# Patient Record
Sex: Male | Born: 1940 | Race: White | Hispanic: No | Marital: Married | Smoking: Former smoker
Health system: Southern US, Community
[De-identification: ages and names within clinical notes are randomized; demographics above are authoritative.]

## PROBLEM LIST (undated history)

## (undated) DIAGNOSIS — I10 Essential (primary) hypertension: Secondary | ICD-10-CM

## (undated) DIAGNOSIS — K635 Polyp of colon: Secondary | ICD-10-CM

## (undated) DIAGNOSIS — N400 Enlarged prostate without lower urinary tract symptoms: Secondary | ICD-10-CM

## (undated) DIAGNOSIS — R7309 Other abnormal glucose: Secondary | ICD-10-CM

## (undated) DIAGNOSIS — M51369 Other intervertebral disc degeneration, lumbar region without mention of lumbar back pain or lower extremity pain: Secondary | ICD-10-CM

## (undated) DIAGNOSIS — E559 Vitamin D deficiency, unspecified: Secondary | ICD-10-CM

## (undated) DIAGNOSIS — H409 Unspecified glaucoma: Secondary | ICD-10-CM

## (undated) HISTORY — DX: Essential (primary) hypertension: I10

## (undated) HISTORY — DX: Benign prostatic hyperplasia without lower urinary tract symptoms: N40.0

## (undated) HISTORY — PX: LAPAROSCOPIC APPENDECTOMY: SUR753

## (undated) HISTORY — DX: Other abnormal glucose: R73.09

## (undated) HISTORY — DX: Vitamin D deficiency, unspecified: E55.9

## (undated) HISTORY — PX: INGUINAL HERNIA REPAIR: SUR1180

## (undated) HISTORY — DX: Unspecified glaucoma: H40.9

## (undated) HISTORY — DX: Other intervertebral disc degeneration, lumbar region without mention of lumbar back pain or lower extremity pain: M51.369

## (undated) HISTORY — DX: Polyp of colon: K63.5

---

## 1997-07-31 HISTORY — PX: CARDIAC CATHETERIZATION: SHX172

## 1997-12-17 ENCOUNTER — Emergency Department (HOSPITAL_COMMUNITY): Admission: EM | Admit: 1997-12-17 | Discharge: 1997-12-17 | Payer: Self-pay | Admitting: Emergency Medicine

## 2000-03-22 ENCOUNTER — Ambulatory Visit (HOSPITAL_COMMUNITY): Admission: RE | Admit: 2000-03-22 | Discharge: 2000-03-22 | Payer: Self-pay | Admitting: *Deleted

## 2002-05-16 ENCOUNTER — Encounter: Payer: Self-pay | Admitting: Internal Medicine

## 2002-05-16 ENCOUNTER — Ambulatory Visit (HOSPITAL_COMMUNITY): Admission: RE | Admit: 2002-05-16 | Discharge: 2002-05-16 | Payer: Self-pay | Admitting: Internal Medicine

## 2005-09-11 ENCOUNTER — Emergency Department (HOSPITAL_COMMUNITY): Admission: EM | Admit: 2005-09-11 | Discharge: 2005-09-11 | Payer: Self-pay | Admitting: Emergency Medicine

## 2009-11-08 ENCOUNTER — Ambulatory Visit (HOSPITAL_COMMUNITY): Admission: RE | Admit: 2009-11-08 | Discharge: 2009-11-08 | Payer: Self-pay | Admitting: Internal Medicine

## 2009-12-06 ENCOUNTER — Ambulatory Visit (HOSPITAL_BASED_OUTPATIENT_CLINIC_OR_DEPARTMENT_OTHER): Admission: RE | Admit: 2009-12-06 | Discharge: 2009-12-06 | Payer: Self-pay | Admitting: Internal Medicine

## 2009-12-06 ENCOUNTER — Ambulatory Visit (HOSPITAL_COMMUNITY): Admission: EM | Admit: 2009-12-06 | Discharge: 2009-12-07 | Payer: Self-pay | Admitting: Emergency Medicine

## 2009-12-06 ENCOUNTER — Encounter (INDEPENDENT_AMBULATORY_CARE_PROVIDER_SITE_OTHER): Payer: Self-pay | Admitting: Surgery

## 2009-12-06 ENCOUNTER — Ambulatory Visit: Payer: Self-pay | Admitting: Diagnostic Radiology

## 2010-10-18 LAB — DIFFERENTIAL
Eosinophils Absolute: 0.1 10*3/uL (ref 0.0–0.7)
Lymphocytes Relative: 19 % (ref 12–46)
Lymphs Abs: 2.2 10*3/uL (ref 0.7–4.0)
Monocytes Relative: 8 % (ref 3–12)
Neutro Abs: 8.6 10*3/uL — ABNORMAL HIGH (ref 1.7–7.7)
Neutrophils Relative %: 72 % (ref 43–77)

## 2010-10-18 LAB — URINALYSIS, ROUTINE W REFLEX MICROSCOPIC
Bilirubin Urine: NEGATIVE
Hgb urine dipstick: NEGATIVE
Ketones, ur: NEGATIVE mg/dL
Nitrite: NEGATIVE
Specific Gravity, Urine: >1.046 — ABNORMAL HIGH (ref 1.005–1.030)
Urobilinogen, UA: 0.2 mg/dL (ref 0.0–1.0)
pH: 5.5 (ref 5.0–8.0)

## 2010-10-18 LAB — COMPREHENSIVE METABOLIC PANEL
CO2: 23 mEq/L (ref 19–32)
Calcium: 8.9 mg/dL (ref 8.4–10.5)
Creatinine, Ser: 1.03 mg/dL (ref 0.4–1.5)
GFR calc non Af Amer: >60 mL/min (ref >60)
Glucose, Bld: 91 mg/dL (ref 70–99)
Total Protein: 7.3 g/dL (ref 6.0–8.3)

## 2010-10-18 LAB — CBC
Hemoglobin: 14.5 g/dL (ref 13.0–17.0)
MCHC: 33.9 g/dL (ref 30.0–36.0)
MCV: 96.9 fL (ref 78.0–100.0)
RBC: 4.41 MIL/uL (ref 4.22–5.81)
RDW: 13.2 % (ref 11.5–15.5)

## 2010-12-20 ENCOUNTER — Encounter: Payer: Self-pay | Admitting: Cardiovascular Disease

## 2010-12-21 ENCOUNTER — Ambulatory Visit: Payer: Self-pay | Admitting: Cardiovascular Disease

## 2011-01-10 ENCOUNTER — Encounter: Payer: Self-pay | Admitting: Cardiovascular Disease

## 2011-01-10 ENCOUNTER — Ambulatory Visit (INDEPENDENT_AMBULATORY_CARE_PROVIDER_SITE_OTHER): Payer: Medicare Other | Admitting: Cardiovascular Disease

## 2011-01-10 VITALS — BP 128/80 | HR 80 | Ht 76.0 in | Wt 250.0 lb

## 2011-01-10 DIAGNOSIS — E782 Mixed hyperlipidemia: Secondary | ICD-10-CM

## 2011-01-10 DIAGNOSIS — I10 Essential (primary) hypertension: Secondary | ICD-10-CM

## 2011-01-10 DIAGNOSIS — R42 Dizziness and giddiness: Secondary | ICD-10-CM

## 2011-01-10 HISTORY — DX: Mixed hyperlipidemia: E78.2

## 2011-01-10 NOTE — Progress Notes (Signed)
70 yo referred by Dr Oneta Rack for dizzyness.  CRF;s HTN and elevated lipids on same Rx for years.  For about 2 months has noted dizzyness especially in a.m.  Not postural.  Has had longstanding "roaring,ringing" in ears.  Has not seen ENT.  Started on Meclizine with no obvious improvement.  No previous history of heart problems.  Home BP readings not low.  No associated SSCP, dyspnea, palpitations, or edema.  Hearing is unchanged.  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  No frank syncope. Compliant with meds.      ROS: Denies fever, malais, weight loss, blurry vision, decreased visual acuity, cough, sputum, SOB, hemoptysis, pleuritic pain, palpitaitons, heartburn, abdominal pain, melena, lower extremity edema, claudication, or rash.  All other systems reviewed and negative   General: Affect appropriate Healthy:  appears stated age HEENT: normal Neck supple with no adenopathy JVP normal no bruits no thyromegaly Lungs clear with no wheezing and good diaphragmatic motion Heart:  S1/S2 no murmur,rub, gallop or click PMI normal Abdomen: benighn, BS positve, no tenderness, no AAA no bruit.  No HSM or HJR Distal pulses intact with no bruits No edema Neuro non-focal Skin warm and dry No muscular weakness  Medications Current Outpatient Prescriptions  Medication Sig Dispense Refill  . aspirin 81 MG EC tablet Take 81 mg by mouth daily.        Marland Kitchen atorvastatin (LIPITOR) 20 MG tablet Take 20 mg by mouth daily.        . Cholecalciferol (VITAMIN D3) 10000 UNITS capsule Take 10,000 Units by mouth daily.        . enalapril (VASOTEC) 10 MG tablet Take 10 mg by mouth daily.        . magnesium oxide (MAG-OX) 400 MG tablet Take 400 mg by mouth daily.        . Multiple Vitamin (MULTIVITAMIN) tablet Take 1 tablet by mouth daily.        . Omega-3 Fatty Acids (FISH OIL) 1200 MG CAPS Take 1 capsule by mouth daily.        . vitamin C (ASCORBIC ACID) 500 MG tablet Take 500 mg by  mouth daily.        . vitamin E 400 UNIT capsule Take 400 Units by mouth daily.          Allergies Review of patient's allergies indicates not on file.  Family History: No family history on file.  Social History: History   Social History  . Marital Status: Married    Spouse Name: N/A    Number of Children: N/A  . Years of Education: N/A   Occupational History  . Not on file.   Social History Main Topics  . Smoking status: Never Smoker   . Smokeless tobacco: Not on file  . Alcohol Use: No  . Drug Use: No  . Sexually Active:    Other Topics Concern  . Not on file   Social History Narrative   NO FAMILY HISTORY IN [place]    Electrocardiogram:  08/09/10 NSR normal ECG rate 51  Assessment and Plan

## 2011-01-10 NOTE — Assessment & Plan Note (Signed)
Not postural.  No associated symptoms with normal ECG and exam.  Stress echo to make sure exercise hemodynamics are normal with no vasodepresser or chronotropic incompetence response.

## 2011-01-10 NOTE — Assessment & Plan Note (Signed)
Well Rx with two drug Rx.  See BP response to exercise

## 2011-01-10 NOTE — Assessment & Plan Note (Signed)
Cholesterol is at goal.  Continue current dose of statin and diet Rx.  No myalgias or side effects.  F/U  LFT's in 6 months. No results found for this basename: LDLCALC  Reviewed labs from primary.  LFTs normal and LDL 97 pm [id number]

## 2011-01-10 NOTE — Patient Instructions (Signed)
Your physician recommends that you schedule a follow-up appointment as needed with Dr. Eden Emms  Your physician has requested that you have a stress echocardiogram. For further information please visit [email or web address] Please follow instruction sheet as given.

## 2011-01-25 ENCOUNTER — Encounter: Payer: Self-pay | Admitting: Cardiovascular Disease

## 2011-01-27 ENCOUNTER — Ambulatory Visit (HOSPITAL_COMMUNITY): Payer: Medicare Other | Admitting: Radiology

## 2011-01-27 ENCOUNTER — Ambulatory Visit (HOSPITAL_COMMUNITY): Payer: Medicare Other | Attending: Cardiovascular Disease | Admitting: Radiology

## 2011-01-27 ENCOUNTER — Ambulatory Visit (HOSPITAL_BASED_OUTPATIENT_CLINIC_OR_DEPARTMENT_OTHER): Payer: Medicare Other | Admitting: Radiology

## 2011-01-27 DIAGNOSIS — R42 Dizziness and giddiness: Secondary | ICD-10-CM | POA: Insufficient documentation

## 2011-01-27 DIAGNOSIS — R0989 Other specified symptoms and signs involving the circulatory and respiratory systems: Secondary | ICD-10-CM

## 2011-01-27 DIAGNOSIS — R55 Syncope and collapse: Secondary | ICD-10-CM

## 2011-11-14 IMAGING — CR DG CERVICAL SPINE COMPLETE 4+V
6 series · 6 of 6 positions shown · non-contrast
Comparison: None.

CLINICAL DATA: Neck pain.  The patient reported to the technologist
that a benign right-sided neck mass was seen on MRI September 2008. No
prior MRIs of the neck are identified in our system for review.

CERVICAL SPINE - COMPLETE 4+ VIEW

[view not recorded (1 of 6)]
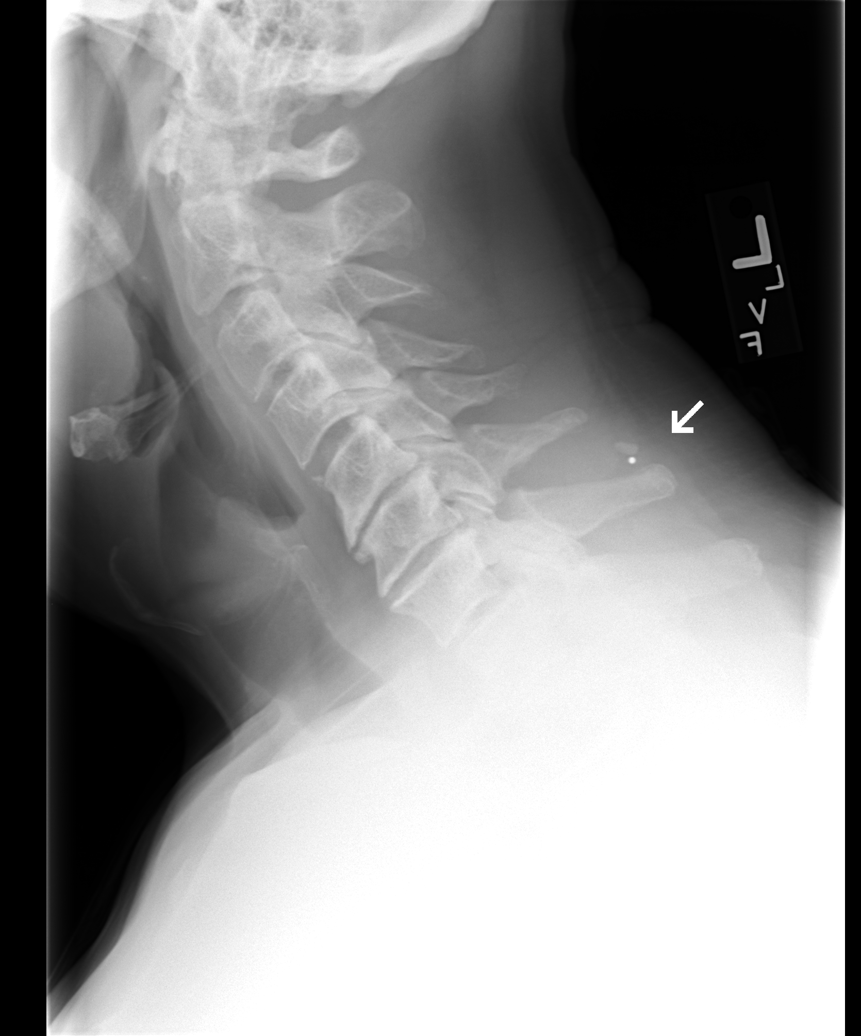

[view not recorded (2 of 6)]
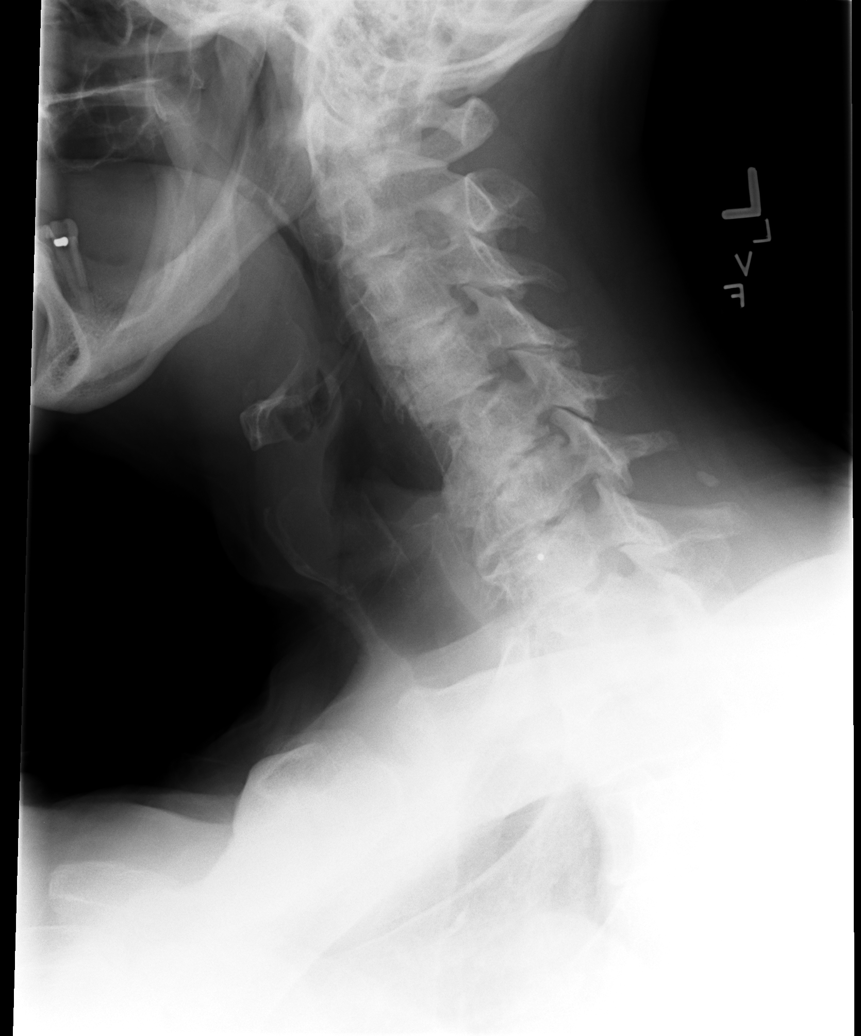

[view not recorded (3 of 6)]
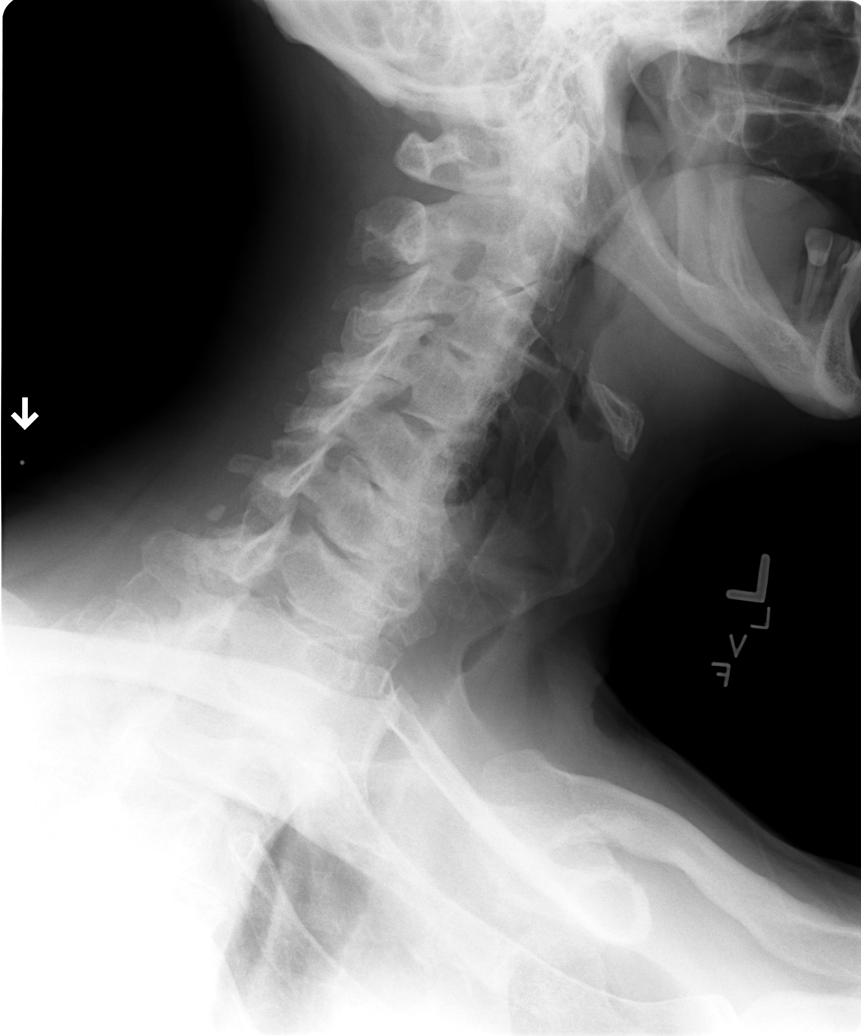

[view not recorded (4 of 6)]
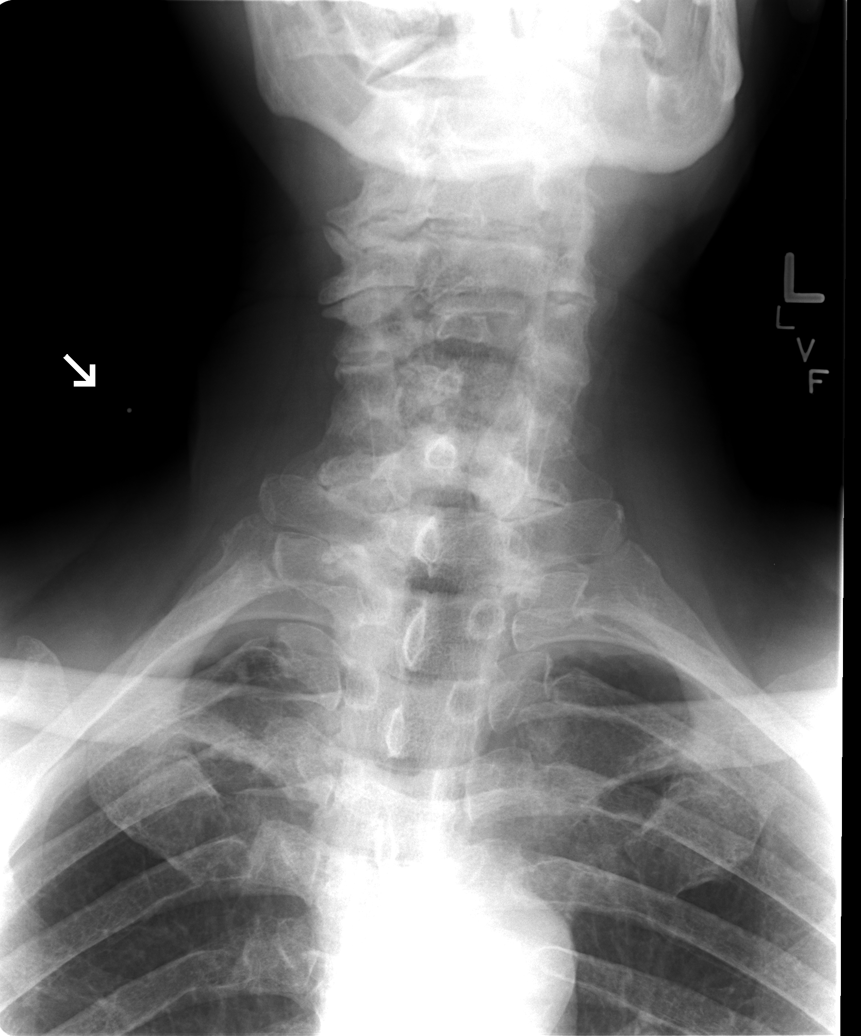

[view not recorded (5 of 6)]
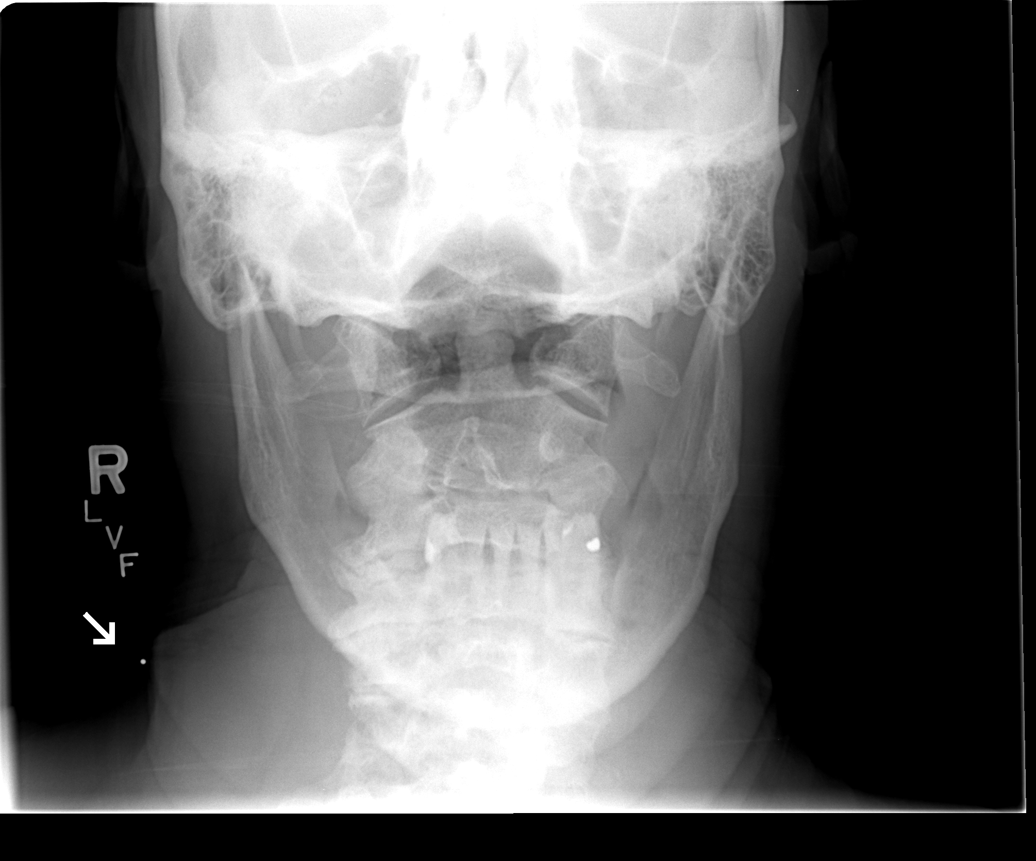

[view not recorded (6 of 6)]
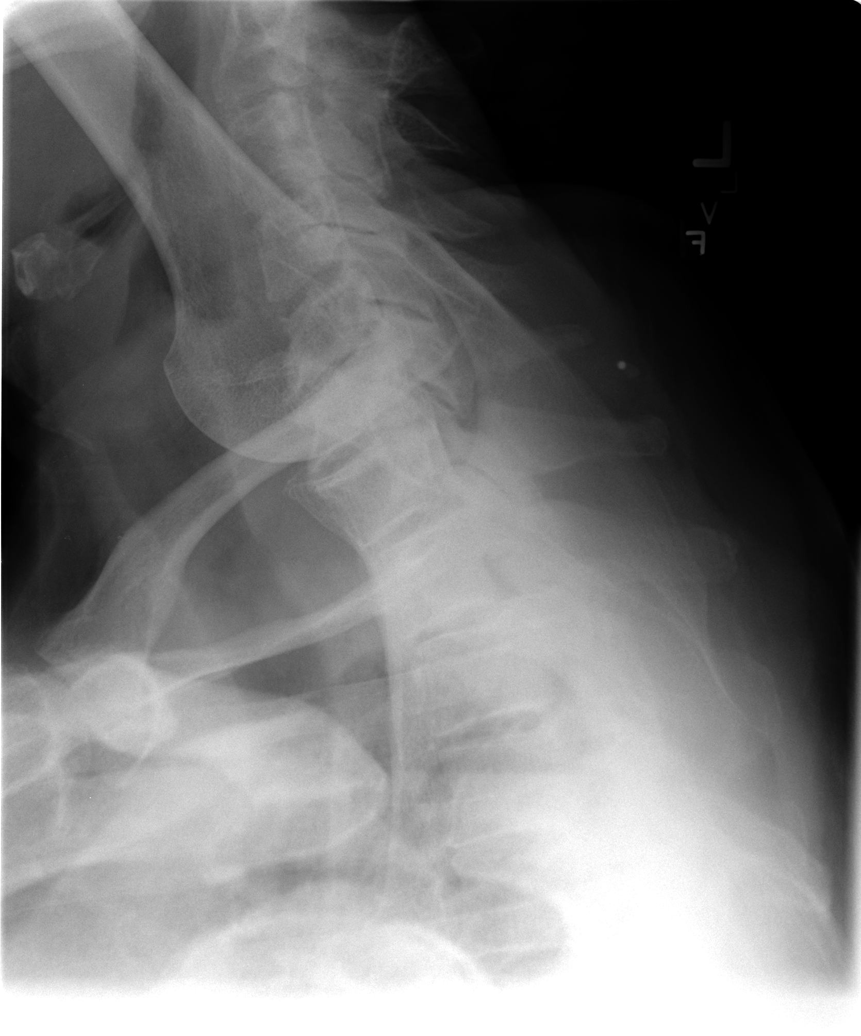

[6 of 6 positions shown; findings below may reference images not displayed]

FINDINGS: The technologist marked the site of patient's pain with a
metallic skin marker, on the right side of the neck.

The cervical spine vertebral bodies are normally aligned from the
skull base to the cervicothoracic junction.  Vertebral body heights
are preserved.

There is moderate disc space narrowing, with associated osteophyte
formation and endplate sclerosis at C5-6.  There is mild disc space
narrowing at C6-7.

There is moderate bony neural foraminal narrowing on the left at C5-
6. There may be neural foraminal narrowing on the right at the C3-
4, versus artifactual narrowing due to patient rotation.

A small oval smoothly corticated calcification is most noted within
the posterior soft tissues of the neck at the level of C6-7.

No acute bony abnormality is identified.  The prevertebral soft
tissue contour is normal.
IMPRESSION: 1.  Moderate degenerative disc disease at C5-6.  The right neural
foramen appears narrowed at this level.
2.  Mild disc space narrowing at C6-7.
3.  Possible but not definite neural foraminal narrowing on the
right at C3-4.

## 2012-05-16 ENCOUNTER — Other Ambulatory Visit (HOSPITAL_COMMUNITY): Payer: Self-pay | Admitting: Internal Medicine

## 2012-05-16 ENCOUNTER — Ambulatory Visit (HOSPITAL_COMMUNITY)
Admission: RE | Admit: 2012-05-16 | Discharge: 2012-05-16 | Disposition: A | Discharge status: Home / Self Care | Payer: Medicare Other | Source: Ambulatory Visit | Attending: Internal Medicine | Admitting: Internal Medicine

## 2012-05-16 DIAGNOSIS — M545 Low back pain, unspecified: Secondary | ICD-10-CM | POA: Insufficient documentation

## 2012-05-16 DIAGNOSIS — M51379 Other intervertebral disc degeneration, lumbosacral region without mention of lumbar back pain or lower extremity pain: Secondary | ICD-10-CM | POA: Insufficient documentation

## 2012-05-16 DIAGNOSIS — M5137 Other intervertebral disc degeneration, lumbosacral region: Secondary | ICD-10-CM | POA: Insufficient documentation

## 2013-06-19 ENCOUNTER — Encounter: Payer: Self-pay | Admitting: Internal Medicine

## 2013-06-19 DIAGNOSIS — E559 Vitamin D deficiency, unspecified: Secondary | ICD-10-CM | POA: Insufficient documentation

## 2013-06-19 DIAGNOSIS — M5136 Other intervertebral disc degeneration, lumbar region: Secondary | ICD-10-CM | POA: Insufficient documentation

## 2013-06-19 DIAGNOSIS — R7309 Other abnormal glucose: Secondary | ICD-10-CM | POA: Insufficient documentation

## 2013-06-19 DIAGNOSIS — I10 Essential (primary) hypertension: Secondary | ICD-10-CM | POA: Insufficient documentation

## 2013-06-19 DIAGNOSIS — N400 Enlarged prostate without lower urinary tract symptoms: Secondary | ICD-10-CM | POA: Insufficient documentation

## 2013-06-19 DIAGNOSIS — H409 Unspecified glaucoma: Secondary | ICD-10-CM | POA: Insufficient documentation

## 2013-06-20 ENCOUNTER — Ambulatory Visit: Payer: Medicare Other | Admitting: Emergency Medicine

## 2013-06-20 ENCOUNTER — Encounter: Payer: Self-pay | Admitting: Emergency Medicine

## 2013-06-20 VITALS — BP 110/70 | HR 60 | Temp 98.2°F | Resp 18 | Ht 75.75 in | Wt 245.0 lb

## 2013-06-20 DIAGNOSIS — R7309 Other abnormal glucose: Secondary | ICD-10-CM

## 2013-06-20 DIAGNOSIS — D649 Anemia, unspecified: Secondary | ICD-10-CM

## 2013-06-20 DIAGNOSIS — R5381 Other malaise: Secondary | ICD-10-CM

## 2013-06-20 DIAGNOSIS — E782 Mixed hyperlipidemia: Secondary | ICD-10-CM

## 2013-06-20 DIAGNOSIS — E559 Vitamin D deficiency, unspecified: Secondary | ICD-10-CM

## 2013-06-20 DIAGNOSIS — E538 Deficiency of other specified B group vitamins: Secondary | ICD-10-CM

## 2013-06-20 DIAGNOSIS — I10 Essential (primary) hypertension: Secondary | ICD-10-CM

## 2013-06-20 LAB — CBC WITH DIFFERENTIAL/PLATELET
Basophils Absolute: 0 10*3/uL (ref 0.0–0.1)
Basophils Relative: 1 % (ref 0–1)
Eosinophils Relative: 2 % (ref 0–5)
HCT: 44.5 % (ref 39.0–52.0)
MCHC: 36 g/dL (ref 30.0–36.0)
MCV: 93.1 fL (ref 78.0–100.0)
Monocytes Absolute: 0.6 10*3/uL (ref 0.1–1.0)
Neutro Abs: 3.4 10*3/uL (ref 1.7–7.7)
RDW: 13.6 % (ref 11.5–15.5)

## 2013-06-20 LAB — BASIC METABOLIC PANEL WITH GFR
CO2: 29 mEq/L (ref 19–32)
Chloride: 101 mEq/L (ref 96–112)
GFR, Est African American: 81 mL/min
Glucose, Bld: 100 mg/dL — ABNORMAL HIGH (ref 70–99)
Potassium: 4.8 mEq/L (ref 3.5–5.3)
Sodium: 139 mEq/L (ref 135–145)

## 2013-06-20 LAB — IRON AND TIBC: %SAT: 30 % (ref 20–55)

## 2013-06-20 LAB — HEPATIC FUNCTION PANEL
ALT: 26 U/L (ref 0–53)
Alkaline Phosphatase: 53 U/L (ref 39–117)
Bilirubin, Direct: 0.2 mg/dL (ref 0.0–0.3)
Indirect Bilirubin: 0.9 mg/dL (ref 0.0–0.9)

## 2013-06-20 LAB — LIPID PANEL
LDL Cholesterol: 95 mg/dL (ref 0–99)
Triglycerides: 76 mg/dL (ref <150)
VLDL: 15 mg/dL (ref 0–40)

## 2013-06-20 LAB — HEMOGLOBIN A1C: Hgb A1c MFr Bld: 5.6 % (ref <5.7)

## 2013-06-20 NOTE — Progress Notes (Signed)
Subjective:    Patient ID: Cody Hamilton, male    DOB: Oct 13, 1940, 72 y.o.   MRN: [id number]  HPI Comments: 72 yo male presents for 3 month F/U for HTN, Cholesterol, Pre-Dm, D. Deficient. He is doing well overall. He has notices his BP up a little 130s-140s over last week or so with out any change in diet or stress level. He is exercising regularly. He is eating healthy. He has also noticed his right LE has been cramping more. He notices the cramps in the morning and at qhs. 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 All labs were WNL at last OV except insulin elevated.   Hypertension  Hyperlipidemia Associated symptoms include myalgias.  Benign Prostatic Hypertrophy    Current Outpatient Prescriptions on File Prior to Visit  Medication Sig Dispense Refill  . aspirin 81 MG EC tablet Take 81 mg by mouth daily.        . Cholecalciferol (VITAMIN D3) 10000 UNITS capsule Take 10,000 Units by mouth daily.        . enalapril (VASOTEC) 20 MG tablet Take 20 mg by mouth daily.        . hydrochlorothiazide (HYDRODIURIL) 12.5 MG tablet Take 12.5 mg by mouth daily.        . magnesium oxide (MAG-OX) 400 MG tablet Take 400 mg by mouth 2 (two) times daily.       . Multiple Vitamin (MULTIVITAMIN) tablet Take 1 tablet by mouth daily.        . Omega-3 Fatty Acids (FISH OIL) 1200 MG CAPS Take 1 capsule by mouth 2 (two) times daily.       . pravastatin (PRAVACHOL) 20 MG tablet Take 20 mg by mouth daily.        . vitamin C (ASCORBIC ACID) 500 MG tablet Take 500 mg by mouth daily.        . vitamin E 400 UNIT capsule Take 400 Units by mouth daily.         No current facility-administered medications on file prior to visit.   ALLERGIES Viagra  Past Medical History  Diagnosis Date  . Colon polyps   . Inguinal hernia     RIGHT  . Hypertension   . Hyperlipidemia   . Elevated hemoglobin A1c   . Vitamin D deficiency   . BPH (benign  prostatic hypertrophy)   . Glaucoma   . DDD (degenerative disc disease)      Review of Systems  Constitutional: Positive for fatigue.       Occasional  Musculoskeletal: Positive for myalgias.  All other systems reviewed and are negative.    BP 110/70  Pulse 60  Temp(Src) 98.2 F (36.8 C) (Temporal)  Resp 18  Ht 6' 3.75" (1.924 m)  Wt 245 lb (111.131 kg)  BMI 30.02 kg/m2     Objective:   Physical Exam  Nursing note and vitals reviewed. Constitutional: He is oriented to person, place, and time. He appears well-developed.  HENT:  Head: Normocephalic and atraumatic.  Right Ear: External ear normal.  Left Ear: External ear normal.  Eyes: Conjunctivae are normal. Pupils are equal, round, and reactive to light.  Neck: Normal range of motion. Neck supple. No JVD present. No thyromegaly present.  Cardiovascular: Normal rate, regular rhythm, normal heart sounds and intact distal pulses.   vericose veins soft NT ND both LE  Pulmonary/Chest: Effort normal and breath sounds normal.  Abdominal: Soft. Bowel sounds are normal. He exhibits no distension. There is no tenderness.  Musculoskeletal: Normal range of motion.  Lymphadenopathy:    He has no cervical adenopathy.  Neurological: He is alert and oriented to person, place, and time.  Skin: Skin is warm and dry.  Psychiatric: He has a normal mood and affect. Judgment normal.          Assessment & Plan:  1.  3 month F/U for HTN, Cholesterol, Pre-Dm, D. Deficient recheck labs, continue healthy diet and exercise. If BP continues >130/80 call for Rx adjustment. Increase H2O 2. Leg cramps check labs

## 2013-06-20 NOTE — Patient Instructions (Signed)
Leg Cramps  Leg cramps that occur during exercise can be caused by poor circulation or dehydration. However, muscle cramps that occur at rest or during the night are usually not due to any serious medical problem. Heat cramps may cause muscle spasms during hot weather.   CAUSES  There is no clear cause for muscle cramps. However, dehydration may be a factor for those who do not drink enough fluids and those who exercise in the heat. Imbalances in the level of sodium, potassium, calcium or magnesium in the muscle tissue may also be a factor. Some medications, such as water pills (diuretics), may cause loss of chemicals that the body needs (like sodium and potassium) and cause muscle cramps.  TREATMENT   · Make sure your diet has enough fluids and essential minerals for the muscle to work normally.  · Avoid strenuous exercise for several days if you have been having frequent leg cramps.  · Stretch and massage the cramped muscle for several minutes.  · Some medicines may be helpful in some patients with night cramps. Only take over-the-counter or prescription medicines as directed by your caregiver.  SEEK IMMEDIATE MEDICAL CARE IF:   · Your leg cramps become worse.  · Your foot becomes cold, numb, or blue.  Document Released: 08/24/2004 Document Revised: 10/09/2011 Document Reviewed: 08/11/2008  [employer]® Patient Information ©2014 [employer].

## 2013-06-21 LAB — INSULIN, FASTING: Insulin fasting, serum: 31 u[IU]/mL — ABNORMAL HIGH (ref 3–28)

## 2013-07-08 ENCOUNTER — Encounter: Payer: Self-pay | Admitting: Cardiovascular Disease

## 2013-08-25 ENCOUNTER — Encounter: Payer: Self-pay | Admitting: Internal Medicine

## 2013-08-25 ENCOUNTER — Ambulatory Visit (INDEPENDENT_AMBULATORY_CARE_PROVIDER_SITE_OTHER): Payer: Medicare Other | Admitting: Internal Medicine

## 2013-08-25 VITALS — BP 126/86 | HR 64 | Temp 97.9°F | Resp 18 | Ht 75.5 in | Wt 248.8 lb

## 2013-08-25 DIAGNOSIS — Z79899 Other long term (current) drug therapy: Secondary | ICD-10-CM

## 2013-08-25 DIAGNOSIS — E782 Mixed hyperlipidemia: Secondary | ICD-10-CM

## 2013-08-25 DIAGNOSIS — R7309 Other abnormal glucose: Secondary | ICD-10-CM

## 2013-08-25 DIAGNOSIS — Z1212 Encounter for screening for malignant neoplasm of rectum: Secondary | ICD-10-CM

## 2013-08-25 DIAGNOSIS — E559 Vitamin D deficiency, unspecified: Secondary | ICD-10-CM

## 2013-08-25 DIAGNOSIS — I10 Essential (primary) hypertension: Secondary | ICD-10-CM

## 2013-08-25 DIAGNOSIS — Z0000 Encounter for general adult medical examination without abnormal findings: Secondary | ICD-10-CM

## 2013-08-25 DIAGNOSIS — Z125 Encounter for screening for malignant neoplasm of prostate: Secondary | ICD-10-CM

## 2013-08-25 DIAGNOSIS — F411 Generalized anxiety disorder: Secondary | ICD-10-CM

## 2013-08-25 LAB — HEPATIC FUNCTION PANEL
ALK PHOS: 53 U/L (ref 39–117)
ALT: 22 U/L (ref 0–53)
AST: 19 U/L (ref 0–37)
Albumin: 4.6 g/dL (ref 3.5–5.2)
BILIRUBIN DIRECT: 0.2 mg/dL (ref 0.0–0.3)
BILIRUBIN INDIRECT: 0.7 mg/dL (ref 0.0–0.9)
Total Bilirubin: 0.9 mg/dL (ref 0.3–1.2)
Total Protein: 6.8 g/dL (ref 6.0–8.3)

## 2013-08-25 LAB — HEMOGLOBIN A1C
Hgb A1c MFr Bld: 5.7 % — ABNORMAL HIGH (ref <5.7)
MEAN PLASMA GLUCOSE: 117 mg/dL — AB (ref <117)

## 2013-08-25 LAB — BASIC METABOLIC PANEL WITH GFR
BUN: 18 mg/dL (ref 6–23)
CHLORIDE: 102 meq/L (ref 96–112)
CO2: 29 meq/L (ref 19–32)
CREATININE: 1.02 mg/dL (ref 0.50–1.35)
Calcium: 9.3 mg/dL (ref 8.4–10.5)
GFR, Est African American: 85 mL/min
GFR, Est Non African American: 74 mL/min
GLUCOSE: 93 mg/dL (ref 70–99)
POTASSIUM: 4.3 meq/L (ref 3.5–5.3)
Sodium: 139 mEq/L (ref 135–145)

## 2013-08-25 LAB — LIPID PANEL
CHOLESTEROL: 164 mg/dL (ref 0–200)
HDL: 46 mg/dL (ref >39)
LDL CALC: 95 mg/dL (ref 0–99)
TRIGLYCERIDES: 114 mg/dL (ref <150)
Total CHOL/HDL Ratio: 3.6 Ratio
VLDL: 23 mg/dL (ref 0–40)

## 2013-08-25 LAB — CBC WITH DIFFERENTIAL/PLATELET
BASOS ABS: 0 10*3/uL (ref 0.0–0.1)
BASOS PCT: 0 % (ref 0–1)
EOS PCT: 2 % (ref 0–5)
Eosinophils Absolute: 0.2 10*3/uL (ref 0.0–0.7)
HCT: 44.2 % (ref 39.0–52.0)
HEMOGLOBIN: 15.7 g/dL (ref 13.0–17.0)
Lymphocytes Relative: 40 % (ref 12–46)
Lymphs Abs: 2.5 10*3/uL (ref 0.7–4.0)
MCH: 32.7 pg (ref 26.0–34.0)
MCHC: 35.5 g/dL (ref 30.0–36.0)
MCV: 92.1 fL (ref 78.0–100.0)
Monocytes Absolute: 0.5 10*3/uL (ref 0.1–1.0)
Monocytes Relative: 8 % (ref 3–12)
Neutro Abs: 3.1 10*3/uL (ref 1.7–7.7)
Neutrophils Relative %: 50 % (ref 43–77)
Platelets: 160 10*3/uL (ref 150–400)
RBC: 4.8 MIL/uL (ref 4.22–5.81)
RDW: 13.6 % (ref 11.5–15.5)
WBC: 6.3 10*3/uL (ref 4.0–10.5)

## 2013-08-25 LAB — TSH: TSH: 2.684 u[IU]/mL (ref 0.350–4.500)

## 2013-08-25 LAB — MAGNESIUM: Magnesium: 1.9 mg/dL (ref 1.5–2.5)

## 2013-08-25 LAB — PSA: PSA: 0.52 ng/mL (ref <=4.00)

## 2013-08-25 MED ORDER — ALPRAZOLAM 1 MG PO TABS: ORAL_TABLET | ORAL | Status: DC

## 2013-08-25 NOTE — Patient Instructions (Signed)

## 2013-08-25 NOTE — Progress Notes (Signed)
Patient ID: Cody Hamilton, male   DOB: 11/13/1940, 73 y.o.   MRN: [id number]  Annual Screening Comprehensive Examination  This very nice 73 y.o.  MWM presents for complete physical.  Patient has been followed for HTN, Diabetes  Prediabetes, Hyperlipidemia, and Vitamin D Deficiency.   HTN predates since 29. Patient's BP has been controlled at home.Today's BP: 126/86 mmHg. Patient denies any cardiac symptoms as chest pain, palpitations, shortness of breath, dizziness or ankle swelling.   Patient's hyperlipidemia is controlled with diet and medications. Patient denies myalgias or other medication SE's. Last cholesterol last visit was 162, triglycerides 72, HDL 54 and LDL 94 in July all at goal..     Patient has prediabetes with A1c of 5.7% in 2011, 5.8% in 2012 and 5.6 with elevated Insulin in Oct 2013.and  with last A1c 5.4%  / insulin 33 in July 2014. Patient denies reactive hypoglycemic symptoms, visual blurring, diabetic polys, or paresthesias.    Finally, patient has history of Vitamin D Deficiency of 34 in 2008 with last vitamin D of 86 in July 2014       Medication List       This list is accurate as of: 08/25/13  2:13 PM.  Always use your most recent med list.               ALPRAZolam 1 MG tablet  Commonly known as:  XANAX  1/2 to 1 tablet 3 x daily as needed for anxiety or sleep     aspirin 81 MG EC tablet  Take 81 mg by mouth daily.     enalapril 20 MG tablet  Commonly known as:  VASOTEC  Take 20 mg by mouth daily.     Fish Oil 1200 MG Caps  Take 1 capsule by mouth 2 (two) times daily.     hydrochlorothiazide 12.5 MG tablet  Commonly known as:  HYDRODIURIL  Take 12.5 mg by mouth daily.     magnesium oxide 400 MG tablet  Commonly known as:  MAG-OX  Take 400 mg by mouth 2 (two) times daily.     multivitamin tablet  Take 1 tablet by mouth daily.     pravastatin 20 MG tablet  Commonly known as:  PRAVACHOL  Take 20 mg by mouth daily.     vitamin C 500 MG  tablet  Commonly known as:  ASCORBIC ACID  Take 500 mg by mouth daily.     Vitamin D3 10000 UNITS capsule  Take 10,000 Units by mouth daily.     vitamin E 400 UNIT capsule  Take 400 Units by mouth daily.        Allergies  Allergen Reactions  . Viagra [Sildenafil Citrate]     Headache    Past Medical History  Diagnosis Date  . Colon polyps   . Inguinal hernia     RIGHT  . Hypertension   . Hyperlipidemia   . Elevated hemoglobin A1c   . Vitamin D deficiency   . BPH (benign prostatic hypertrophy)   . Glaucoma   . DDD (degenerative disc disease)     Past Surgical History  Procedure Laterality Date  . Inguinal hernia repair    . Laparoscopic appendectomy    . Cardiac catheterization  1999    Family History  Problem Relation Age of Onset  . Hypertension Mother   . Alzheimer's disease Mother   . Hyperlipidemia Father   . Hypertension Father   . Cancer Father     esophagus  .  Lymphoma Father   . Hypertension Sister   . Hyperlipidemia Sister     History   Social History  . Marital Status: Married    Spouse Name: N/A    Number of Children: N/A  . Years of Education: N/A   Occupational History  . Not on file.   Social History Main Topics  . Smoking status: Former Smoker    Quit date: 07/31/2001  . Smokeless tobacco: Not on file  . Alcohol Use: No  . Drug Use: No  . Sexual Activity: Not on file   Other Topics Concern  . Not on file   Social History Narrative   NO FAMILY HISTORY IN [place]    ROS Constitutional: Denies fever, chills, weight loss/gain, headaches, insomnia, fatigue, night sweats, and change in appetite. Eyes: Denies redness, blurred vision, diplopia, discharge, itchy, watery eyes.  ENT: Denies discharge, congestion, post nasal drip, epistaxis, sore throat, earache, hearing loss, dental pain, Tinnitus, Vertigo, Sinus pain, snoring.  Cardio: Denies chest pain, palpitations, irregular heartbeat, syncope, dyspnea, diaphoresis, orthopnea,  PND, claudication, edema Respiratory: denies cough, dyspnea, DOE, pleurisy, hoarseness, laryngitis, wheezing.  Gastrointestinal: Denies dysphagia, heartburn, reflux, water brash, pain, cramps, nausea, vomiting, bloating, diarrhea, constipation, hematemesis, melena, hematochezia, jaundice, hemorrhoids Genitourinary: Denies dysuria, frequency, urgency, nocturia, hesitancy, discharge, hematuria, flank pain Musculoskeletal: Denies arthralgia, myalgia, stiffness, Jt. Swelling, pain, limp, and strain/sprain. Skin: Denies puritis, rash, hives, warts, acne, eczema, changing in skin lesion Neuro: No weakness, tremor, incoordination, spasms, paresthesia, pain Psychiatric: Denies confusion, memory loss, sensory loss Endocrine: Denies change in weight, skin, hair change, nocturia, and paresthesia, diabetic polys, visual blurring, hyper / hypo glycemic episodes.  Heme/Lymph: No excessive bleeding, bruising, or elarged lymph nodes.  Filed Vitals:   08/25/13 0940  BP: 126/86  Pulse: 64  Temp: 97.9 F (36.6 C)  Resp: 18    Estimated body mass index is 30.68 kg/(m^2) as calculated from the following:   Height as of this encounter: 6' 3.5" (1.918 m).   Weight as of this encounter: 248 lb 12.8 oz (112.855 kg).  Physical Exam General Appearance: Well nourished, in no apparent distress. Eyes: PERRLA, EOMs, conjunctiva no swelling or erythema, normal fundi and vessels. Sinuses: No frontal/maxillary tenderness ENT/Mouth: EACs patent / TMs  nl. Nares clear without erythema, swelling, mucoid exudates. Oral hygiene is good. No erythema, swelling, or exudate. Tongue normal, non-obstructing. Tonsils not swollen or erythematous. Hearing normal.  Neck: Supple, thyroid normal. No bruits, nodes or JVD. Respiratory: Respiratory effort normal.  BS equal and clear bilateral without rales, rhonci, wheezing or stridor. Cardio: Heart sounds are normal with regular rate and rhythm and no murmurs, rubs or gallops. Peripheral  pulses are normal and equal bilaterally without edema. No aortic or femoral bruits. Chest: symmetric with normal excursions and percussion.  Abdomen: Flat, soft, with bowl sounds. Nontender, no guarding, rebound, hernias, masses, or organomegaly.  Lymphatics: Non tender without lymphadenopathy.  Genitourinary: No hernias.Testes nl. DRE - prostate nl for age - smooth & firm w/o nodules. Musculoskeletal: Full ROM all peripheral extremities, joint stability, 5/5 strength, and normal gait. Skin: Warm and dry without rashes, lesions, cyanosis, clubbing or  ecchymosis.  Neuro: Cranial nerves intact, reflexes equal bilaterally. Normal muscle tone, no cerebellar symptoms. Sensation intact.  Pysch: Awake and oriented X 3, normal affect, insight and judgment appropriate.   Assessment and Plan  1. Annual Screening Examination 2. Hypertension  3. Hyperlipidemia 4. Pre Diabetes 5. Vitamin D Deficiency 6. Obesity   Continue prudent diet as discussed,  weight control, BP monitoring, regular exercise, and medications as discussed.  Discussed med effects and SE's. Routine screening labs and tests as requested with regular follow-up as recommended.

## 2013-08-26 LAB — URINALYSIS, MICROSCOPIC ONLY
BACTERIA UA: NONE SEEN
CRYSTALS: NONE SEEN
Casts: NONE SEEN
Squamous Epithelial / LPF: NONE SEEN

## 2013-08-26 LAB — VITAMIN D 25 HYDROXY (VIT D DEFICIENCY, FRACTURES): Vit D, 25-Hydroxy: 96 ng/mL — ABNORMAL HIGH (ref 30–89)

## 2013-08-26 LAB — MICROALBUMIN / CREATININE URINE RATIO
CREATININE, URINE: 128.7 mg/dL
MICROALB UR: 0.5 mg/dL (ref 0.00–1.89)
MICROALB/CREAT RATIO: 3.9 mg/g (ref 0.0–30.0)

## 2013-08-26 LAB — INSULIN, FASTING: Insulin fasting, serum: 32 u[IU]/mL — ABNORMAL HIGH (ref 3–28)

## 2013-09-11 ENCOUNTER — Other Ambulatory Visit (INDEPENDENT_AMBULATORY_CARE_PROVIDER_SITE_OTHER): Payer: Medicare Other | Admitting: *Deleted

## 2013-09-11 DIAGNOSIS — Z1212 Encounter for screening for malignant neoplasm of rectum: Secondary | ICD-10-CM

## 2013-09-11 LAB — POC HEMOCCULT BLD/STL (HOME/3-CARD/SCREEN)
Card #2 Fecal Occult Blod, POC: NEGATIVE
FECAL OCCULT BLD: NEGATIVE
FECAL OCCULT BLD: NEGATIVE

## 2013-11-19 ENCOUNTER — Encounter (HOSPITAL_COMMUNITY): Payer: Self-pay | Admitting: Emergency Medicine

## 2013-11-19 ENCOUNTER — Emergency Department (HOSPITAL_COMMUNITY)
Admission: EM | Admit: 2013-11-19 | Discharge: 2013-11-20 | Disposition: A | Discharge status: Home / Self Care | Payer: Medicare Other | Attending: Emergency Medicine | Admitting: Emergency Medicine

## 2013-11-19 DIAGNOSIS — S0003XA Contusion of scalp, initial encounter: Secondary | ICD-10-CM | POA: Insufficient documentation

## 2013-11-19 DIAGNOSIS — S01309A Unspecified open wound of unspecified ear, initial encounter: Secondary | ICD-10-CM | POA: Insufficient documentation

## 2013-11-19 DIAGNOSIS — I10 Essential (primary) hypertension: Secondary | ICD-10-CM | POA: Insufficient documentation

## 2013-11-19 DIAGNOSIS — Z860100 Personal history of colon polyps, unspecified: Secondary | ICD-10-CM | POA: Insufficient documentation

## 2013-11-19 DIAGNOSIS — Z79899 Other long term (current) drug therapy: Secondary | ICD-10-CM | POA: Insufficient documentation

## 2013-11-19 DIAGNOSIS — S46909A Unspecified injury of unspecified muscle, fascia and tendon at shoulder and upper arm level, unspecified arm, initial encounter: Secondary | ICD-10-CM | POA: Insufficient documentation

## 2013-11-19 DIAGNOSIS — Z87448 Personal history of other diseases of urinary system: Secondary | ICD-10-CM | POA: Insufficient documentation

## 2013-11-19 DIAGNOSIS — S1190XA Unspecified open wound of unspecified part of neck, initial encounter: Secondary | ICD-10-CM | POA: Insufficient documentation

## 2013-11-19 DIAGNOSIS — S4980XA Other specified injuries of shoulder and upper arm, unspecified arm, initial encounter: Secondary | ICD-10-CM | POA: Insufficient documentation

## 2013-11-19 DIAGNOSIS — S1093XA Contusion of unspecified part of neck, initial encounter: Secondary | ICD-10-CM

## 2013-11-19 DIAGNOSIS — W19XXXA Unspecified fall, initial encounter: Secondary | ICD-10-CM

## 2013-11-19 DIAGNOSIS — E785 Hyperlipidemia, unspecified: Secondary | ICD-10-CM | POA: Insufficient documentation

## 2013-11-19 DIAGNOSIS — Z8739 Personal history of other diseases of the musculoskeletal system and connective tissue: Secondary | ICD-10-CM | POA: Insufficient documentation

## 2013-11-19 DIAGNOSIS — Z87891 Personal history of nicotine dependence: Secondary | ICD-10-CM | POA: Insufficient documentation

## 2013-11-19 DIAGNOSIS — E559 Vitamin D deficiency, unspecified: Secondary | ICD-10-CM | POA: Insufficient documentation

## 2013-11-19 DIAGNOSIS — W06XXXA Fall from bed, initial encounter: Secondary | ICD-10-CM | POA: Insufficient documentation

## 2013-11-19 DIAGNOSIS — W1809XA Striking against other object with subsequent fall, initial encounter: Secondary | ICD-10-CM | POA: Insufficient documentation

## 2013-11-19 DIAGNOSIS — Z7982 Long term (current) use of aspirin: Secondary | ICD-10-CM | POA: Insufficient documentation

## 2013-11-19 DIAGNOSIS — Z8719 Personal history of other diseases of the digestive system: Secondary | ICD-10-CM | POA: Insufficient documentation

## 2013-11-19 DIAGNOSIS — S01312A Laceration without foreign body of left ear, initial encounter: Secondary | ICD-10-CM

## 2013-11-19 DIAGNOSIS — Z9889 Other specified postprocedural states: Secondary | ICD-10-CM | POA: Insufficient documentation

## 2013-11-19 DIAGNOSIS — Y929 Unspecified place or not applicable: Secondary | ICD-10-CM | POA: Insufficient documentation

## 2013-11-19 DIAGNOSIS — Y9389 Activity, other specified: Secondary | ICD-10-CM | POA: Insufficient documentation

## 2013-11-19 DIAGNOSIS — Z8669 Personal history of other diseases of the nervous system and sense organs: Secondary | ICD-10-CM | POA: Insufficient documentation

## 2013-11-19 DIAGNOSIS — S0083XA Contusion of other part of head, initial encounter: Secondary | ICD-10-CM | POA: Insufficient documentation

## 2013-11-19 NOTE — ED Provider Notes (Signed)
CSN: [id number]     Arrival date & time 11/19/13  2306 History  This chart was scribed for non-physician practitioner Delos Haring, PA-C working with Varney Biles, MD by Zettie Pho, ED Scribe. This patient was seen in room TR07C/TR07C and the patient's care was started at 12:20 AM.    Chief Complaint  Patient presents with  . Fall   The history is provided by the patient and the spouse. No language interpreter was used.   HPI Comments: Cody Hamilton is a 73 y.o. male with a history of degenerative disc disease and vitamin D deficiency who presents to the Emergency Department complaining of a fall from bed that occurred a few hours ago. His wife reports that the patient rolled over, causing him to fall and hit his head and neck on the nightstand. He states that he has not been sleeping well over the past 2 weeks, so he took a Xanax to help him sleep. Patient presents to the ED with a small laceration to the posterior neck with an area of ecchymosis to the left side of the face. Patient takes 81 mg aspirin daily, but denies other anticoagulant medications. Patient is also complaining of a constant, gradual onset pain to the neck and bilateral shoulders secondary to the incident. His wife reports that the patient has been acting normally for his baseline. Patient has a history of HTN, hyperlipidemia.   Past Medical History  Diagnosis Date  . Colon polyps   . Inguinal hernia     RIGHT  . Hypertension   . Hyperlipidemia   . Elevated hemoglobin A1c   . Vitamin D deficiency   . BPH (benign prostatic hypertrophy)   . Glaucoma   . DDD (degenerative disc disease)    Past Surgical History  Procedure Laterality Date  . Inguinal hernia repair    . Laparoscopic appendectomy    . Cardiac catheterization  1999   Family History  Problem Relation Age of Onset  . Hypertension Mother   . Alzheimer's disease Mother   . Hyperlipidemia Father   . Hypertension Father   . Cancer Father      esophagus  . Lymphoma Father   . Hypertension Sister   . Hyperlipidemia Sister    History  Substance Use Topics  . Smoking status: Former Smoker    Quit date: 07/31/2001  . Smokeless tobacco: Not on file  . Alcohol Use: No    Review of Systems  Musculoskeletal: Positive for arthralgias and neck pain.  Skin: Positive for wound (laceration). Color change: ecchymosis.  All other systems reviewed and are negative.     Allergies  Viagra  Home Medications   Prior to Admission medications   Medication Sig Start Date End Date Taking? Authorizing Provider  ALPRAZolam Duanne Moron) 1 MG tablet 1/2 to 1 tablet 3 x daily as needed for anxiety or sleep 08/25/13   Unk Pinto, MD  aspirin 81 MG EC tablet Take 81 mg by mouth daily.      Historical Provider, MD  Cholecalciferol (VITAMIN D3) 10000 UNITS capsule Take 10,000 Units by mouth daily.      Historical Provider, MD  enalapril (VASOTEC) 20 MG tablet Take 20 mg by mouth daily.      Historical Provider, MD  hydrochlorothiazide (HYDRODIURIL) 12.5 MG tablet Take 12.5 mg by mouth daily.      Historical Provider, MD  magnesium oxide (MAG-OX) 400 MG tablet Take 400 mg by mouth 2 (two) times daily.  Historical Provider, MD  Multiple Vitamin (MULTIVITAMIN) tablet Take 1 tablet by mouth daily.      Historical Provider, MD  Omega-3 Fatty Acids (FISH OIL) 1200 MG CAPS Take 1 capsule by mouth 2 (two) times daily.     Historical Provider, MD  pravastatin (PRAVACHOL) 20 MG tablet Take 20 mg by mouth daily.      Historical Provider, MD  vitamin C (ASCORBIC ACID) 500 MG tablet Take 500 mg by mouth daily.      Historical Provider, MD  vitamin E 400 UNIT capsule Take 400 Units by mouth daily.      Historical Provider, MD   Triage Vitals: BP 134/81  Pulse 62  Temp(Src) 98.1 F (36.7 C) (Oral)  Resp 16  Ht 6\' 4"  (1.93 m)  Wt 245 lb (111.131 kg)  BMI 29.83 kg/m2  SpO2 100%  Physical Exam  Nursing note and vitals reviewed. Constitutional: He is  oriented to person, place, and time. He appears well-developed and well-nourished. No distress.  HENT:  Head: Normocephalic and atraumatic.  Left Ear: Left ear exhibits lacerations.  Ears:  Eyes: Conjunctivae and EOM are normal. Pupils are equal, round, and reactive to light.  Neck: Normal range of motion. Neck supple.  Full range of motion without pain. No midline or paraspinal tenderness.   Pulmonary/Chest: Effort normal. No respiratory distress.  Abdominal: He exhibits no distension.  Musculoskeletal: Normal range of motion.  Neurological: He is alert and oriented to person, place, and time. He has normal strength. No cranial nerve deficit or sensory deficit. He displays a negative Romberg sign. GCS eye subscore is 4. GCS verbal subscore is 5. GCS motor subscore is 6.  Skin: Skin is warm and dry.  Psychiatric: He has a normal mood and affect. His behavior is normal.    ED Course  Procedures (including critical care time)  DIAGNOSTIC STUDIES: Oxygen Saturation is 100% on room air, normal by my interpretation.    COORDINATION OF CARE: 12:24 AM- Will order a head CT. Will repair the laceration with sutures. Discussed treatment plan with patient at bedside and patient verbalized agreement.   LACERATION REPAIR PROCEDURE NOTE The patient's identification was confirmed and consent was obtained. This procedure was performed by Delos Haring, PA-C at 1:44 AM. Site: left posterior ear Sterile procedures observed yes Anesthetic used (type and amt): local lidocaine without epi Suture type/size: prolene Length:4 cm # of Sutures: 3 Technique:simple interrupted Complexitycomplex Antibx ointment applied: bacitracin Tetanus UTD or ordered: no, UTD Site anesthetized, irrigated with NS, explored without evidence of foreign body, wound well approximated, site covered with dry, sterile dressing.  Patient tolerated procedure well without complications. Instructions for care discussed verbally  and patient provided with additional written instructions for homecare and f/u.   Labs Review Labs Reviewed - No data to display  Imaging Review Ct Head Wo Contrast  11/20/2013   CLINICAL DATA:  Fall  EXAM: CT HEAD WITHOUT CONTRAST  CT MAXILLOFACIAL WITHOUT CONTRAST  CT CERVICAL SPINE WITHOUT CONTRAST  TECHNIQUE: Multidetector CT imaging of the head, cervical spine, and maxillofacial structures were performed using the standard protocol without intravenous contrast. Multiplanar CT image reconstructions of the cervical spine and maxillofacial structures were also generated.  COMPARISON:  None.  FINDINGS: CT HEAD FINDINGS  No mass effect, midline shift, or acute intracranial hemorrhage. Chronic ischemic changes. Mild global atrophy. Minimal encephalomalacia in the lateral right frontal white matter. Cranium is intact  CT MAXILLOFACIAL FINDINGS  No evidence of acute facial bone fracture  there is mucosal thickening and inflammatory change in the right ethmoid air cells. Mild mucosal thickening in the right maxillary sinus and right sphenoid sinus. No evidence of orbital hemorrhage.  CT CERVICAL SPINE FINDINGS  No acute fracture. No dislocation. Severe narrowing at C5-6 and C6-7. There is some posterior osteophytic ridging  IMPRESSION: No acute intracranial pathology.  No evidence of acute facial bone injury.  No acute cervical spine injury.   Electronically Signed   By: Maryclare Bean M.D.   On: 11/20/2013 01:38   Ct Cervical Spine Wo Contrast  11/20/2013   CLINICAL DATA:  Fall  EXAM: CT HEAD WITHOUT CONTRAST  CT MAXILLOFACIAL WITHOUT CONTRAST  CT CERVICAL SPINE WITHOUT CONTRAST  TECHNIQUE: Multidetector CT imaging of the head, cervical spine, and maxillofacial structures were performed using the standard protocol without intravenous contrast. Multiplanar CT image reconstructions of the cervical spine and maxillofacial structures were also generated.  COMPARISON:  None.  FINDINGS: CT HEAD FINDINGS  No mass  effect, midline shift, or acute intracranial hemorrhage. Chronic ischemic changes. Mild global atrophy. Minimal encephalomalacia in the lateral right frontal white matter. Cranium is intact  CT MAXILLOFACIAL FINDINGS  No evidence of acute facial bone fracture there is mucosal thickening and inflammatory change in the right ethmoid air cells. Mild mucosal thickening in the right maxillary sinus and right sphenoid sinus. No evidence of orbital hemorrhage.  CT CERVICAL SPINE FINDINGS  No acute fracture. No dislocation. Severe narrowing at C5-6 and C6-7. There is some posterior osteophytic ridging  IMPRESSION: No acute intracranial pathology.  No evidence of acute facial bone injury.  No acute cervical spine injury.   Electronically Signed   By: Maryclare Bean M.D.   On: 11/20/2013 01:38   Ct Maxillofacial Wo Cm  11/20/2013   CLINICAL DATA:  Fall  EXAM: CT HEAD WITHOUT CONTRAST  CT MAXILLOFACIAL WITHOUT CONTRAST  CT CERVICAL SPINE WITHOUT CONTRAST  TECHNIQUE: Multidetector CT imaging of the head, cervical spine, and maxillofacial structures were performed using the standard protocol without intravenous contrast. Multiplanar CT image reconstructions of the cervical spine and maxillofacial structures were also generated.  COMPARISON:  None.  FINDINGS: CT HEAD FINDINGS  No mass effect, midline shift, or acute intracranial hemorrhage. Chronic ischemic changes. Mild global atrophy. Minimal encephalomalacia in the lateral right frontal white matter. Cranium is intact  CT MAXILLOFACIAL FINDINGS  No evidence of acute facial bone fracture there is mucosal thickening and inflammatory change in the right ethmoid air cells. Mild mucosal thickening in the right maxillary sinus and right sphenoid sinus. No evidence of orbital hemorrhage.  CT CERVICAL SPINE FINDINGS  No acute fracture. No dislocation. Severe narrowing at C5-6 and C6-7. There is some posterior osteophytic ridging  IMPRESSION: No acute intracranial pathology.  No evidence  of acute facial bone injury.  No acute cervical spine injury.   Electronically Signed   By: Maryclare Bean M.D.   On: 11/20/2013 01:38     EKG Interpretation None      MDM   Final diagnoses:  Fall  Laceration of left ear  Facial contusion    Complex ear laceration, Dr. Kathrynn Humble saw patient as well and recommends I approximate the edges with approx 3 sutures. Will give referral to plastics for follow-up if the patient prefers or if complications arise. Otherwise, the patient can have sutures removed by PCP in 7 days.  Her head, neck and maxfac CT are both unremarkable.  73 y.o.Reginold Agent Bazinet's evaluation in the Emergency Department is complete.  It has been determined that no acute conditions requiring further emergency intervention are present at this time. The patient/guardian have been advised of the diagnosis and plan. We have discussed signs and symptoms that warrant return to the ED, such as changes or worsening in symptoms.  Vital signs are stable at discharge. Filed Vitals:   11/19/13 2313  BP: 134/81  Pulse: 62  Temp: 98.1 F (36.7 C)  Resp: 16    Patient/guardian has voiced understanding and agreed to follow-up with the PCP or specialist.  I personally performed the services described in this documentation, which was scribed in my presence. The recorded information has been reviewed and is accurate.    Linus Mako, PA-C 11/20/13 0147

## 2013-11-19 NOTE — ED Notes (Signed)
Pt. fell from bed this evening , no LOC / ambulatory , reports pain at left upper back with small laceration at left earlobe with bruise at left side of face . Alert and oriented / respirations unlabored.

## 2013-11-19 NOTE — ED Notes (Signed)
Pt states that the only blood thinner he takes is an 81mg  ASA every morning.

## 2013-11-20 ENCOUNTER — Emergency Department (HOSPITAL_COMMUNITY): Payer: Medicare Other

## 2013-11-20 MED ORDER — HYDROCODONE-ACETAMINOPHEN 5-325 MG PO TABS: 1.0000 | ORAL_TABLET | ORAL | Status: DC | PRN

## 2013-11-20 NOTE — ED Notes (Signed)
Pt is in CT

## 2013-11-20 NOTE — ED Provider Notes (Signed)
Medical screening examination/treatment/procedure(s) were performed by non-physician practitioner and as supervising physician I was immediately available for consultation/collaboration.   EKG Interpretation None       Varney Biles, MD 11/20/13 [phone]

## 2013-11-20 NOTE — Discharge Instructions (Signed)
Facial or Scalp Contusion A facial or scalp contusion is a deep bruise on the face or head. Injuries to the face and head generally cause a lot of swelling, especially around the eyes. Contusions are the result of an injury that caused bleeding under the skin. The contusion may turn blue, purple, or yellow. Minor injuries will give you a painless contusion, but more severe contusions may stay painful and swollen for a few weeks.  CAUSES  A facial or scalp contusion is caused by a blunt injury or trauma to the face or head area.  SIGNS AND SYMPTOMS   Swelling of the injured area.   Discoloration of the injured area.   Tenderness, soreness, or pain in the injured area.  DIAGNOSIS  The diagnosis can be made by taking a medical history and doing a physical exam. An X-ray exam, CT scan, or MRI may be needed to determine if there are any associated injuries, such as broken bones (fractures). TREATMENT  Often, the best treatment for a facial or scalp contusion is applying cold compresses to the injured area. Over-the-counter medicines may also be recommended for pain control.  HOME CARE INSTRUCTIONS   Only take over-the-counter or prescription medicines as directed by your health care provider.   Apply ice to the injured area.   Put ice in a plastic bag.   Place a towel between your skin and the bag.   Leave the ice on for 20 minutes, 2 3 times a day.  SEEK MEDICAL CARE IF:  You have bite problems.   You have pain with chewing.   You are concerned about facial defects. SEEK IMMEDIATE MEDICAL CARE IF:  You have severe pain or a headache that is not relieved by medicine.   You have unusual sleepiness, confusion, or personality changes.   You throw up (vomit).   You have a persistent nosebleed.   You have double vision or blurred vision.   You have fluid drainage from your nose or ear.   You have difficulty walking or using your arms or legs.  MAKE SURE YOU:    Understand these instructions.  Will watch your condition.  Will get help right away if you are not doing well or get worse. Document Released: 08/24/2004 Document Revised: 05/07/2013 Document Reviewed: 02/27/2013 [facility] Patient Information 2014 [place], [place].   Facial Laceration  A facial laceration is a cut on the face. These injuries can be painful and cause bleeding. Lacerations usually heal quickly, but they need special care to reduce scarring. DIAGNOSIS  Your health care provider will take a medical history, ask for details about how the injury occurred, and examine the wound to determine how deep the cut is. TREATMENT  Some facial lacerations may not require closure. Others may not be able to be closed because of an increased risk of infection. The risk of infection and the chance for successful closure will depend on various factors, including the amount of time since the injury occurred. The wound may be cleaned to help prevent infection. If closure is appropriate, pain medicines may be given if needed. Your health care provider will use stitches (sutures), wound glue (adhesive), or skin adhesive strips to repair the laceration. These tools bring the skin edges together to allow for faster healing and a better cosmetic outcome. If needed, you may also be given a tetanus shot. HOME CARE INSTRUCTIONS  Only take over-the-counter or prescription medicines as directed by your health care provider.  Follow your health care  provider's instructions for wound care. These instructions will vary depending on the technique used for closing the wound. For Sutures:  Keep the wound clean and dry.   If you were given a bandage (dressing), you should change it at least once a day. Also change the dressing if it becomes wet or dirty, or as directed by your health care provider.   Wash the wound with soap and water 2 times a day. Rinse the wound off with water to remove all soap. Pat the  wound dry with a clean towel.   After cleaning, apply a thin layer of the antibiotic ointment recommended by your health care provider. This will help prevent infection and keep the dressing from sticking.   You may shower as usual after the first 24 hours. Do not soak the wound in water until the sutures are removed.   Get your sutures removed as directed by your health care provider. With facial lacerations, sutures should usually be taken out after 4 5 days to avoid stitch marks.   Wait a few days after your sutures are removed before applying any makeup. For Skin Adhesive Strips:  Keep the wound clean and dry.   Do not get the skin adhesive strips wet. You may bathe carefully, using caution to keep the wound dry.   If the wound gets wet, pat it dry with a clean towel.   Skin adhesive strips will fall off on their own. You may trim the strips as the wound heals. Do not remove skin adhesive strips that are still stuck to the wound. They will fall off in time.  For Wound Adhesive:  You may briefly wet your wound in the shower or bath. Do not soak or scrub the wound. Do not swim. Avoid periods of heavy sweating until the skin adhesive has fallen off on its own. After showering or bathing, gently pat the wound dry with a clean towel.   Do not apply liquid medicine, cream medicine, ointment medicine, or makeup to your wound while the skin adhesive is in place. This may loosen the film before your wound is healed.   If a dressing is placed over the wound, be careful not to apply tape directly over the skin adhesive. This may cause the adhesive to be pulled off before the wound is healed.   Avoid prolonged exposure to sunlight or tanning lamps while the skin adhesive is in place.  The skin adhesive will usually remain in place for 5 10 days, then naturally fall off the skin. Do not pick at the adhesive film.  After Healing: Once the wound has healed, cover the wound with  sunscreen during the day for 1 full year. This can help minimize scarring. Exposure to ultraviolet light in the first year will darken the scar. It can take 1 2 years for the scar to lose its redness and to heal completely.  SEEK IMMEDIATE MEDICAL CARE IF:  You have redness, pain, or swelling around the wound.   You see ayellowish-white fluid (pus) coming from the wound.   You have chills or a fever.  MAKE SURE YOU:  Understand these instructions.  Will watch your condition.  Will get help right away if you are not doing well or get worse. Document Released: 08/24/2004 Document Revised: 05/07/2013 Document Reviewed: 02/27/2013 [facility] Patient Information 2014 [place], [place].

## 2013-11-21 ENCOUNTER — Ambulatory Visit: Payer: Self-pay | Admitting: Physician Assistant

## 2013-11-28 ENCOUNTER — Ambulatory Visit (INDEPENDENT_AMBULATORY_CARE_PROVIDER_SITE_OTHER): Payer: Medicare Other | Admitting: Internal Medicine

## 2013-11-28 ENCOUNTER — Encounter: Payer: Self-pay | Admitting: Internal Medicine

## 2013-11-28 ENCOUNTER — Ambulatory Visit: Payer: Self-pay | Admitting: Physician Assistant

## 2013-11-28 VITALS — BP 118/80 | HR 76 | Temp 98.4°F | Resp 16 | Ht 75.5 in | Wt 247.4 lb

## 2013-11-28 DIAGNOSIS — I10 Essential (primary) hypertension: Secondary | ICD-10-CM

## 2013-11-28 DIAGNOSIS — R7309 Other abnormal glucose: Secondary | ICD-10-CM

## 2013-11-28 DIAGNOSIS — E559 Vitamin D deficiency, unspecified: Secondary | ICD-10-CM

## 2013-11-28 DIAGNOSIS — Z79899 Other long term (current) drug therapy: Secondary | ICD-10-CM | POA: Insufficient documentation

## 2013-11-28 DIAGNOSIS — E782 Mixed hyperlipidemia: Secondary | ICD-10-CM

## 2013-11-28 LAB — CBC WITH DIFFERENTIAL/PLATELET
BASOS ABS: 0 10*3/uL (ref 0.0–0.1)
Basophils Relative: 0 % (ref 0–1)
Eosinophils Absolute: 0.1 10*3/uL (ref 0.0–0.7)
Eosinophils Relative: 1 % (ref 0–5)
HCT: 42.5 % (ref 39.0–52.0)
Hemoglobin: 15.3 g/dL (ref 13.0–17.0)
LYMPHS PCT: 20 % (ref 12–46)
Lymphs Abs: 1.4 10*3/uL (ref 0.7–4.0)
MCH: 33.1 pg (ref 26.0–34.0)
MCHC: 36 g/dL (ref 30.0–36.0)
MCV: 92 fL (ref 78.0–100.0)
Monocytes Absolute: 0.8 10*3/uL (ref 0.1–1.0)
Monocytes Relative: 12 % (ref 3–12)
Neutro Abs: 4.6 10*3/uL (ref 1.7–7.7)
Neutrophils Relative %: 67 % (ref 43–77)
PLATELETS: 163 10*3/uL (ref 150–400)
RBC: 4.62 MIL/uL (ref 4.22–5.81)
RDW: 13.5 % (ref 11.5–15.5)
WBC: 6.8 10*3/uL (ref 4.0–10.5)

## 2013-11-28 LAB — HEMOGLOBIN A1C
Hgb A1c MFr Bld: 5.7 % — ABNORMAL HIGH (ref <5.7)
Mean Plasma Glucose: 117 mg/dL — ABNORMAL HIGH (ref <117)

## 2013-11-28 MED ORDER — PREDNISONE 20 MG PO TABS: 20.0000 mg | ORAL_TABLET | ORAL | Status: DC

## 2013-11-28 NOTE — Patient Instructions (Addendum)
Try Nasocort  (OTC) for allergies  And  Loritidine (Claritin) or Fexofenadine (Allegra)  Or Certirizine (Zyrtec)     Hypertension As your heart beats, it forces blood through your arteries. This force is your blood pressure. If the pressure is too high, it is called hypertension (HTN) or high blood pressure. HTN is dangerous because you may have it and not know it. High blood pressure may mean that your heart has to work harder to pump blood. Your arteries may be narrow or stiff. The extra work puts you at risk for heart disease, stroke, and other problems.  Blood pressure consists of two numbers, a higher number over a lower, 110/72, for example. It is stated as "110 over 72." The ideal is below 120 for the top number (systolic) and under 80 for the bottom (diastolic). Write down your blood pressure today. You should pay close attention to your blood pressure if you have certain conditions such as:  Heart failure.  Prior heart attack.  Diabetes  Chronic kidney disease.  Prior stroke.  Multiple risk factors for heart disease. To see if you have HTN, your blood pressure should be measured while you are seated with your arm held at the level of the heart. It should be measured at least twice. A one-time elevated blood pressure reading (especially in the Emergency Department) does not mean that you need treatment. There may be conditions in which the blood pressure is different between your right and left arms. It is important to see your caregiver soon for a recheck. Most people have essential hypertension which means that there is not a specific cause. This type of high blood pressure may be lowered by changing lifestyle factors such as:  Stress.  Smoking.  Lack of exercise.  Excessive weight.  Drug/tobacco/alcohol use.  Eating less salt. Most people do not have symptoms from high blood pressure until it has caused damage to the body. Effective treatment can often prevent,  delay or reduce that damage. TREATMENT  When a cause has been identified, treatment for high blood pressure is directed at the cause. There are a large number of medications to treat HTN. These fall into several categories, and your caregiver will help you select the medicines that are best for you. Medications may have side effects. You should review side effects with your caregiver. If your blood pressure stays high after you have made lifestyle changes or started on medicines,   Your medication(s) may need to be changed.  Other problems may need to be addressed.  Be certain you understand your prescriptions, and know how and when to take your medicine.  Be sure to follow up with your caregiver within the time frame advised (usually within two weeks) to have your blood pressure rechecked and to review your medications.  If you are taking more than one medicine to lower your blood pressure, make sure you know how and at what times they should be taken. Taking two medicines at the same time can result in blood pressure that is too low. SEEK IMMEDIATE MEDICAL CARE IF:  You develop a severe headache, blurred or changing vision, or confusion.  You have unusual weakness or numbness, or a faint feeling.  You have severe chest or abdominal pain, vomiting, or breathing problems. MAKE SURE YOU:   Understand these instructions.  Will watch your condition.  Will get help right away if you are not doing well or get worse.   Diabetes and Exercise Exercising regularly is important.  It is not just about losing weight. It has many health benefits, such as:  Improving your overall fitness, flexibility, and endurance.  Increasing your bone density.  Helping with weight control.  Decreasing your body fat.  Increasing your muscle strength.  Reducing stress and tension.  Improving your overall health. People with diabetes who exercise gain additional benefits because exercise:  Reduces  appetite.  Improves the body's use of blood sugar (glucose).  Helps lower or control blood glucose.  Decreases blood pressure.  Helps control blood lipids (such as cholesterol and triglycerides).  Improves the body's use of the hormone insulin by:  Increasing the body's insulin sensitivity.  Reducing the body's insulin needs.  Decreases the risk for heart disease because exercising:  Lowers cholesterol and triglycerides levels.  Increases the levels of good cholesterol (such as high-density lipoproteins [HDL]) in the body.  Lowers blood glucose levels. YOUR ACTIVITY PLAN  Choose an activity that you enjoy and set realistic goals. Your health care provider or diabetes educator can help you make an activity plan that works for you. You can break activities into 2 or 3 sessions throughout the day. Doing so is as good as one long session. Exercise ideas include:  Taking the dog for a walk.  Taking the stairs instead of the elevator.  Dancing to your favorite song.  Doing your favorite exercise with a friend. RECOMMENDATIONS FOR EXERCISING WITH TYPE 1 OR TYPE 2 DIABETES   Check your blood glucose before exercising. If blood glucose levels are greater than 240 mg/dL, check for urine ketones. Do not exercise if ketones are present.  Avoid injecting insulin into areas of the body that are going to be exercised. For example, avoid injecting insulin into:  The arms when playing tennis.  The legs when jogging.  Keep a record of:  Food intake before and after you exercise.  Expected peak times of insulin action.  Blood glucose levels before and after you exercise.  The type and amount of exercise you have done.  Review your records with your health care provider. Your health care provider will help you to develop guidelines for adjusting food intake and insulin amounts before and after exercising.  If you take insulin or oral hypoglycemic agents, watch for signs and  symptoms of hypoglycemia. They include:  Dizziness.  Shaking.  Sweating.  Chills.  Confusion.  Drink plenty of water while you exercise to prevent dehydration or heat stroke. Body water is lost during exercise and must be replaced.  Talk to your health care provider before starting an exercise program to make sure it is safe for you. Remember, almost any type of activity is better than none.    Cholesterol Cholesterol is a white, waxy, fat-like protein needed by your body in small amounts. The liver makes all the cholesterol you need. It is carried from the liver by the blood through the blood vessels. Deposits (plaque) may build up on blood vessel walls. This makes the arteries narrower and stiffer. Plaque increases the risk for heart attack and stroke. You cannot feel your cholesterol level even if it is very high. The only way to know is by a blood test to check your lipid (fats) levels. Once you know your cholesterol levels, you should keep a record of the test results. Work with your caregiver to to keep your levels in the desired range. WHAT THE RESULTS MEAN:  Total cholesterol is a rough measure of all the cholesterol in your blood.  LDL  is the so-called bad cholesterol. This is the type that deposits cholesterol in the walls of the arteries. You want this level to be low.  HDL is the good cholesterol because it cleans the arteries and carries the LDL away. You want this level to be high.  Triglycerides are fat that the body can either burn for energy or store. High levels are closely linked to heart disease. DESIRED LEVELS:  Total cholesterol below 200.  LDL below 100 for people at risk, below 70 for very high risk.  HDL above 50 is good, above 60 is best.  Triglycerides below 150. HOW TO LOWER YOUR CHOLESTEROL:  Diet.  Choose fish or white meat chicken and [place], roasted or baked. Limit fatty cuts of red meat, fried foods, and processed meats, such as sausage and  lunch meat.  Eat lots of fresh fruits and vegetables. Choose whole grains, beans, pasta, potatoes and cereals.  Use only small amounts of olive, corn or canola oils. Avoid butter, mayonnaise, shortening or palm kernel oils. Avoid foods with trans-fats.  Use skim/nonfat milk and low-fat/nonfat yogurt and cheeses. Avoid whole milk, cream, ice cream, egg yolks and cheeses. Healthy desserts include angel food cake, ginger snaps, animal crackers, hard candy, popsicles, and low-fat/nonfat frozen yogurt. Avoid pastries, cakes, pies and cookies.  Exercise.  A regular program helps decrease LDL and raises HDL.  Helps with weight control.  Do things that increase your activity level like gardening, walking, or taking the stairs.  Medication.  May be prescribed by your caregiver to help lowering cholesterol and the risk for heart disease.  You may need medicine even if your levels are normal if you have several risk factors. HOME CARE INSTRUCTIONS   Follow your diet and exercise programs as suggested by your caregiver.  Take medications as directed.  Have blood work done when your caregiver feels it is necessary. MAKE SURE YOU:   Understand these instructions.  Will watch your condition.  Will get help right away if you are not doing well or get worse.      Vitamin D Deficiency Vitamin D is an important vitamin that your body needs. Having too little of it in your body is called a deficiency. A very bad deficiency can make your bones soft and can cause a condition called rickets.  Vitamin D is important to your body for different reasons, such as:   It helps your body absorb 2 minerals called calcium and phosphorus.  It helps make your bones healthy.  It may prevent some diseases, such as diabetes and multiple sclerosis.  It helps your muscles and heart. You can get vitamin D in several ways. It is a natural part of some foods. The vitamin is also added to some dairy products  and cereals. Some people take vitamin D supplements. Also, your body makes vitamin D when you are in the sun. It changes the sun's rays into a form of the vitamin that your body can use. CAUSES   Not eating enough foods that contain vitamin D.  Not getting enough sunlight.  Having certain digestive system diseases that make it hard to absorb vitamin D. These diseases include Crohn's disease, chronic pancreatitis, and cystic fibrosis.  Having a surgery in which part of the stomach or small intestine is removed.  Being obese. Fat cells pull vitamin D out of your blood. That means that obese people may not have enough vitamin D left in their blood and in other body tissues.  Having chronic kidney or liver disease. RISK FACTORS Risk factors are things that make you more likely to develop a vitamin D deficiency. They include:  Being older.  Not being able to get outside very much.  Living in a nursing home.  Having had broken bones.  Having weak or thin bones (osteoporosis).  Having a disease or condition that changes how your body absorbs vitamin D.  Having dark skin.  Some medicines such as seizure medicines or steroids.  Being overweight or obese. SYMPTOMS Mild cases of vitamin D deficiency may not have any symptoms. If you have a very bad case, symptoms may include:  Bone pain.  Muscle pain.  Falling often.  Broken bones caused by a minor injury, due to osteoporosis. DIAGNOSIS A blood test is the best way to tell if you have a vitamin D deficiency. TREATMENT Vitamin D deficiency can be treated in different ways. Treatment for vitamin D deficiency depends on what is causing it. Options include:  Taking vitamin D supplements.  Taking a calcium supplement. Your caregiver will suggest what dose is best for you. HOME CARE INSTRUCTIONS  Take any supplements that your caregiver prescribes. Follow the directions carefully. Take only the suggested amount.  Have your  blood tested 2 months after you start taking supplements.  Eat foods that contain vitamin D. Healthy choices include:  Fortified dairy products, cereals, or juices. Fortified means vitamin D has been added to the food. Check the label on the package to be sure.  Fatty fish like salmon or trout.  Eggs.  Oysters.  Do not use a tanning bed.  Keep your weight at a healthy level. Lose weight if you need to.  Keep all follow-up appointments. Your caregiver will need to perform blood tests to make sure your vitamin D deficiency is going away. SEEK MEDICAL CARE IF:  You have any questions about your treatment.  You continue to have symptoms of vitamin D deficiency.  You have nausea or vomiting.  You are constipated.  You feel confused.  You have severe abdominal or back pain. MAKE SURE YOU:  Understand these instructions.  Will watch your condition.  Will get help right away if you are not doing well or get worse.

## 2013-11-28 NOTE — Progress Notes (Signed)
Patient ID: Cody Hamilton, male   DOB: 06-Jan-1941, 73 y.o.   MRN: [id number]    This very nice 73 y.o. male presents for 3 month follow up with Hypertension, Hyperlipidemia, Pre-Diabetes and Vitamin D Deficiency.    HTN predates since 75. BP has been controlled at home. Today's BP: 118/80 mmHg . Patient denies any cardiac type chest pain, palpitations, dyspnea/orthopnea/PND, dizziness, claudication, or dependent edema.   Hyperlipidemia is controlled with diet & meds. Last Lipid Profile shows cholesterol at goal as below. Lab Results  Component Value Date   CHOL 164 08/25/2013   HDL 46 08/25/2013   LDLCALC 95 08/25/2013   TRIG 114 08/25/2013   CHOLHDL 3.6 08/25/2013    Also, the patient has history of PreDiabetes/insulin resistance with A1c 5.4% and elevated fasting insulin of 33 in [id number]  and last A1c was  5.7% in Jan 2015. Patient denies any symptoms of reactive hypoglycemia, diabetic polys, paresthesias or visual blurring.   Further, Patient has history of Vitamin D Deficiency of 78 in 2008 with last vitamin D was 96 in Jan 2015. Patient supplements vitamin D without any suspected side-effects.  Medication Sig  . ALPRAZolam (XANAX) 1 MG  1/2 to 1 tablet 3 x daily as needed for anxiety or sleep  . aspirin 81 MG EC Take 81 mg by mouth daily.    . Cholecalciferol (VITAMIN D3) 1,0000 UNITS  Take 10,000 Units by mouth daily.    . enalapril (VASOTEC) 20 MG tablet Take 20 mg by mouth daily.    . hydrochlorothiazide  12.5 MG  Take 12.5 mg by mouth daily.    . magnesium oxide (MAG-OX) 400 MG tablet Take 400 mg by mouth 2 (two) times daily.   . Multiple Vitamin (MULTIVITAMIN) tablet Take 1 tablet by mouth daily.    . Omega-3 Fatty Acids (FISH OIL) 1200 MG  Take 1 capsule by mouth 2 (two) times daily.   . pravastatin (PRAVACHOL) 20 MG tablet Take 20 mg by mouth daily.    . vitamin C (ASCORBIC ACID) 500 MG tablet Take 500 mg by mouth daily.    . vitamin E 400 UNIT capsule Take 400 Units by  mouth daily.      Allergies  Allergen Reactions  . Viagra [Sildenafil Citrate]     Headache   PMHx:   Past Medical History  Diagnosis Date  . Colon polyps   . Inguinal hernia     RIGHT  . Hypertension   . Hyperlipidemia   . Elevated hemoglobin A1c   . Vitamin D deficiency   . BPH (benign prostatic hypertrophy)   . Glaucoma   . DDD (degenerative disc disease)    FHx:    Reviewed / unchanged  SHx:    Reviewed / unchanged   Systems Review: Constitutional: Denies fever, chills, wt changes, headaches, insomnia, fatigue, night sweats, change in appetite. Eyes: Denies redness, blurred vision, diplopia, discharge, itchy, watery eyes.  ENT: Denies discharge, congestion, post nasal drip, epistaxis, sore throat, earache, hearing loss, dental pain, tinnitus, vertigo, sinus pain, snoring.  CV: Denies chest pain, palpitations, irregular heartbeat, syncope, dyspnea, diaphoresis, orthopnea, PND, claudication, edema. Respiratory: denies cough, dyspnea, DOE, pleurisy, hoarseness, laryngitis, wheezing.  Gastrointestinal: Denies dysphagia, odynophagia, heartburn, reflux, water brash, abdominal pain or cramps, nausea, vomiting, bloating, diarrhea, constipation, hematemesis, melena, hematochezia,  or hemorrhoids. Genitourinary: Denies dysuria, frequency, urgency, nocturia, hesitancy, discharge, hematuria, flank pain. Musculoskeletal: Denies arthralgias, myalgias, stiffness, jt. swelling, pain, limp, strain/sprain.  Skin: Denies pruritus, rash, hives,  warts, acne, eczema, change in skin lesion(s). Neuro: No weakness, tremor, incoordination, spasms, paresthesia, or pain. Psychiatric: Denies confusion, memory loss, or sensory loss. Endo: Denies change in weight, skin, hair change.  Heme/Lymph: No excessive bleeding, bruising, orenlarged lymph nodes.   Exam:  BP 118/80  Pulse 76  Temp 98.4 F  Resp 16  Ht 6' 3.5"   Wt 247 lb 6.4 oz   BMI 30.51 kg/m2  Appears well nourished - in no  distress. Eyes: PERRLA, EOMs, conjunctiva no swelling or erythema. Sinuses: No frontal/maxillary tenderness ENT/Mouth: EAC's clear, TM's nl w/o erythema, bulging. Nares clear w/o erythema, swelling, exudates. Oropharynx clear without erythema or exudates. Oral hygiene is good. Tongue normal, non obstructing. Hearing intact.  Neck: Supple. Thyroid nl. Car 2+/2+ without bruits, nodes or JVD. Chest: Respirations nl with BS clear & equal w/o rales, rhonchi, wheezing or stridor.  Cor: Heart sounds normal w/ regular rate and rhythm without sig. murmurs, gallops, clicks, or rubs. Peripheral pulses normal and equal  without edema.  Abdomen: Soft & bowel sounds normal. Non-tender w/o guarding, rebound, hernias, masses, or organomegaly.  Lymphatics: Unremarkable.  Musculoskeletal: Full ROM all peripheral extremities, joint stability, 5/5 strength, and normal gait.  Skin: Warm, dry without exposed rashes, lesions, ecchymosis apparent.  Neuro: Cranial nerves intact, reflexes equal bilaterally. Sensory-motor testing grossly intact. Tendon reflexes grossly intact.  Pysch: Alert & oriented x 3. Insight and judgement nl & appropriate. No ideations.  Assessment and Plan:  1. Hypertension - Continue monitor blood pressure at home. Continue diet/meds same.  2. Hyperlipidemia - Continue diet/meds, exercise,& lifestyle modifications. Continue monitor periodic cholesterol/liver & renal functions   3. Pre-diabetes/Insulin Resistance - Continue diet, exercise, lifestyle modifications. Monitor appropriate labs.  4. Vitamin D Deficiency - Continue supplementation.  Recommended regular exercise, BP monitoring, weight control, and discussed med and SE's. Recommended labs to assess and monitor clinical status. Further disposition pending results of labs.

## 2013-11-29 LAB — BASIC METABOLIC PANEL WITH GFR
BUN: 15 mg/dL (ref 6–23)
CHLORIDE: 102 meq/L (ref 96–112)
CO2: 29 mEq/L (ref 19–32)
CREATININE: 1.01 mg/dL (ref 0.50–1.35)
Calcium: 9.4 mg/dL (ref 8.4–10.5)
GFR, EST NON AFRICAN AMERICAN: 74 mL/min
GFR, Est African American: 86 mL/min
Glucose, Bld: 100 mg/dL — ABNORMAL HIGH (ref 70–99)
POTASSIUM: 4.2 meq/L (ref 3.5–5.3)
Sodium: 138 mEq/L (ref 135–145)

## 2013-11-29 LAB — LIPID PANEL
Cholesterol: 166 mg/dL (ref 0–200)
HDL: 52 mg/dL (ref >39)
LDL Cholesterol: 86 mg/dL (ref 0–99)
TRIGLYCERIDES: 141 mg/dL (ref <150)
Total CHOL/HDL Ratio: 3.2 Ratio
VLDL: 28 mg/dL (ref 0–40)

## 2013-11-29 LAB — HEPATIC FUNCTION PANEL
ALBUMIN: 4.3 g/dL (ref 3.5–5.2)
ALT: 19 U/L (ref 0–53)
AST: 20 U/L (ref 0–37)
Alkaline Phosphatase: 59 U/L (ref 39–117)
BILIRUBIN INDIRECT: 0.9 mg/dL (ref 0.2–1.2)
BILIRUBIN TOTAL: 1.1 mg/dL (ref 0.2–1.2)
Bilirubin, Direct: 0.2 mg/dL (ref 0.0–0.3)
Total Protein: 6.7 g/dL (ref 6.0–8.3)

## 2013-11-29 LAB — INSULIN, FASTING: INSULIN FASTING, SERUM: 21 u[IU]/mL (ref 3–28)

## 2013-11-29 LAB — TSH: TSH: 1.998 u[IU]/mL (ref 0.350–4.500)

## 2013-11-29 LAB — MAGNESIUM: Magnesium: 2 mg/dL (ref 1.5–2.5)

## 2013-11-29 LAB — VITAMIN D 25 HYDROXY (VIT D DEFICIENCY, FRACTURES): Vit D, 25-Hydroxy: 104 ng/mL — ABNORMAL HIGH (ref 30–89)

## 2013-12-04 ENCOUNTER — Ambulatory Visit: Payer: Self-pay | Admitting: Physician Assistant

## 2014-01-12 ENCOUNTER — Encounter: Payer: Self-pay | Admitting: Emergency Medicine

## 2014-01-12 ENCOUNTER — Ambulatory Visit (INDEPENDENT_AMBULATORY_CARE_PROVIDER_SITE_OTHER): Payer: Medicare Other | Admitting: Emergency Medicine

## 2014-01-12 VITALS — BP 128/80 | HR 76 | Temp 98.0°F | Resp 18 | Ht 75.75 in | Wt 250.0 lb

## 2014-01-12 DIAGNOSIS — J020 Streptococcal pharyngitis: Secondary | ICD-10-CM

## 2014-01-12 MED ORDER — AZITHROMYCIN 250 MG PO TABS: ORAL_TABLET | ORAL | Status: AC

## 2014-01-12 MED ORDER — BENZONATATE 100 MG PO CAPS: 100.0000 mg | ORAL_CAPSULE | Freq: Three times a day (TID) | ORAL | Status: DC | PRN

## 2014-01-12 NOTE — Patient Instructions (Signed)
Sore Throat A sore throat is a painful, burning, sore, or scratchy feeling of the throat. There may be pain or tenderness when swallowing or talking. You may have other symptoms with a sore throat. These include coughing, sneezing, fever, or a swollen neck. A sore throat is often the first sign of another sickness. These sicknesses may include a cold, flu, strep throat, or an infection called mono. Most sore throats go away without medical treatment.  HOME CARE   Only take medicine as told by your doctor.  Drink enough fluids to keep your pee (urine) clear or pale yellow.  Rest as needed.  Try using throat sprays, lozenges, or suck on hard candy (if older than 4 years or as told).  Sip warm liquids, such as broth, herbal tea, or warm water with honey. Try sucking on frozen ice pops or drinking cold liquids.  Rinse the mouth (gargle) with salt water. Mix 1 teaspoon salt with 8 ounces of water.  Do not smoke. Avoid being around others when they are smoking.  Put a humidifier in your bedroom at night to moisten the air. You can also turn on a hot shower and sit in the bathroom for 5 10 minutes. Be sure the bathroom door is closed. GET HELP RIGHT AWAY IF:   You have trouble breathing.  You cannot swallow fluids, soft foods, or your spit (saliva).  You have more puffiness (swelling) in the throat.  Your sore throat does not get better in 7 days.  You feel sick to your stomach (nauseous) and throw up (vomit).  You have a fever or lasting symptoms for more than 2 3 days.  You have a fever and your symptoms suddenly get worse. MAKE SURE YOU:   Understand these instructions.  Will watch your condition.  Will get help right away if you are not doing well or get worse. Document Released: 04/25/2008 Document Revised: 04/10/2012 Document Reviewed: 03/24/2012 [facility] Patient Information 2014 [place], [place].

## 2014-01-12 NOTE — Progress Notes (Signed)
Subjective:    Patient ID: Cody Hamilton, male    DOB: 1941-03-27, 73 y.o.   MRN: [id number]  HPI Comments: 74 yo WM has recently been on prednisone until 2 weeks ago.  He has increased sore throat since last week. He has tried multiple OTC without relief. He is now hoarse.  He has been more tired over last week or so.   Sore Throat       Medication List       This list is accurate as of: 01/12/14 11:59 PM.  Always use your most recent med list.               ALPRAZolam 1 MG tablet  Commonly known as:  XANAX  1/2 to 1 tablet 3 x daily as needed for anxiety or sleep     aspirin 81 MG EC tablet  Take 81 mg by mouth daily.     azithromycin 250 MG tablet  Commonly known as:  ZITHROMAX  Take 2 tablets (500 mg) on  Day 1,  followed by 1 tablet (250 mg) once daily on Days 2 through 5.     benzonatate 100 MG capsule  Commonly known as:  TESSALON PERLES  Take 1 capsule (100 mg total) by mouth 3 (three) times daily as needed.     enalapril 20 MG tablet  Commonly known as:  VASOTEC  Take 20 mg by mouth daily.     Fish Oil 1200 MG Caps  Take 1 capsule by mouth 2 (two) times daily.     hydrochlorothiazide 12.5 MG tablet  Commonly known as:  HYDRODIURIL  Take 12.5 mg by mouth daily.     magnesium oxide 400 MG tablet  Commonly known as:  MAG-OX  Take 400 mg by mouth 2 (two) times daily.     multivitamin tablet  Take 1 tablet by mouth daily.     pravastatin 20 MG tablet  Commonly known as:  PRAVACHOL  Take 20 mg by mouth daily.     vitamin C 500 MG tablet  Commonly known as:  ASCORBIC ACID  Take 500 mg by mouth daily.     Vitamin D3 10000 UNITS capsule  Take 10,000 Units by mouth every other day.     vitamin E 400 UNIT capsule  Take 400 Units by mouth daily.       Allergies  Allergen Reactions  . Viagra [Sildenafil Citrate]     Headache     Review of Systems BP 128/80  Pulse 76  Temp(Src) 98 F (36.7 C) (Temporal)  Resp 18  Ht 6' 3.75" (1.924 m)   Wt 250 lb (113.399 kg)  BMI 30.63 kg/m2     Objective:   Physical Exam  Nursing note and vitals reviewed. Constitutional: He is oriented to person, place, and time. He appears well-developed and well-nourished.  HENT:  Head: Normocephalic and atraumatic.  Right Ear: External ear normal.  Left Ear: External ear normal.  Nose: Nose normal.  Mouth/Throat: No oropharyngeal exudate.  Erythematous post pharynx  Eyes: Conjunctivae are normal.  Neck: Normal range of motion.  Cardiovascular: Normal rate, regular rhythm, normal heart sounds and intact distal pulses.   Pulmonary/Chest: Effort normal and breath sounds normal.  Abdominal: Soft.  Musculoskeletal: Normal range of motion.  Lymphadenopathy:    He has cervical adenopathy.  Neurological: He is alert and oriented to person, place, and time.  Skin: Skin is warm and dry.  Psychiatric: He has a normal mood and  affect. Judgment normal.          Assessment & Plan:  Sore throat- ? SZpak AD, Warm salt water gargles daily. 1 tsp liquid benadryl + 1 tsp liquid Maalox, MIX/ GARGLE/ SPIT as needed for pain, increase H2o, hygiene explained

## 2014-03-02 ENCOUNTER — Encounter: Payer: Self-pay | Admitting: Internal Medicine

## 2014-03-02 ENCOUNTER — Ambulatory Visit (INDEPENDENT_AMBULATORY_CARE_PROVIDER_SITE_OTHER): Payer: Medicare Other | Admitting: Internal Medicine

## 2014-03-02 VITALS — BP 122/76 | HR 76 | Temp 97.5°F | Resp 16 | Ht 75.75 in | Wt 251.8 lb

## 2014-03-02 DIAGNOSIS — Z79899 Other long term (current) drug therapy: Secondary | ICD-10-CM

## 2014-03-02 DIAGNOSIS — E559 Vitamin D deficiency, unspecified: Secondary | ICD-10-CM

## 2014-03-02 DIAGNOSIS — E782 Mixed hyperlipidemia: Secondary | ICD-10-CM

## 2014-03-02 DIAGNOSIS — R7309 Other abnormal glucose: Secondary | ICD-10-CM

## 2014-03-02 DIAGNOSIS — I10 Essential (primary) hypertension: Secondary | ICD-10-CM

## 2014-03-02 LAB — CBC WITH DIFFERENTIAL/PLATELET
Basophils Absolute: 0 10*3/uL (ref 0.0–0.1)
Basophils Relative: 0 % (ref 0–1)
Eosinophils Absolute: 0.1 10*3/uL (ref 0.0–0.7)
Eosinophils Relative: 2 % (ref 0–5)
HCT: 44.7 % (ref 39.0–52.0)
Hemoglobin: 15.9 g/dL (ref 13.0–17.0)
LYMPHS ABS: 1.8 10*3/uL (ref 0.7–4.0)
LYMPHS PCT: 32 % (ref 12–46)
MCH: 33.3 pg (ref 26.0–34.0)
MCHC: 35.6 g/dL (ref 30.0–36.0)
MCV: 93.7 fL (ref 78.0–100.0)
MONOS PCT: 9 % (ref 3–12)
Monocytes Absolute: 0.5 10*3/uL (ref 0.1–1.0)
NEUTROS PCT: 57 % (ref 43–77)
Neutro Abs: 3.2 10*3/uL (ref 1.7–7.7)
Platelets: 155 10*3/uL (ref 150–400)
RBC: 4.77 MIL/uL (ref 4.22–5.81)
RDW: 13.8 % (ref 11.5–15.5)
WBC: 5.7 10*3/uL (ref 4.0–10.5)

## 2014-03-02 NOTE — Progress Notes (Signed)
Patient ID: Cody Hamilton, male   DOB: [birth year], 73 y.o.   MRN: [id number]   This very nice 73 y.o.MWM presents for 3 month follow up with Hypertension, Hyperlipidemia, Pre-Diabetes and Vitamin D Deficiency.    Patient is treated for HTN (1990) & BP has been controlled at home. Today's BP: 122/76 mmHg. Patient denies any cardiac type chest pain, palpitations, dyspnea/orthopnea/PND, dizziness, claudication, or dependent edema.   Hyperlipidemia is controlled with diet & meds. Patient denies myalgias or other med SE's. Last Lipids were 11/28/2013: Cholesterol, Total 166; HDL Cholesterol by NMR 52; LDL (calc) 86; Triglycerides 141   Also, the patient has history of PreDiabetes and patient denies any symptoms of reactive hypoglycemia, diabetic polys, paresthesias or visual blurring.  Last A1c was 11/28/2013: Hemoglobin-A1c 5.7*     Further, Patient has history of Vitamin D Deficiency of 36 in 2008 and patient supplements vitamin D without any suspected side-effects. Last vitamin D was 11/28/2013: Vit D, 25-Hydroxy 104*  Medication List   aspirin 81 MG EC tablet  Take 81 mg by mouth daily.     enalapril 20 MG tablet  Commonly known as:  VASOTEC  Take 20 mg by mouth daily.     Fish Oil 1200 MG Caps  Take 1 capsule by mouth 2 (two) times daily.     hydrochlorothiazide 12.5 MG tablet  Commonly known as:  HYDRODIURIL  Take 12.5 mg by mouth daily.     magnesium oxide 400 MG tablet  Commonly known as:  MAG-OX  Take 400 mg by mouth 2 (two) times daily.     multivitamin tablet  Take 1 tablet by mouth daily.     pravastatin 20 MG tablet  Commonly known as:  PRAVACHOL  Take 20 mg by mouth daily.     vitamin C 500 MG tablet  Commonly known as:  ASCORBIC ACID  Take 500 mg by mouth daily.     Vitamin D3 10000 UNITS capsule  Take 10,000 Units by mouth every other day.     vitamin E 400 UNIT capsule  Take 400 Units by mouth daily.       Allergies  Allergen Reactions  . Viagra [Sildenafil  Citrate]     Headache   PMHx:   Past Medical History  Diagnosis Date  . Colon polyps   . Inguinal hernia     RIGHT  . Hypertension   . Hyperlipidemia   . Elevated hemoglobin A1c   . Vitamin D deficiency   . BPH (benign prostatic hypertrophy)   . Glaucoma   . DDD (degenerative disc disease)    FHx:    Reviewed / unchanged SHx:    Reviewed / unchanged  Systems Review:  Constitutional: Denies fever, chills, wt changes, headaches, insomnia, fatigue, night sweats, change in appetite. Eyes: Denies redness, blurred vision, diplopia, discharge, itchy, watery eyes.  ENT: Denies discharge, congestion, post nasal drip, epistaxis, sore throat, earache, hearing loss, dental pain, tinnitus, vertigo, sinus pain, snoring.  CV: Denies chest pain, palpitations, irregular heartbeat, syncope, dyspnea, diaphoresis, orthopnea, PND, claudication or edema. Respiratory: denies cough, dyspnea, DOE, pleurisy, hoarseness, laryngitis, wheezing.  Gastrointestinal: Denies dysphagia, odynophagia, heartburn, reflux, water brash, abdominal pain or cramps, nausea, vomiting, bloating, diarrhea, constipation, hematemesis, melena, hematochezia  or hemorrhoids. Genitourinary: Denies dysuria, frequency, urgency, nocturia, hesitancy, discharge, hematuria or flank pain. Musculoskeletal: Denies arthralgias, myalgias, stiffness, jt. swelling, pain, limping or strain/sprain.  Skin: Denies pruritus, rash, hives, warts, acne, eczema or change in skin lesion(s). Neuro: No weakness,  tremor, incoordination, spasms, paresthesia or pain. Psychiatric: Denies confusion, memory loss or sensory loss. Endo: Denies change in weight, skin or hair change.  Heme/Lymph: No excessive bleeding, bruising or enlarged lymph nodes.  Exam:  BP 122/76  Pulse 76  Temp(Src) 97.5 F (36.4 C) (Temporal)  Resp 16  Ht 6' 3.75" (1.924 m)  Wt 251 lb 12.8 oz (114.216 kg)  BMI 30.85 kg/m2  Appears well nourished and in no distress. Eyes: PERRLA,  EOMs, conjunctiva no swelling or erythema. Sinuses: No frontal/maxillary tenderness ENT/Mouth: EAC's clear, TM's nl w/o erythema, bulging. Nares clear w/o erythema, swelling, exudates. Oropharynx clear without erythema or exudates. Oral hygiene is good. Tongue normal, non obstructing. Hearing intact.  Neck: Supple. Thyroid nl. Car 2+/2+ without bruits, nodes or JVD. Chest: Respirations nl with BS clear & equal w/o rales, rhonchi, wheezing or stridor.  Cor: Heart sounds normal w/ regular rate and rhythm without sig. murmurs, gallops, clicks, or rubs. Peripheral pulses normal and equal  without edema.  Abdomen: Soft & bowel sounds normal. Non-tender w/o guarding, rebound, hernias, masses, or organomegaly.  Lymphatics: Unremarkable.  Musculoskeletal: Full ROM all peripheral extremities, joint stability, 5/5 strength, and normal gait.  Skin: Warm, dry without exposed rashes, lesions or ecchymosis apparent.  Neuro: Cranial nerves intact, reflexes equal bilaterally. Sensory-motor testing grossly intact. Tendon reflexes grossly intact.  Pysch: Alert & oriented x 3. Insight and judgement nl & appropriate. No ideations.  Assessment and Plan:  1. Hypertension - Continue monitor blood pressure at home. Continue diet/meds same.  2. Hyperlipidemia - Continue diet/meds, exercise,& lifestyle modifications. Continue monitor periodic cholesterol/liver & renal functions   3. Pre-Diabetes - Continue diet, exercise, lifestyle modifications. Monitor appropriate labs.   4. Vitamin D Deficiency - Continue supplementation.  Recommended regular exercise, BP monitoring, weight control, and discussed med and SE's. Recommended labs to assess and monitor clinical status. Further disposition pending results of labs.

## 2014-03-02 NOTE — Patient Instructions (Signed)

## 2014-03-03 LAB — LIPID PANEL
CHOL/HDL RATIO: 3.2 ratio
Cholesterol: 166 mg/dL (ref 0–200)
HDL: 52 mg/dL (ref >39)
LDL Cholesterol: 91 mg/dL (ref 0–99)
Triglycerides: 115 mg/dL (ref <150)
VLDL: 23 mg/dL (ref 0–40)

## 2014-03-03 LAB — HEPATIC FUNCTION PANEL
ALT: 20 U/L (ref 0–53)
AST: 21 U/L (ref 0–37)
Albumin: 4.5 g/dL (ref 3.5–5.2)
Alkaline Phosphatase: 45 U/L (ref 39–117)
BILIRUBIN DIRECT: 0.2 mg/dL (ref 0.0–0.3)
BILIRUBIN TOTAL: 1.4 mg/dL — AB (ref 0.2–1.2)
Indirect Bilirubin: 1.2 mg/dL (ref 0.2–1.2)
Total Protein: 6.5 g/dL (ref 6.0–8.3)

## 2014-03-03 LAB — BASIC METABOLIC PANEL WITH GFR
BUN: 16 mg/dL (ref 6–23)
CHLORIDE: 101 meq/L (ref 96–112)
CO2: 28 mEq/L (ref 19–32)
Calcium: 9.1 mg/dL (ref 8.4–10.5)
Creat: 0.98 mg/dL (ref 0.50–1.35)
GFR, Est African American: 89 mL/min
GFR, Est Non African American: 77 mL/min
GLUCOSE: 92 mg/dL (ref 70–99)
POTASSIUM: 4.4 meq/L (ref 3.5–5.3)
SODIUM: 140 meq/L (ref 135–145)

## 2014-03-03 LAB — TSH: TSH: 2.295 u[IU]/mL (ref 0.350–4.500)

## 2014-03-03 LAB — INSULIN, FASTING: Insulin fasting, serum: 25 u[IU]/mL (ref 3–28)

## 2014-03-03 LAB — VITAMIN D 25 HYDROXY (VIT D DEFICIENCY, FRACTURES): VIT D 25 HYDROXY: 75 ng/mL (ref 30–89)

## 2014-03-03 LAB — HEMOGLOBIN A1C
Hgb A1c MFr Bld: 5.6 % (ref <5.7)
Mean Plasma Glucose: 114 mg/dL (ref <117)

## 2014-03-03 LAB — MAGNESIUM: Magnesium: 1.9 mg/dL (ref 1.5–2.5)

## 2014-03-18 ENCOUNTER — Other Ambulatory Visit: Payer: Self-pay | Admitting: Internal Medicine

## 2014-05-15 ENCOUNTER — Other Ambulatory Visit: Payer: Self-pay

## 2014-05-15 ENCOUNTER — Other Ambulatory Visit: Payer: Self-pay | Admitting: Internal Medicine

## 2014-06-08 ENCOUNTER — Encounter: Payer: Self-pay | Admitting: Physician Assistant

## 2014-06-08 ENCOUNTER — Ambulatory Visit (INDEPENDENT_AMBULATORY_CARE_PROVIDER_SITE_OTHER): Payer: Medicare Other | Admitting: Physician Assistant

## 2014-06-08 VITALS — BP 120/78 | HR 84 | Temp 97.7°F | Resp 16 | Ht 75.75 in | Wt 251.0 lb

## 2014-06-08 DIAGNOSIS — Z9181 History of falling: Secondary | ICD-10-CM

## 2014-06-08 DIAGNOSIS — R296 Repeated falls: Secondary | ICD-10-CM

## 2014-06-08 DIAGNOSIS — I10 Essential (primary) hypertension: Secondary | ICD-10-CM

## 2014-06-08 DIAGNOSIS — E663 Overweight: Secondary | ICD-10-CM

## 2014-06-08 DIAGNOSIS — H409 Unspecified glaucoma: Secondary | ICD-10-CM

## 2014-06-08 DIAGNOSIS — R7309 Other abnormal glucose: Secondary | ICD-10-CM

## 2014-06-08 DIAGNOSIS — Z1331 Encounter for screening for depression: Secondary | ICD-10-CM

## 2014-06-08 DIAGNOSIS — Z0000 Encounter for general adult medical examination without abnormal findings: Secondary | ICD-10-CM

## 2014-06-08 DIAGNOSIS — E559 Vitamin D deficiency, unspecified: Secondary | ICD-10-CM

## 2014-06-08 DIAGNOSIS — Z23 Encounter for immunization: Secondary | ICD-10-CM

## 2014-06-08 DIAGNOSIS — E782 Mixed hyperlipidemia: Secondary | ICD-10-CM

## 2014-06-08 DIAGNOSIS — M5136 Other intervertebral disc degeneration, lumbar region: Secondary | ICD-10-CM

## 2014-06-08 DIAGNOSIS — Z79899 Other long term (current) drug therapy: Secondary | ICD-10-CM

## 2014-06-08 DIAGNOSIS — N400 Enlarged prostate without lower urinary tract symptoms: Secondary | ICD-10-CM

## 2014-06-08 DIAGNOSIS — E669 Obesity, unspecified: Secondary | ICD-10-CM

## 2014-06-08 HISTORY — DX: Overweight: E66.3

## 2014-06-08 LAB — CBC WITH DIFFERENTIAL/PLATELET
BASOS ABS: 0 10*3/uL (ref 0.0–0.1)
Basophils Relative: 0 % (ref 0–1)
EOS PCT: 2 % (ref 0–5)
Eosinophils Absolute: 0.1 10*3/uL (ref 0.0–0.7)
HEMATOCRIT: 44.8 % (ref 39.0–52.0)
HEMOGLOBIN: 16 g/dL (ref 13.0–17.0)
LYMPHS PCT: 36 % (ref 12–46)
Lymphs Abs: 2.2 10*3/uL (ref 0.7–4.0)
MCH: 33.3 pg (ref 26.0–34.0)
MCHC: 35.7 g/dL (ref 30.0–36.0)
MCV: 93.1 fL (ref 78.0–100.0)
MONO ABS: 0.5 10*3/uL (ref 0.1–1.0)
MONOS PCT: 9 % (ref 3–12)
Neutro Abs: 3.2 10*3/uL (ref 1.7–7.7)
Neutrophils Relative %: 53 % (ref 43–77)
Platelets: 162 10*3/uL (ref 150–400)
RBC: 4.81 MIL/uL (ref 4.22–5.81)
RDW: 13.2 % (ref 11.5–15.5)
WBC: 6.1 10*3/uL (ref 4.0–10.5)

## 2014-06-08 NOTE — Progress Notes (Signed)
MEDICARE ANNUAL WELLNESS VISIT AND FOLLOW UP Assessment:   1. Essential hypertension - CBC with Differential - BASIC METABOLIC PANEL WITH GFR - Hepatic function panel - TSH  2. DDD (degenerative disc disease), lumbar Continue follow up  3. BPH (benign prostatic hypertrophy) controlled  4. Hyperlipidemia -continue medications, check lipids, decrease fatty foods, increase activity. - Lipid panel  5. Prediabetes Discussed general issues about diabetes pathophysiology and management., Educational material distributed., Suggested low cholesterol diet., Encouraged aerobic exercise., Discussed foot care., Reminded to get yearly retinal exam. - Hemoglobin A1c - Insulin, fasting - HM DIABETES FOOT EXAM  6. Vitamin D deficiency - Vit D  25 hydroxy (rtn osteoporosis monitoring)  7. Glaucoma Continue follow up eye doctor  8. Medication management - Magnesium  9. Obesity Obesity with co morbidities- long discussion about weight loss, diet, and exercise  10. Routine general medical examination at a health care facility  11. Screening for depression negative  12. At moderate risk for fall Fell with LOC/head injury requiring sutures due to medicaiton  13. Need for prophylactic vaccination against Streptococcus pneumoniae (pneumococcus) - Pneumococcal conjugate vaccine 13-valent IM   Plan:   During the course of the visit the patient was educated and counseled about appropriate screening and preventive services including:    Pneumococcal vaccine   Influenza vaccine  Td vaccine  Screening electrocardiogram  Colorectal cancer screening  Diabetes screening  Glaucoma screening  Nutrition counseling   Screening recommendations, referrals: Vaccinations: Please see documentation below and orders this visit.  Nutrition assessed and recommended  Colonoscopy up to date Recommended yearly ophthalmology/optometry visit for glaucoma screening and checkup Recommended  yearly dental visit for hygiene and checkup Advanced directives - requested  Conditions/risks identified: BMI: Discussed weight loss, diet, and increase physical activity.  Increase physical activity: AHA recommends 150 minutes of physical activity a week.  Medications reviewed Diabetes is at goal, ACE/ARB therapy: Yes. Urinary Incontinence is not an issue: discussed non pharmacology and pharmacology options.  Fall risk: moderate- discussed PT, home fall assessment, medications.    Subjective:  Cody Hamilton is a 73 y.o. male who presents for Medicare Annual Wellness Visit and 3 month follow up for HTN, hyperlipidemia, prediabetes, and vitamin D Def.  Date of last medicare wellness visit was is unknown.  His blood pressure has been controlled at home, today their BP is BP: 120/78 mmHg He does not workout. He denies chest pain, shortness of breath, dizziness.  He is on cholesterol medication, on a statin and denies myalgias. His cholesterol is at goal. The cholesterol last visit was:   Lab Results  Component Value Date   CHOL 166 03/02/2014   HDL 52 03/02/2014   LDLCALC 91 03/02/2014   TRIG 115 03/02/2014   CHOLHDL 3.2 03/02/2014   He has been working on diet and exercise for prediabetes, and denies paresthesia of the feet, polydipsia, polyuria and visual disturbances. Last A1C in the office was:  Lab Results  Component Value Date   HGBA1C 5.6 03/02/2014   Patient is on Vitamin D supplement.   Lab Results  Component Value Date   VD25OH 63 03/02/2014      Names of Other Physician/Practitioners you currently use: 1. [facility] here for primary care 2. Dr. Herbert Deaner eye doctor, spring this year, goes twice a year due to [place], had two procedures 3. Dr. Ezzie Dural, dentist, last visit  Patient Care Team: Unk Pinto, MD as PCP - General (Internal Medicine)  Dr. Mina Marble,  ortho Dr. Katherina Right, Ortho Dr. Oletta Lamas, GI  Medication  Review: Current Outpatient Prescriptions on File Prior to Visit  Medication Sig Dispense Refill  . aspirin 81 MG EC tablet Take 81 mg by mouth daily.      . Cholecalciferol (VITAMIN D3) 10000 UNITS capsule Take 10,000 Units by mouth every other day.     . enalapril (VASOTEC) 20 MG tablet TAKE 1 TABLET BY MOUTH EVERY DAY FOR BLOOD PRESSURE 90 tablet PRN  . hydrochlorothiazide (HYDRODIURIL) 12.5 MG tablet Take 12.5 mg by mouth daily.      . magnesium oxide (MAG-OX) 400 MG tablet Take 400 mg by mouth 2 (two) times daily.     . Multiple Vitamin (MULTIVITAMIN) tablet Take 1 tablet by mouth daily.      . Omega-3 Fatty Acids (FISH OIL) 1200 MG CAPS Take 1 capsule by mouth 2 (two) times daily.     . pravastatin (PRAVACHOL) 40 MG tablet TAKE 1 TABLET BY MOUTH EVERY NIGHT AT BEDTIME FOR CHOLESTEROL 90 tablet PRN  . vitamin C (ASCORBIC ACID) 500 MG tablet Take 500 mg by mouth daily.      . vitamin E 400 UNIT capsule Take 400 Units by mouth daily.       No current facility-administered medications on file prior to visit.    Current Problems (verified) Patient Active Problem List   Diagnosis Date Noted  . Encounter for long-term (current) use of other medications 11/28/2013  . Hypertension   . Prediabetes   . Vitamin D deficiency   . BPH (benign prostatic hypertrophy)   . Glaucoma   . DDD (degenerative disc disease)   . Hyperlipidemia 01/10/2011    Screening Tests Health Maintenance  Topic Date Due  . COLONOSCOPY  01/06/1992  . ZOSTAVAX  01/05/2002  . INFLUENZA VACCINE  02/28/2014  . TETANUS/TDAP  08/10/2019  . PNEUMOCOCCAL POLYSACCHARIDE VACCINE AGE 2 AND OVER  Completed    Immunization History  Administered Date(s) Administered  . Pneumococcal Polysaccharide-23 08/09/2009  . Td 08/09/2009    Preventative care: Last colonoscopy: 09/2007 due 10 years Dr. Oletta Lamas Heart cath 1999 normal CT head 10/2013 Stress Echo 12/2010 EF 60%  Prior vaccinations: TD or Tdap: 2011 Influenza:  2015 through fire department Pneumococcal: 2011 Prevnar 13: DUE Shingles/Zostavax: check price  History reviewed: allergies, current medications, past family history, past medical history, past social history, past surgical history and problem list   Risk Factors: Tobacco History  Substance Use Topics  . Smoking status: Former Smoker    Quit date: 07/31/2001  . Smokeless tobacco: Not on file  . Alcohol Use: No   He does not smoke.  Patient is a former smoker. Are there smokers in your home (other than you)?  No  Alcohol Current alcohol use: none  Caffeine Current caffeine use: coffee 1 /day  Exercise Current exercise: none  Nutrition/Diet Current diet: in general, a "healthy" diet    Cardiac risk factors: advanced age (older than 5 for men, 50 for women), dyslipidemia, family history of premature cardiovascular disease, hypertension, male gender, obesity (BMI >= 30 kg/m2) and sedentary lifestyle.  Depression Screen (Note: if answer to either of the following is "Yes", a more complete depression screening is indicated)   Q1: Over the past two weeks, have you felt down, depressed or hopeless? No  Q2: Over the past two weeks, have you felt little interest or pleasure in doing things? No  Have you lost interest or pleasure in daily life? No  Do you  often feel hopeless? No  Do you cry easily over simple problems? No  Activities of Daily Living In your present state of health, do you have any difficulty performing the following activities?:  Driving? No Managing money?  No Feeding yourself? No Getting from bed to chair? No Climbing a flight of stairs? No Preparing food and eating?: No Bathing or showering? No Getting dressed: No Getting to the toilet? No Using the toilet:No Moving around from place to place: No In the past year have you fallen or had a near fall?:Yes   Are you sexually active?  Yes  Do you have more than one partner?  No  Vision  Difficulties: No  Hearing Difficulties: Yes Do you often ask people to speak up or repeat themselves? Yes Do you experience ringing or noises in your ears? No Do you have difficulty understanding soft or whispered voices? Yes  Cognition  Do you feel that you have a problem with memory?No  Do you often misplace items? No  Do you feel safe at home?  Yes  Advanced directives Does patient have a [place]? Yes Does patient have a Living Will? Yes   Objective:   Blood pressure 120/78, pulse 84, temperature 97.7 F (36.5 C), resp. rate 16, height 6' 3.75" (1.924 m), weight 251 lb (113.853 kg). Body mass index is 30.76 kg/(m^2).  General appearance: alert, no distress, WD/WN, male Cognitive Testing  Alert? Yes  Normal Appearance?Yes  Oriented to person? Yes  Place? Yes   Time? Yes  Recall of three objects?  Yes  Can perform simple calculations? Yes  Displays appropriate judgment?Yes  Can read the correct time from a watch face?Yes  HEENT: normocephalic, sclerae anicteric, TMs pearly, nares patent, no discharge or erythema, pharynx normal Oral cavity: MMM, no lesions Neck: supple, no lymphadenopathy, no thyromegaly, no masses Heart: RRR, normal S1, S2, no murmurs Lungs: CTA bilaterally, no wheezes, rhonchi, or rales Abdomen: +bs, soft, non tender, non distended, no masses, no hepatomegaly, no splenomegaly Musculoskeletal: nontender, no swelling, no obvious deformity Extremities: no edema, no cyanosis, no clubbing Pulses: 2+ symmetric, upper and lower extremities, normal cap refill Neurological: alert, oriented x 3, CN2-12 intact, strength normal upper extremities and lower extremities, sensation normal throughout, DTRs 2+ throughout, no cerebellar signs, gait normal Psychiatric: normal affect, behavior normal, pleasant   Medicare Attestation I have personally reviewed: The patient's medical and social history Their use of alcohol, tobacco or illicit  drugs Their current medications and supplements The patient's functional ability including ADLs,fall risks, home safety risks, cognitive, and hearing and visual impairment Diet and physical activities Evidence for depression or mood disorders  The patient's weight, height, BMI, and visual acuity have been recorded in the chart.  I have made referrals, counseling, and provided education to the patient based on review of the above and I have provided the patient with a written personalized care plan for preventive services.     Vicie Mutters, PA-C   06/08/2014

## 2014-06-08 NOTE — Patient Instructions (Signed)
Bad carbs also include fruit juice, alcohol, and sweet tea. These are empty calories that do not signal to your brain that you are full.   Please remember the good carbs are still carbs which convert into sugar. So please measure them out no more than 1/2-1 cup of rice, oatmeal, pasta, and beans  Veggies are however free foods! Pile them on.   Not all fruit is created equal. Please see the list below, the fruit at the bottom is higher in sugars than the fruit at the top. Please avoid all dried fruits.    Preventative Care for Adults, Male       REGULAR HEALTH EXAMS:  A routine yearly physical is a good way to check in with your primary care provider about your health and preventive screening. It is also an opportunity to share updates about your health and any concerns you have, and receive a thorough all-over exam.   Most health insurance companies pay for at least some preventative services.  Check with your health plan for specific coverages.  WHAT PREVENTATIVE SERVICES DO MEN NEED?  Adult men should have their weight and blood pressure checked regularly.   Men age 72 and older should have their cholesterol levels checked regularly.  Beginning at age 4 and continuing to age 21, men should be screened for colorectal cancer.  Certain people should may need continued testing until age 90.  Other cancer screening may include exams for testicular and prostate cancer.  Updating vaccinations is part of preventative care.  Vaccinations help protect against diseases such as the flu.  Lab tests are generally done as part of preventative care to screen for anemia and blood disorders, to screen for problems with the kidneys and liver, to screen for bladder problems, to check blood sugar, and to check your cholesterol level.  Preventative services generally include counseling about diet, exercise, avoiding tobacco, drugs, excessive alcohol consumption, and sexually transmitted infections.     GENERAL RECOMMENDATIONS FOR GOOD HEALTH:  Healthy diet:  Eat a variety of foods, including fruit, vegetables, animal or vegetable protein, such as meat, fish, chicken, and eggs, or beans, lentils, tofu, and grains, such as rice.  Drink plenty of water daily.  Decrease saturated fat in the diet, avoid lots of red meat, processed foods, sweets, fast foods, and fried foods.  Exercise:  Aerobic exercise helps maintain good heart health. At least 30-40 minutes of moderate-intensity exercise is recommended. For example, a brisk walk that increases your heart rate and breathing. This should be done on most days of the week.   Find a type of exercise or a variety of exercises that you enjoy so that it becomes a part of your daily life.  Examples are running, walking, swimming, water aerobics, and biking.  For motivation and support, explore group exercise such as aerobic class, spin class, Zumba, Yoga,or  martial arts, etc.    Set exercise goals for yourself, such as a certain weight goal, walk or run in a race such as a 5k walk/run.  Speak to your primary care provider about exercise goals.  Disease prevention:  If you smoke or chew tobacco, find out from your caregiver how to quit. It can literally save your life, no matter how long you have been a tobacco user. If you do not use tobacco, never begin.   Maintain a healthy diet and normal weight. Increased weight leads to problems with blood pressure and diabetes.   The Body Mass Index  or BMI is a way of measuring how much of your body is fat. Having a BMI above 27 increases the risk of heart disease, diabetes, hypertension, stroke and other problems related to obesity. Your caregiver can help determine your BMI and based on it develop an exercise and dietary program to help you achieve or maintain this important measurement at a healthful level.  High blood pressure causes heart and blood vessel problems.  Persistent high blood pressure  should be treated with medicine if weight loss and exercise do not work.   Fat and cholesterol leaves deposits in your arteries that can block them. This causes heart disease and vessel disease elsewhere in your body.  If your cholesterol is found to be high, or if you have heart disease or certain other medical conditions, then you may need to have your cholesterol monitored frequently and be treated with medication.   Ask if you should have a stress test if your history suggests this. A stress test is a test done on a treadmill that looks for heart disease. This test can find disease prior to there being a problem.  Avoid drinking alcohol in excess (more than two drinks per day).  Avoid use of street drugs. Do not share needles with anyone. Ask for professional help if you need assistance or instructions on stopping the use of alcohol, cigarettes, and/or drugs.  Brush your teeth twice a day with fluoride toothpaste, and floss once a day. Good oral hygiene prevents tooth decay and gum disease. The problems can be painful, unattractive, and can cause other health problems. Visit your dentist for a routine oral and dental check up and preventive care every 6-12 months.   Look at your skin regularly.  Use a mirror to look at your back. Notify your caregivers of changes in moles, especially if there are changes in shapes, colors, a size larger than a pencil eraser, an irregular border, or development of new moles.  Safety:  Use seatbelts 100% of the time, whether driving or as a passenger.  Use safety devices such as hearing protection if you work in environments with loud noise or significant background noise.  Use safety glasses when doing any work that could send debris in to the eyes.  Use a helmet if you ride a bike or motorcycle.  Use appropriate safety gear for contact sports.  Talk to your caregiver about gun safety.  Use sunscreen with a SPF (or skin protection factor) of 15 or greater.   Lighter skinned people are at a greater risk of skin cancer. Don't forget to also wear sunglasses in order to protect your eyes from too much damaging sunlight. Damaging sunlight can accelerate cataract formation.   Practice safe sex. Use condoms. Condoms are used for birth control and to help reduce the spread of sexually transmitted infections (or STIs).  Some of the STIs are gonorrhea (the clap), chlamydia, syphilis, trichomonas, herpes, HPV (human papilloma virus) and HIV (human immunodeficiency virus) which causes AIDS. The herpes, HIV and HPV are viral illnesses that have no cure. These can result in disability, cancer and death.   Keep carbon monoxide and smoke detectors in your home functioning at all times. Change the batteries every 6 months or use a model that plugs into the wall.   Vaccinations:  Stay up to date with your tetanus shots and other required immunizations. You should have a booster for tetanus every 10 years. Be sure to get your flu shot every year, since  5%-20% of the [place] population comes down with the flu. The flu vaccine changes each year, so being vaccinated once is not enough. Get your shot in the fall, before the flu season peaks.   Other vaccines to consider:  Pneumococcal vaccine to protect against certain types of pneumonia.  This is normally recommended for adults age 2 or older.  However, adults younger than 72 years old with certain underlying conditions such as diabetes, heart or lung disease should also receive the vaccine.  Shingles vaccine to protect against Varicella Zoster if you are older than age 19, or younger than 72 years old with certain underlying illness.  Hepatitis A vaccine to protect against a form of infection of the liver by a virus acquired from food.  Hepatitis B vaccine to protect against a form of infection of the liver by a virus acquired from blood or body fluids, particularly if you work in health care.  If you plan to travel  internationally, check with your local health department for specific vaccination recommendations.  Cancer Screening:  Most routine colon cancer screening begins at the age of 72. On a yearly basis, doctors may provide special easy to use take-home tests to check for hidden blood in the stool. Sigmoidoscopy or colonoscopy can detect the earliest forms of colon cancer and is life saving. These tests use a small camera at the end of a tube to directly examine the colon. Speak to your caregiver about this at age 58, when routine screening begins (and is repeated every 5 years unless early forms of pre-cancerous polyps or small growths are found).   At the age of 28 men usually start screening for prostate cancer every year. Screening may begin at a younger age for those with higher risk. Those at higher risk include African-Americans or having a family history of prostate cancer. There are two types of tests for prostate cancer:   Prostate-specific antigen (PSA) testing. Recent studies raise questions about prostate cancer using PSA and you should discuss this with your caregiver.   Digital rectal exam (in which your doctor's lubricated and gloved finger feels for enlargement of the prostate through the anus).   Screening for testicular cancer.  Do a monthly exam of your testicles. Gently roll each testicle between your thumb and fingers, feeling for any abnormal lumps. The best time to do this is after a hot shower or bath when the tissues are looser. Notify your caregivers of any lumps, tenderness or changes in size or shape immediately.

## 2014-06-09 LAB — LIPID PANEL
Cholesterol: 176 mg/dL (ref 0–200)
HDL: 53 mg/dL (ref >39)
LDL Cholesterol: 98 mg/dL (ref 0–99)
Total CHOL/HDL Ratio: 3.3 Ratio
Triglycerides: 123 mg/dL (ref <150)
VLDL: 25 mg/dL (ref 0–40)

## 2014-06-09 LAB — HEPATIC FUNCTION PANEL
ALBUMIN: 4.4 g/dL (ref 3.5–5.2)
ALT: 23 U/L (ref 0–53)
AST: 23 U/L (ref 0–37)
Alkaline Phosphatase: 54 U/L (ref 39–117)
BILIRUBIN INDIRECT: 0.8 mg/dL (ref 0.2–1.2)
Bilirubin, Direct: 0.2 mg/dL (ref 0.0–0.3)
TOTAL PROTEIN: 6.6 g/dL (ref 6.0–8.3)
Total Bilirubin: 1 mg/dL (ref 0.2–1.2)

## 2014-06-09 LAB — BASIC METABOLIC PANEL WITH GFR
BUN: 14 mg/dL (ref 6–23)
CO2: 28 mEq/L (ref 19–32)
CREATININE: 0.93 mg/dL (ref 0.50–1.35)
Calcium: 9.2 mg/dL (ref 8.4–10.5)
Chloride: 99 mEq/L (ref 96–112)
GFR, Est African American: >89 mL/min
GFR, Est Non African American: 82 mL/min
Glucose, Bld: 96 mg/dL (ref 70–99)
Potassium: 4.2 mEq/L (ref 3.5–5.3)
Sodium: 138 mEq/L (ref 135–145)

## 2014-06-09 LAB — INSULIN, FASTING: Insulin fasting, serum: 12.8 u[IU]/mL (ref 2.0–19.6)

## 2014-06-09 LAB — MAGNESIUM: Magnesium: 1.9 mg/dL (ref 1.5–2.5)

## 2014-06-09 LAB — TSH: TSH: 2.075 u[IU]/mL (ref 0.350–4.500)

## 2014-06-09 LAB — HEMOGLOBIN A1C
Hgb A1c MFr Bld: 5.7 % — ABNORMAL HIGH (ref <5.7)
MEAN PLASMA GLUCOSE: 117 mg/dL — AB (ref <117)

## 2014-06-09 LAB — VITAMIN D 25 HYDROXY (VIT D DEFICIENCY, FRACTURES): VIT D 25 HYDROXY: 71 ng/mL (ref 30–89)

## 2014-07-22 ENCOUNTER — Other Ambulatory Visit: Payer: Self-pay | Admitting: Internal Medicine

## 2014-08-10 ENCOUNTER — Other Ambulatory Visit: Payer: Self-pay | Admitting: Internal Medicine

## 2014-08-25 ENCOUNTER — Encounter: Payer: Self-pay | Admitting: Internal Medicine

## 2014-09-11 ENCOUNTER — Ambulatory Visit (INDEPENDENT_AMBULATORY_CARE_PROVIDER_SITE_OTHER): Payer: 59 | Admitting: Internal Medicine

## 2014-09-11 ENCOUNTER — Encounter: Payer: Self-pay | Admitting: Internal Medicine

## 2014-09-11 VITALS — BP 136/76 | HR 60 | Temp 97.9°F | Resp 16 | Ht 75.5 in | Wt 252.6 lb

## 2014-09-11 DIAGNOSIS — R7309 Other abnormal glucose: Secondary | ICD-10-CM

## 2014-09-11 DIAGNOSIS — Z79899 Other long term (current) drug therapy: Secondary | ICD-10-CM | POA: Diagnosis not present

## 2014-09-11 DIAGNOSIS — N400 Enlarged prostate without lower urinary tract symptoms: Secondary | ICD-10-CM

## 2014-09-11 DIAGNOSIS — F32A Depression, unspecified: Secondary | ICD-10-CM

## 2014-09-11 DIAGNOSIS — E782 Mixed hyperlipidemia: Secondary | ICD-10-CM

## 2014-09-11 DIAGNOSIS — Z9181 History of falling: Secondary | ICD-10-CM

## 2014-09-11 DIAGNOSIS — E559 Vitamin D deficiency, unspecified: Secondary | ICD-10-CM

## 2014-09-11 DIAGNOSIS — M5136 Other intervertebral disc degeneration, lumbar region: Secondary | ICD-10-CM

## 2014-09-11 DIAGNOSIS — H409 Unspecified glaucoma: Secondary | ICD-10-CM

## 2014-09-11 DIAGNOSIS — Z1212 Encounter for screening for malignant neoplasm of rectum: Secondary | ICD-10-CM

## 2014-09-11 DIAGNOSIS — Z0000 Encounter for general adult medical examination without abnormal findings: Secondary | ICD-10-CM

## 2014-09-11 DIAGNOSIS — R6889 Other general symptoms and signs: Secondary | ICD-10-CM | POA: Diagnosis not present

## 2014-09-11 DIAGNOSIS — I10 Essential (primary) hypertension: Secondary | ICD-10-CM

## 2014-09-11 DIAGNOSIS — Z0001 Encounter for general adult medical examination with abnormal findings: Secondary | ICD-10-CM | POA: Diagnosis not present

## 2014-09-11 DIAGNOSIS — E669 Obesity, unspecified: Secondary | ICD-10-CM | POA: Diagnosis not present

## 2014-09-11 DIAGNOSIS — F325 Major depressive disorder, single episode, in full remission: Secondary | ICD-10-CM | POA: Insufficient documentation

## 2014-09-11 DIAGNOSIS — F329 Major depressive disorder, single episode, unspecified: Secondary | ICD-10-CM

## 2014-09-11 DIAGNOSIS — Z125 Encounter for screening for malignant neoplasm of prostate: Secondary | ICD-10-CM | POA: Diagnosis not present

## 2014-09-11 DIAGNOSIS — Z1331 Encounter for screening for depression: Secondary | ICD-10-CM

## 2014-09-11 LAB — CBC WITH DIFFERENTIAL/PLATELET
Basophils Absolute: 0 10*3/uL (ref 0.0–0.1)
Basophils Relative: 0 % (ref 0–1)
Eosinophils Absolute: 0.1 10*3/uL (ref 0.0–0.7)
Eosinophils Relative: 2 % (ref 0–5)
HCT: 45.2 % (ref 39.0–52.0)
HEMOGLOBIN: 15.5 g/dL (ref 13.0–17.0)
LYMPHS PCT: 39 % (ref 12–46)
Lymphs Abs: 2.5 10*3/uL (ref 0.7–4.0)
MCH: 32.4 pg (ref 26.0–34.0)
MCHC: 34.3 g/dL (ref 30.0–36.0)
MCV: 94.6 fL (ref 78.0–100.0)
MONOS PCT: 10 % (ref 3–12)
MPV: 9.9 fL (ref 8.6–12.4)
Monocytes Absolute: 0.6 10*3/uL (ref 0.1–1.0)
NEUTROS ABS: 3.1 10*3/uL (ref 1.7–7.7)
NEUTROS PCT: 49 % (ref 43–77)
Platelets: 160 10*3/uL (ref 150–400)
RBC: 4.78 MIL/uL (ref 4.22–5.81)
RDW: 13.4 % (ref 11.5–15.5)
WBC: 6.3 10*3/uL (ref 4.0–10.5)

## 2014-09-11 NOTE — Progress Notes (Signed)
Patient ID: Cody Hamilton, male   DOB: 02-Oct-1940, 74 y.o.   MRN: [id number]  [facility] VISIT AND CPE  Assessment:   1. Essential hypertension  - Microalbumin / creatinine urine ratio - EKG 12-Lead - [place], RETROPERITNL ABD,  LTD - TSH  2. Hyperlipidemia  - Lipid panel  3. Obesity   4. Prediabetes  - Hemoglobin A1c - Insulin, fasting  5. Vitamin D deficiency  - Vit D  25 hydroxy (rtn osteoporosis monitoring)  6. DDD (degenerative disc disease), lumbar   7. BPH (benign prostatic hypertrophy)  8. Glaucoma   9. Screening for rectal cancer  - POC Hemoccult Bld/Stl (3-Cd Home Screen); Future  10. Prostate cancer screening  - PSA  11. Depression screen   12. Depression   13. At low risk for fall   14. Medication management  - Urine Microscopic - CBC with Differential/Platelet - BASIC METABOLIC PANEL WITH GFR - Hepatic function panel - Magnesium      Plan:   During the course of the visit the patient was educated and counseled about appropriate screening and preventive services including:    Pneumococcal vaccine   Influenza vaccine  Td vaccine  Screening electrocardiogram  Bone densitometry screening  Colorectal cancer screening  Diabetes screening  Glaucoma screening  Nutrition counseling   Advanced directives: requested  Screening recommendations, referrals: Vaccinations: Immunization History  Administered Date(s) Administered  . Influenza-Unspecified 04/30/2014  . Pneumococcal Conjugate-13 06/08/2014  . Pneumococcal Polysaccharide-23 08/09/2009  . Td 08/09/2009   Shingles vaccine declined Hep B vaccine yrs ago when worked for [employer]  Nutrition assessed and recommended  Colonoscopy 2009 - Dr Thana Ates- 10 yr f/u in 2019 Recommended yearly ophthalmology/optometry visit for glaucoma screening and checkup Recommended yearly dental visit for hygiene and checkup Advanced directives - yes  Conditions/risks  identified: BMI: Discussed weight loss, diet, and increase physical activity.  Increase physical activity: AHA recommends 150 minutes of physical activity a week.  Medications reviewed PREDiabetes is at goal, ACE/ARB therapy: Yes. Urinary Incontinence is not an issue: discussed non pharmacology and pharmacology options.  Fall risk: low- discussed PT, home fall assessment, medications.   Subjective:  Cody Hamilton is a 74 y.o. mwm who presents for Medicare Annual Wellness Visit and complete physical.  Date of last medicare wellness visit is 06/08/2014.  He has had elevated blood pressure since 1990. His blood pressure has been controlled at home, today their BP is BP: 136/76 mmHg.  He does workout. He denies chest pain, shortness of breath, dizziness.   He is on cholesterol medication and denies myalgias. His cholesterol is at goal. The cholesterol last visit was:  Lab Results  Component Value Date   CHOL 176 06/08/2014   HDL 53 06/08/2014   LDLCALC 98 06/08/2014   TRIG 123 06/08/2014   CHOLHDL 3.3 06/08/2014   He has had Prediabetes for 5 years since Jan 2011 with an A1c 5.7%. He has been working on diet and exercise for prediabetes, and denies foot ulcerations, hyperglycemia, hypoglycemia , paresthesia of the feet, polydipsia, polyuria and visual disturbances. Last A1C in the office was:  Lab Results  Component Value Date   HGBA1C 5.7* 06/08/2014   Patient is on Vitamin D supplement.  (Vit D 36 in 2008)  Lab Results  Component Value Date   VD25OH 71 06/08/2014     Names of Other Physician/Practitioners you currently use: 1. [facility] here for primary care 2. Dr Herbert Deaner, eye doctor,  last visit scheduled Feb 2016 3. Dr Sheilah Mins, DDS, dentist, last visit 6 months ago  Patient Care Team: Unk Pinto, MD as PCP - General (Internal Medicine) Josue Hector, MD as Consulting Physician (Cardiology) Winfield Cunas., MD as  Consulting Physician (Gastroenterology)  Medication Review: Medication Sig  . ALPRAZolam (XANAX) 1 MG tablet TAKE 1/2 TO 1 TABLET BY MOUTH THREE TIMES DAILY AS NEEDED FOR ANXIETY OR SLEEP.  Marland Kitchen aspirin 81 MG EC tablet Take 81 mg by mouth daily.    . Cholecalciferol (VITAMIN D3) 10000 UNITS capsule Take 10,000 Units by mouth every other day.   . enalapril (VASOTEC) 20 MG tablet TAKE 1 TABLET BY MOUTH EVERY DAY FOR BLOOD PRESSURE  . hydrochlorothiazide (HYDRODIURIL) 12.5 MG tablet TAKE 1 TABLET BY MOUTH EVERY MORNING FOR BLOOD PRESSURE  . magnesium oxide (MAG-OX) 400 MG tablet Take 400 mg by mouth 2 (two) times daily.   . Multiple Vitamin (MULTIVITAMIN) tablet Take 1 tablet by mouth daily.    . Omega-3 Fatty Acids (FISH OIL) 1200 MG CAPS Take 1 capsule by mouth 2 (two) times daily.   . pravastatin (PRAVACHOL) 40 MG tablet TAKE 1 TABLET BY MOUTH EVERY NIGHT AT BEDTIME FOR CHOLESTEROL  . vitamin C (ASCORBIC ACID) 500 MG tablet Take 500 mg by mouth daily.    . vitamin E 400 UNIT capsule Take 400 Units by mouth daily.     Current Problems (verified) Patient Active Problem List   Diagnosis Date Noted  . Depression 09/11/2014  . Obesity 06/08/2014  . Medication management 11/28/2013  . Hypertension   . Prediabetes   . Vitamin D deficiency   . BPH (benign prostatic hypertrophy)   . Glaucoma   . DDD (degenerative disc disease), lumbar   . Hyperlipidemia 01/10/2011   Screening Tests Health Maintenance  Topic Date Due  . COLONOSCOPY  01/06/1992  . ZOSTAVAX  01/05/2002  . INFLUENZA VACCINE  02/28/2014  . TETANUS/TDAP  08/10/2019  . PNEUMOCOCCAL POLYSACCHARIDE VACCINE AGE 73 AND OVER  Completed    Immunization History  Administered Date(s) Administered  . Influenza-Unspecified 04/30/2014  . Pneumococcal Conjugate-13 06/08/2014  . Pneumococcal Polysaccharide-23 08/09/2009  . Td 08/09/2009    Preventative care: Last colonoscopy: 2009 - Dr Jacklynn Lewis - 64 yr f/u**  History reviewed:  allergies, current medications, past family history, past medical history, past social history, past surgical history and problem list  Risk Factors: Tobacco History  Substance Use Topics  . Smoking status: Former Smoker    Quit date: 07/31/2001  . Smokeless tobacco: Not on file  . Alcohol Use: No   He does not smoke.  Patient is a former smoker. Are there smokers in your home (other than you)?  No  Alcohol Current alcohol use: none  Caffeine Current caffeine use: coffee 3 cups /day  Exercise Current exercise: cardiovascular workout on exercise equipment, walking and yard work  Nutrition/Diet Current diet: in general, a "healthy" diet    Cardiac risk factors: advanced age (older than 34 for men, 41 for women), dyslipidemia, hypertension, male gender, obesity (BMI >= 30 kg/m2), sedentary lifestyle and smoking/ tobacco exposure.  Depression Screen (Note: if answer to either of the following is "Yes", a more complete depression screening is indicated)   Q1: Over the past two weeks, have you felt down, depressed or hopeless? No  Q2: Over the past two weeks, have you felt little interest or pleasure in doing things? No  Have you lost interest or pleasure in daily  life? No  Do you often feel hopeless? No  Do you cry easily over simple problems? No  Activities of Daily Living In your present state of health, do you have any difficulty performing the following activities?:  Driving? No Managing money?  No Feeding yourself? No Getting from bed to chair? No Climbing a flight of stairs? No Preparing food and eating?: No Bathing or showering? No Getting dressed: No Getting to the toilet? No Using the toilet:No Moving around from place to place: No In the past year have you fallen or had a near fall?:No   Are you sexually active?  Yes  Do you have more than one partner?  No  Vision Difficulties: No  Hearing Difficulties: No Do you often ask people to speak up or repeat  themselves? No Do you experience ringing or noises in your ears? No Do you have difficulty understanding soft or whispered voices? No  Cognition  Do you feel that you have a problem with memory?No  Do you often misplace items? No  Do you feel safe at home?  Yes  Advanced directives Does patient have a Edwards? Yes Does patient have a Living Will? Yes  Past Medical History  Diagnosis Date  . Colon polyps   . Inguinal hernia     RIGHT  . Hypertension   . Hyperlipidemia   . Elevated hemoglobin A1c   . Vitamin D deficiency   . BPH (benign prostatic hypertrophy)   . Glaucoma   . DDD (degenerative disc disease)     Past Surgical History  Procedure Laterality Date  . Inguinal hernia repair    . Laparoscopic appendectomy    . Cardiac catheterization  1999   ROS: Constitutional: Denies fever, chills, weight loss/gain, headaches, insomnia, fatigue, night sweats or change in appetite. Eyes: Denies redness, blurred vision, diplopia, discharge, itchy or watery eyes.  ENT: Denies discharge, congestion, post nasal drip, epistaxis, sore throat, earache, hearing loss, dental pain, Tinnitus, Vertigo, Sinus pain or snoring.  Cardio: Denies chest pain, palpitations, irregular heartbeat, syncope, dyspnea, diaphoresis, orthopnea, PND, claudication or edema Respiratory: denies cough, dyspnea, DOE, pleurisy, hoarseness, laryngitis or wheezing.  Gastrointestinal: Denies dysphagia, heartburn, reflux, water brash, pain, cramps, nausea, vomiting, bloating, diarrhea, constipation, hematemesis, melena, hematochezia, jaundice or hemorrhoids Genitourinary: Denies dysuria, frequency, urgency, nocturia, hesitancy, discharge, hematuria or flank pain Musculoskeletal: Denies arthralgia, myalgia, stiffness, Jt. Swelling, pain, limp or strain/sprain. Denies Falls. Skin: Denies puritis, rash, hives, warts, acne, eczema or change in skin lesion Neuro: No weakness, tremor, incoordination,  spasms, paresthesia or pain Psychiatric: Denies confusion, memory loss or sensory loss. Denies Depression. Endocrine: Denies change in weight, skin, hair change, nocturia, and paresthesia, diabetic polys, visual blurring or hyper / hypo glycemic episodes.  Heme/Lymph: No excessive bleeding, bruising or enlarged lymph nodes.  Objective:     BP 136/76   Pulse 60  Temp 97.9 F   Resp 16  Ht 6' 3.5"  Wt 252 lb 9.6 oz     BMI 31.15   General Appearance:  Alert  WD/WN, male , in no apparent distress. Eyes: PERRLA, EOMs, conjunctiva no swelling or erythema, normal fundi and vessels. Sinuses: No frontal/maxillary tenderness ENT/Mouth: EACs patent / TMs  nl. Nares clear without erythema, swelling, mucoid exudates. Oral hygiene is good. No erythema, swelling, or exudate. Tongue normal, non-obstructing. Tonsils not swollen or erythematous. Hearing normal.  Neck: Supple, thyroid normal. No bruits, nodes or JVD. Respiratory: Respiratory effort normal.  BS equal and clear  bilateral without rales, rhonci, wheezing or stridor. Cardio: Heart sounds are normal with regular rate and rhythm and no murmurs, rubs or gallops. Peripheral pulses are normal and equal bilaterally without edema. No aortic or femoral bruits. Chest: symmetric with normal excursions and percussion.  Abdomen: Flat, soft, with nl bowel sounds. Nontender, no guarding, rebound, hernias, masses, or organomegaly.  Lymphatics: Non tender without lymphadenopathy.  Genitourinary: No hernias.Testes nl. DRE - prostate nl for age - smooth & firm w/o nodules. Musculoskeletal: Full ROM all peripheral extremities, joint stability, 5/5 strength, and normal gait. Skin: Warm and dry without rashes, lesions, cyanosis, clubbing or  ecchymosis.  Neuro: Cranial nerves intact, reflexes equal bilaterally. Normal muscle tone, no cerebellar symptoms. Sensation intact.  Pysch: Awake and oriented X 3 with normal affect, insight and judgment appropriate.    Cognitive Testing  Alert? Yes  Normal Appearance? Yes  Oriented to person? Yes  Place? Yes   Time? Yes  Recall of three objects?  Yes  Can perform simple calculations? Yes  Displays appropriate judgment? Yes  Can read the correct time from a watch/clock? Yes  Medicare Attestation I have personally reviewed: The patient's medical and social history Their use of alcohol, tobacco or illicit drugs Their current medications and supplements The patient's functional ability including ADLs,fall risks, home safety risks, cognitive, and hearing and visual impairment Diet and physical activities Evidence for depression or mood disorders  The patient's weight, height, BMI, and visual acuity have been recorded in the chart.  I have made referrals, counseling, and provided education to the patient based on review of the above and I have provided the patient with a written personalized care plan for preventive services.    Johncarlos Holtsclaw DAVID, MD   09/11/2014

## 2014-09-11 NOTE — Patient Instructions (Signed)
Recommend the book "The END of DIETING" by Dr Excell Seltzer   & the book "The END of DIABETES " by Dr Excell Seltzer  At [email or web address] - get book & Audio CD's      Being diabetic has a  300% increased risk for heart attack, stroke, cancer, and alzheimer- type vascular dementia. It is very important that you work harder with diet by avoiding all foods that are white. Avoid white rice (brown & wild rice is OK), white potatoes (sweetpotatoes in moderation is OK), White bread or wheat bread or anything made out of white flour like bagels, donuts, rolls, buns, biscuits, cakes, pastries, cookies, pizza crust, and pasta (made from white flour & egg whites) - vegetarian pasta or spinach or wheat pasta is OK. Multigrain breads like Arnold's or Pepper[employer], or multigrain sandwich thins or flatbreads.  Diet, exercise and weight loss can reverse and cure diabetes in the early stages.  Diet, exercise and weight loss is very important in the control and prevention of complications of diabetes which affects every system in your body, ie. Brain - dementia/stroke, eyes - glaucoma/blindness, heart - heart attack/heart failure, kidneys - dialysis, stomach - gastric paralysis, intestines - malabsorption, nerves - severe painful neuritis, circulation - gangrene & loss of a leg(s), and finally cancer and Alzheimers.    I recommend avoid fried & greasy foods,  sweets/candy, white rice (brown or wild rice or Quinoa is OK), white potatoes (sweet potatoes are OK) - anything made from white flour - bagels, doughnuts, rolls, buns, biscuits,white and wheat breads, pizza crust and traditional pasta made of white flour & egg white(vegetarian pasta or spinach or wheat pasta is OK).  Multi-grain bread is OK - like multi-grain flat bread or sandwich thins. Avoid alcohol in excess. Exercise is also important.    Eat all the vegetables you want - avoid meat, especially red meat and dairy - especially cheese.  Cheese is the most  concentrated form of trans-fats which is the worst thing to clog up our arteries. Veggie cheese is OK which can be found in the fresh produce section at [employer] or [employer] or [employer]  Preventative Care for Adults - Male      MAINTAIN REGULAR HEALTH EXAMS:  A routine yearly physical is a good way to check in with your primary care provider about your health and preventive screening. It is also an opportunity to share updates about your health and any concerns you have, and receive a thorough all-over exam.   Most health insurance companies pay for at least some preventative services.  Check with your health plan for specific coverages.  WHAT PREVENTATIVE SERVICES DO WOMEN NEED?  Adult women should have their weight and blood pressure checked regularly.   Women age 73 and older should have their cholesterol levels checked regularly.  Women should be screened for cervical cancer with a Pap smear and pelvic exam beginning at either age 44, or 3 years after they become sexually activity.    Breast cancer screening generally begins at age 51 with a mammogram and breast exam by your primary care provider.    Beginning at age 37 and continuing to age 34, women should be screened for colorectal cancer.  Certain people may need continued testing until age 46.  Updating vaccinations is part of preventative care.  Vaccinations help protect against diseases such as the flu.  Osteoporosis is a disease in which the bones lose minerals and strength as we age. Women  ages 77 and over should discuss this with their caregivers, as should women after menopause who have other risk factors.  Lab tests are generally done as part of preventative care to screen for anemia and blood disorders, to screen for problems with the kidneys and liver, to screen for bladder problems, to check blood sugar, and to check your cholesterol level.  Preventative services generally include counseling about diet,  exercise, avoiding tobacco, drugs, excessive alcohol consumption, and sexually transmitted infections.    GENERAL RECOMMENDATIONS FOR GOOD HEALTH:  Healthy diet:  Eat a variety of foods, including fruit, vegetables, animal or vegetable protein, such as meat, fish, chicken, and eggs, or beans, lentils, tofu, and grains, such as rice.  Drink plenty of water daily.  Decrease saturated fat in the diet, avoid lots of red meat, processed foods, sweets, fast foods, and fried foods.  Exercise:  Aerobic exercise helps maintain good heart health. At least 30-40 minutes of moderate-intensity exercise is recommended. For example, a brisk walk that increases your heart rate and breathing. This should be done on most days of the week.   Find a type of exercise or a variety of exercises that you enjoy so that it becomes a part of your daily life.  Examples are running, walking, swimming, water aerobics, and biking.  For motivation and support, explore group exercise such as aerobic class, spin class, Zumba, Yoga,or  martial arts, etc.    Set exercise goals for yourself, such as a certain weight goal, walk or run in a race such as a 5k walk/run.  Speak to your primary care provider about exercise goals.  Disease prevention:  If you smoke or chew tobacco, find out from your caregiver how to quit. It can literally save your life, no matter how long you have been a tobacco user. If you do not use tobacco, never begin.   Maintain a healthy diet and normal weight. Increased weight leads to problems with blood pressure and diabetes.   The Body Mass Index or BMI is a way of measuring how much of your body is fat. Having a BMI above 27 increases the risk of heart disease, diabetes, hypertension, stroke and other problems related to obesity. Your caregiver can help determine your BMI and based on it develop an exercise and dietary program to help you achieve or maintain this important measurement at a healthful  level.  High blood pressure causes heart and blood vessel problems.  Persistent high blood pressure should be treated with medicine if weight loss and exercise do not work.   Fat and cholesterol leaves deposits in your arteries that can block them. This causes heart disease and vessel disease elsewhere in your body.  If your cholesterol is found to be high, or if you have heart disease or certain other medical conditions, then you may need to have your cholesterol monitored frequently and be treated with medication.   Ask if you should have a cardiac stress test if your history suggests this. A stress test is a test done on a treadmill that looks for heart disease. This test can find disease prior to there being a problem.  Menopause can be associated with physical symptoms and risks. Hormone replacement therapy is available to decrease these. You should talk to your caregiver about whether starting or continuing to take hormones is right for you.   Osteoporosis is a disease in which the bones lose minerals and strength as we age. This can result in serious bone  fractures. Risk of osteoporosis can be identified using a bone density scan. Women ages 59 and over should discuss this with their caregivers, as should women after menopause who have other risk factors. Ask your caregiver whether you should be taking a calcium supplement and Vitamin D, to reduce the rate of osteoporosis.   Avoid drinking alcohol in excess (more than two drinks per day).  Avoid use of street drugs. Do not share needles with anyone. Ask for professional help if you need assistance or instructions on stopping the use of alcohol, cigarettes, and/or drugs.  Brush your teeth twice a day with fluoride toothpaste, and floss once a day. Good oral hygiene prevents tooth decay and gum disease. The problems can be painful, unattractive, and can cause other health problems. Visit your dentist for a routine oral and dental check up and  preventive care every 6-12 months.   Look at your skin regularly.  Use a mirror to look at your back. Notify your caregivers of changes in moles, especially if there are changes in shapes, colors, a size larger than a pencil eraser, an irregular border, or development of new moles.  Safety:  Use seatbelts 100% of the time, whether driving or as a passenger.  Use safety devices such as hearing protection if you work in environments with loud noise or significant background noise.  Use safety glasses when doing any work that could send debris in to the eyes.  Use a helmet if you ride a bike or motorcycle.  Use appropriate safety gear for contact sports.  Talk to your caregiver about gun safety.  Use sunscreen with a SPF (or skin protection factor) of 15 or greater.  Lighter skinned people are at a greater risk of skin cancer. Don't forget to also wear sunglasses in order to protect your eyes from too much damaging sunlight. Damaging sunlight can accelerate cataract formation.   Practice safe sex. Use condoms. Condoms are used for birth control and to help reduce the spread of sexually transmitted infections (or STIs).  Some of the STIs are gonorrhea (the clap), chlamydia, syphilis, trichomonas, herpes, HPV (human papilloma virus) and HIV (human immunodeficiency virus) which causes AIDS. The herpes, HIV and HPV are viral illnesses that have no cure. These can result in disability, cancer and death.   Keep carbon monoxide and smoke detectors in your home functioning at all times. Change the batteries every 6 months or use a model that plugs into the wall.   Vaccinations:  Stay up to date with your tetanus shots and other required immunizations. You should have a booster for tetanus every 10 years. Be sure to get your flu shot every year, since 5%-20% of the [place] population comes down with the flu. The flu vaccine changes each year, so being vaccinated once is not enough. Get your shot in the fall, before  the flu season peaks.   Other vaccines to consider:  Human Papilloma Virus or HPV causes cancer of the cervix, and other infections that can be transmitted from person to person. There is a vaccine for HPV, and females should get immunized between the ages of 21 and 53. It requires a series of 3 shots.   Pneumococcal vaccine to protect against certain types of pneumonia.  This is normally recommended for adults age 17 or older.  However, adults younger than 73 years old with certain underlying conditions such as diabetes, heart or lung disease should also receive the vaccine.  Shingles vaccine to protect against  Varicella Zoster if you are older than age 16, or younger than 73 years old with certain underlying illness.  Hepatitis A vaccine to protect against a form of infection of the liver by a virus acquired from food.  Hepatitis B vaccine to protect against a form of infection of the liver by a virus acquired from blood or body fluids, particularly if you work in health care.  If you plan to travel internationally, check with your local health department for specific vaccination recommendations.  Cancer Screening:  Breast cancer screening is essential to preventive care for women. All women age 69 and older should perform a breast self-exam every month. At age 1 and older, women should have their caregiver complete a breast exam each year. Women at ages 53 and older should have a mammogram (x-ray film) of the breasts. Your caregiver can discuss how often you need mammograms.    Cervical cancer screening includes taking a Pap smear (sample of cells examined under a microscope) from the cervix (end of the uterus). It also includes testing for HPV (Human Papilloma Virus, which can cause cervical cancer). Screening and a pelvic exam should begin at age 49, or 3 years after a woman becomes sexually active. Screening should occur every year, with a Pap smear but no HPV testing, up to age 77. After  age 16, you should have a Pap smear every 3 years with HPV testing, if no HPV was found previously.   Most routine colon cancer screening begins at the age of 75. On a yearly basis, doctors may provide special easy to use take-home tests to check for hidden blood in the stool. Sigmoidoscopy or colonoscopy can detect the earliest forms of colon cancer and is life saving. These tests use a small camera at the end of a tube to directly examine the colon. Speak to your caregiver about this at age 67, when routine screening begins (and is repeated every 5 years unless early forms of pre-cancerous polyps or small growths are found).

## 2014-09-12 LAB — BASIC METABOLIC PANEL WITH GFR
BUN: 16 mg/dL (ref 6–23)
CALCIUM: 9.1 mg/dL (ref 8.4–10.5)
CO2: 28 mEq/L (ref 19–32)
CREATININE: 0.93 mg/dL (ref 0.50–1.35)
Chloride: 100 mEq/L (ref 96–112)
GFR, EST NON AFRICAN AMERICAN: 82 mL/min
Glucose, Bld: 100 mg/dL — ABNORMAL HIGH (ref 70–99)
Potassium: 4.4 mEq/L (ref 3.5–5.3)
Sodium: 137 mEq/L (ref 135–145)

## 2014-09-12 LAB — HEMOGLOBIN A1C
Hgb A1c MFr Bld: 5.5 % (ref <5.7)
Mean Plasma Glucose: 111 mg/dL (ref <117)

## 2014-09-12 LAB — LIPID PANEL
Cholesterol: 157 mg/dL (ref 0–200)
HDL: 48 mg/dL (ref >39)
LDL Cholesterol: 92 mg/dL (ref 0–99)
Total CHOL/HDL Ratio: 3.3 Ratio
Triglycerides: 87 mg/dL (ref <150)
VLDL: 17 mg/dL (ref 0–40)

## 2014-09-12 LAB — HEPATIC FUNCTION PANEL
ALBUMIN: 4.1 g/dL (ref 3.5–5.2)
ALK PHOS: 55 U/L (ref 39–117)
ALT: 22 U/L (ref 0–53)
AST: 19 U/L (ref 0–37)
BILIRUBIN DIRECT: 0.2 mg/dL (ref 0.0–0.3)
BILIRUBIN TOTAL: 0.9 mg/dL (ref 0.2–1.2)
Indirect Bilirubin: 0.7 mg/dL (ref 0.2–1.2)
Total Protein: 6.5 g/dL (ref 6.0–8.3)

## 2014-09-12 LAB — URINALYSIS, MICROSCOPIC ONLY
BACTERIA UA: NONE SEEN
Casts: NONE SEEN
Crystals: NONE SEEN
Squamous Epithelial / LPF: NONE SEEN

## 2014-09-12 LAB — MICROALBUMIN / CREATININE URINE RATIO: Creatinine, Urine: 59.3 mg/dL

## 2014-09-12 LAB — MAGNESIUM: Magnesium: 2 mg/dL (ref 1.5–2.5)

## 2014-09-12 LAB — VITAMIN D 25 HYDROXY (VIT D DEFICIENCY, FRACTURES): Vit D, 25-Hydroxy: 46 ng/mL (ref 30–100)

## 2014-09-12 LAB — PSA: PSA: 0.56 ng/mL (ref <=4.00)

## 2014-09-12 LAB — INSULIN, FASTING: Insulin fasting, serum: 13.6 u[IU]/mL (ref 2.0–19.6)

## 2014-09-12 LAB — TSH: TSH: 3.59 u[IU]/mL (ref 0.350–4.500)

## 2014-09-15 DIAGNOSIS — H2513 Age-related nuclear cataract, bilateral: Secondary | ICD-10-CM | POA: Diagnosis not present

## 2014-09-15 DIAGNOSIS — H524 Presbyopia: Secondary | ICD-10-CM | POA: Diagnosis not present

## 2014-09-15 DIAGNOSIS — H402211 Chronic angle-closure glaucoma, right eye, mild stage: Secondary | ICD-10-CM | POA: Diagnosis not present

## 2014-09-15 DIAGNOSIS — H402222 Chronic angle-closure glaucoma, left eye, moderate stage: Secondary | ICD-10-CM | POA: Diagnosis not present

## 2014-10-14 ENCOUNTER — Other Ambulatory Visit: Payer: Self-pay | Admitting: *Deleted

## 2014-10-14 DIAGNOSIS — Z1212 Encounter for screening for malignant neoplasm of rectum: Secondary | ICD-10-CM

## 2014-10-14 LAB — POC HEMOCCULT BLD/STL (HOME/3-CARD/SCREEN)
Card #3 Fecal Occult Blood, POC: NEGATIVE
FECAL OCCULT BLD: NEGATIVE
FECAL OCCULT BLD: NEGATIVE

## 2014-12-16 ENCOUNTER — Other Ambulatory Visit: Payer: Self-pay | Admitting: Physician Assistant

## 2015-01-07 ENCOUNTER — Ambulatory Visit (INDEPENDENT_AMBULATORY_CARE_PROVIDER_SITE_OTHER): Payer: Medicare Other | Admitting: Physician Assistant

## 2015-01-07 ENCOUNTER — Encounter: Payer: Self-pay | Admitting: Physician Assistant

## 2015-01-07 VITALS — BP 138/70 | HR 60 | Temp 97.7°F | Resp 16 | Ht 75.5 in | Wt 252.0 lb

## 2015-01-07 DIAGNOSIS — I10 Essential (primary) hypertension: Secondary | ICD-10-CM

## 2015-01-07 DIAGNOSIS — E559 Vitamin D deficiency, unspecified: Secondary | ICD-10-CM

## 2015-01-07 DIAGNOSIS — R7309 Other abnormal glucose: Secondary | ICD-10-CM

## 2015-01-07 DIAGNOSIS — E782 Mixed hyperlipidemia: Secondary | ICD-10-CM

## 2015-01-07 DIAGNOSIS — Z1331 Encounter for screening for depression: Secondary | ICD-10-CM

## 2015-01-07 DIAGNOSIS — Z79899 Other long term (current) drug therapy: Secondary | ICD-10-CM

## 2015-01-07 DIAGNOSIS — E669 Obesity, unspecified: Secondary | ICD-10-CM

## 2015-01-07 LAB — CBC WITH DIFFERENTIAL/PLATELET
BASOS ABS: 0 10*3/uL (ref 0.0–0.1)
Basophils Relative: 0 % (ref 0–1)
EOS PCT: 2 % (ref 0–5)
Eosinophils Absolute: 0.1 10*3/uL (ref 0.0–0.7)
HEMATOCRIT: 44.3 % (ref 39.0–52.0)
Hemoglobin: 15.3 g/dL (ref 13.0–17.0)
LYMPHS ABS: 1.9 10*3/uL (ref 0.7–4.0)
Lymphocytes Relative: 33 % (ref 12–46)
MCH: 32.6 pg (ref 26.0–34.0)
MCHC: 34.5 g/dL (ref 30.0–36.0)
MCV: 94.5 fL (ref 78.0–100.0)
MPV: 10.5 fL (ref 8.6–12.4)
Monocytes Absolute: 0.5 10*3/uL (ref 0.1–1.0)
Monocytes Relative: 9 % (ref 3–12)
NEUTROS ABS: 3.2 10*3/uL (ref 1.7–7.7)
NEUTROS PCT: 56 % (ref 43–77)
PLATELETS: 165 10*3/uL (ref 150–400)
RBC: 4.69 MIL/uL (ref 4.22–5.81)
RDW: 13.4 % (ref 11.5–15.5)
WBC: 5.7 10*3/uL (ref 4.0–10.5)

## 2015-01-07 NOTE — Progress Notes (Signed)
Assessment and Plan:  1. Hypertension -Continue medication, monitor blood pressure at home. Continue DASH diet.  Reminder to go to the ER if any CP, SOB, nausea, dizziness, severe HA, changes vision/speech, left arm numbness and tingling and jaw pain.  2. Cholesterol -Continue diet and exercise. Check cholesterol.   3. Prediabetes  -Continue diet and exercise. Check A1C  4. Vitamin D Def - check level and continue medications.   5. Obesity with co morbidities - long discussion about weight loss, diet, and exercise  6. Depression screen negative  Continue diet and meds as discussed. Further disposition pending results of labs. Over 30 minutes of exam, counseling, chart review, and critical decision making was performed  HPI 74 y.o. male  presents for 3 month follow up on hypertension, cholesterol, prediabetes, and vitamin D deficiency.   His blood pressure has been controlled at home, today their BP is BP: 138/70 mmHg  He does not workout. He denies chest pain, shortness of breath, dizziness.  He is on cholesterol medication and denies myalgias. His cholesterol is at goal. The cholesterol last visit was:   Lab Results  Component Value Date   CHOL 157 09/11/2014   HDL 48 09/11/2014   LDLCALC 92 09/11/2014   TRIG 87 09/11/2014   CHOLHDL 3.3 09/11/2014    He has been working on diet and exercise for prediabetes x 2011. Follows with Dr. Herbert Deaner, no DM retinopathy, just started on eye drops for glaucoma, and denies paresthesia of the feet, polydipsia, polyuria and visual disturbances. Last A1C in the office was:  Lab Results  Component Value Date   HGBA1C 5.5 09/11/2014   Patient is on Vitamin D supplement.   Lab Results  Component Value Date   VD25OH 46 09/11/2014     BMI is Body mass index is 31.07 kg/(m^2)., he is working on diet and exercise. Wt Readings from Last 3 Encounters:  01/07/15 252 lb (114.306 kg)  09/11/14 252 lb 9.6 oz (114.579 kg)  06/08/14 251 lb (113.853  kg)     Current Medications:  Current Outpatient Prescriptions on File Prior to Visit  Medication Sig Dispense Refill  . ALPRAZolam (XANAX) 1 MG tablet TAKE 1/2 TO 1 TABLET BY MOUTH THREE TIMES DAILY AS NEEDED 90 tablet 0  . aspirin 81 MG EC tablet Take 81 mg by mouth daily.      . Cholecalciferol (VITAMIN D3) 10000 UNITS capsule Take 10,000 Units by mouth every other day.     . Cinnamon 500 MG capsule Take 500 mg by mouth 4 (four) times daily.    . enalapril (VASOTEC) 20 MG tablet TAKE 1 TABLET BY MOUTH EVERY DAY FOR BLOOD PRESSURE 90 tablet PRN  . hydrochlorothiazide (HYDRODIURIL) 12.5 MG tablet TAKE 1 TABLET BY MOUTH EVERY MORNING FOR BLOOD PRESSURE 90 tablet 3  . magnesium oxide (MAG-OX) 400 MG tablet Take 400 mg by mouth 2 (two) times daily.     . Multiple Vitamin (MULTIVITAMIN) tablet Take 1 tablet by mouth daily.      . Omega-3 Fatty Acids (FISH OIL) 1200 MG CAPS Take 1 capsule by mouth 2 (two) times daily.     . pravastatin (PRAVACHOL) 40 MG tablet TAKE 1 TABLET BY MOUTH EVERY NIGHT AT BEDTIME FOR CHOLESTEROL 90 tablet PRN  . vitamin C (ASCORBIC ACID) 500 MG tablet Take 500 mg by mouth daily.      . vitamin E 400 UNIT capsule Take 400 Units by mouth daily.       No  current facility-administered medications on file prior to visit.   Medical History:  Past Medical History  Diagnosis Date  . Colon polyps   . Inguinal hernia     RIGHT  . Hypertension   . Hyperlipidemia   . Elevated hemoglobin A1c   . Vitamin D deficiency   . BPH (benign prostatic hypertrophy)   . Glaucoma   . DDD (degenerative disc disease)    Allergies:  Allergies  Allergen Reactions  . Viagra [Sildenafil Citrate]     Headache    Review of Systems:  Review of Systems  Constitutional: Negative.   HENT: Negative.   Eyes: Negative.   Respiratory: Negative.   Cardiovascular: Negative.   Gastrointestinal: Negative.   Genitourinary: Negative.   Musculoskeletal: Negative.   Skin: Negative.    Neurological: Negative.   Endo/Heme/Allergies: Negative.   Psychiatric/Behavioral: Negative.     Family history- Review and unchanged Social history- Review and unchanged Physical Exam: BP 138/70 mmHg  Pulse 60  Temp(Src) 97.7 F (36.5 C)  Resp 16  Ht 6' 3.5" (1.918 m)  Wt 252 lb (114.306 kg)  BMI 31.07 kg/m2 Wt Readings from Last 3 Encounters:  01/07/15 252 lb (114.306 kg)  09/11/14 252 lb 9.6 oz (114.579 kg)  06/08/14 251 lb (113.853 kg)   General Appearance: Well nourished, in no apparent distress. Eyes: PERRLA, EOMs, conjunctiva no swelling or erythema Sinuses: No Frontal/maxillary tenderness ENT/Mouth: Ext aud canals clear, TMs without erythema, bulging. No erythema, swelling, or exudate on post pharynx.  Tonsils not swollen or erythematous. Hearing normal.  Neck: Supple, thyroid normal.  Respiratory: Respiratory effort normal, BS equal bilaterally without rales, rhonchi, wheezing or stridor.  Cardio: RRR with no MRGs. Brisk peripheral pulses without edema.  Abdomen: Soft, + BS,  Non tender, no guarding, rebound, hernias, masses. Lymphatics: Non tender without lymphadenopathy.  Musculoskeletal: Full ROM, 5/5 strength, Normal gait Skin: Warm, dry without rashes, lesions, ecchymosis.  Neuro: Cranial nerves intact. Normal muscle tone, no cerebellar symptoms. Psych: Awake and oriented X 3, normal affect, Insight and Judgment appropriate.    Vicie Mutters, PA-C 8:48 AM [facility]

## 2015-01-07 NOTE — Patient Instructions (Signed)
Bad carbs also include fruit juice, alcohol, and sweet tea. These are empty calories that do not signal to your brain that you are full.   Please remember the good carbs are still carbs which convert into sugar. So please measure them out no more than 1/2-1 cup of rice, oatmeal, pasta, and beans  Veggies are however free foods! Pile them on.   Not all fruit is created equal. Please see the list below, the fruit at the bottom is higher in sugars than the fruit at the top. Please avoid all dried fruits.    We want weight loss that will last so you should lose 1-2 pounds a week.  THAT IS IT! Please pick THREE things a month to change. Once it is a habit check off the item. Then pick another three items off the list to become habits.  If you are already doing a habit on the list GREAT!  Cross that item off! o Don't drink your calories. Ie, alcohol, soda, fruit juice, and sweet tea.  o Drink more water. Drink a glass when you feel hungry or before each meal.  o Eat breakfast - Complex carb and protein (likeDannon light and fit yogurt, oatmeal, fruit, eggs, [place] bacon). o Measure your cereal.  Eat no more than one cup a day. (ie [place]) o Eat an apple a day. o Add a vegetable a day. o Try a new vegetable a month. o Use Pam! Stop using oil or butter to cook. o Don't finish your plate or use smaller plates. o Share your dessert. o Eat sugar free Jello for dessert or frozen grapes. o Don't eat 2-3 hours before bed. o Switch to whole wheat bread, pasta, and brown rice. o Make healthier choices when you eat out. No fries! o Pick baked chicken, NOT fried. o Don't forget to SLOW DOWN when you eat. It is not going anywhere.  o Take the stairs. o Park far away in the parking lot o [employer] (or weights) for 10 minutes while watching TV. o Walk at work for 10 minutes during break. o Walk outside 1 time a week with your friend, kids, dog, or significant other. o Start a walking group at  [place] the mall as much as you can tolerate.  o Keep a food diary. o Weigh yourself daily. o Walk for 15 minutes 3 days per week. o Cook at home more often and eat out less.  If life happens and you go back to old habits, it is okay.  Just start over. You can do it!   If you experience chest pain, get short of breath, or tired during the exercise, please stop immediately and inform your doctor.   Recommendations For Diabetic/Prediabetic Patients:   -  Take medications as prescribed  -  Recommend Dr Fara Olden Fuhrman's book "The End of Diabetes "  And "The End of Dieting"- Can get at  [email or web address] and encourage also get the Audio CD book  - AVOID Animal products, ie. Meat - red/white, Poultry and Dairy/especially cheese - Exercise at least 5 times a week for 30 minutes or preferably daily.  - No Smoking - Drink less than 2 drinks a day.  - Monitor your feet for sores - Have yearly Eye Exams - Recommend annual Flu vaccine  - Recommend Pneumovax and Prevnar vaccines - Shingles Vaccine (Zostavax) if over 80 y.o.  Goals:   - BMI less than 24 - Fasting sugar less than  130 or less than 150 if tapering medicines to lose weight  - Systolic BP less than 116  - Diastolic BP less than 80 - Bad LDL Cholesterol less than 70 - Triglycerides less than 150

## 2015-01-08 LAB — BASIC METABOLIC PANEL WITH GFR
BUN: 13 mg/dL (ref 6–23)
CALCIUM: 9.2 mg/dL (ref 8.4–10.5)
CHLORIDE: 104 meq/L (ref 96–112)
CO2: 29 meq/L (ref 19–32)
Creat: 1.03 mg/dL (ref 0.50–1.35)
GFR, Est African American: 83 mL/min
GFR, Est Non African American: 72 mL/min
Glucose, Bld: 101 mg/dL — ABNORMAL HIGH (ref 70–99)
Potassium: 4.7 mEq/L (ref 3.5–5.3)
SODIUM: 141 meq/L (ref 135–145)

## 2015-01-08 LAB — HEMOGLOBIN A1C
Hgb A1c MFr Bld: 5.7 % — ABNORMAL HIGH (ref <5.7)
Mean Plasma Glucose: 117 mg/dL — ABNORMAL HIGH (ref <117)

## 2015-01-08 LAB — LIPID PANEL
CHOL/HDL RATIO: 3.2 ratio
Cholesterol: 162 mg/dL (ref 0–200)
HDL: 50 mg/dL (ref >=40)
LDL CALC: 91 mg/dL (ref 0–99)
Triglycerides: 103 mg/dL (ref <150)
VLDL: 21 mg/dL (ref 0–40)

## 2015-01-08 LAB — VITAMIN D 25 HYDROXY (VIT D DEFICIENCY, FRACTURES): Vit D, 25-Hydroxy: 50 ng/mL (ref 30–100)

## 2015-01-08 LAB — HEPATIC FUNCTION PANEL
ALT: 21 U/L (ref 0–53)
AST: 21 U/L (ref 0–37)
Albumin: 4.2 g/dL (ref 3.5–5.2)
Alkaline Phosphatase: 54 U/L (ref 39–117)
BILIRUBIN INDIRECT: 0.8 mg/dL (ref 0.2–1.2)
BILIRUBIN TOTAL: 1 mg/dL (ref 0.2–1.2)
Bilirubin, Direct: 0.2 mg/dL (ref 0.0–0.3)
Total Protein: 6.8 g/dL (ref 6.0–8.3)

## 2015-01-08 LAB — INSULIN, FASTING: Insulin fasting, serum: 11.6 u[IU]/mL (ref 2.0–19.6)

## 2015-01-08 LAB — TSH: TSH: 2.466 u[IU]/mL (ref 0.350–4.500)

## 2015-01-08 LAB — MAGNESIUM: Magnesium: 2 mg/dL (ref 1.5–2.5)

## 2015-04-11 ENCOUNTER — Encounter: Payer: Self-pay | Admitting: Internal Medicine

## 2015-04-11 NOTE — Patient Instructions (Signed)

## 2015-04-11 NOTE — Progress Notes (Signed)
Patient ID: Cody Hamilton, male   DOB: 1940/12/11, 74 y.o.   MRN: [id number]   This very nice 74 y.o. MWM presents for 6 month follow up with Hypertension, Hyperlipidemia, Pre-Diabetes and Vitamin D Deficiency.    Patient is treated for HTN  Since 1990 & BP has been controlled at home. Today's BP: 122/76 mmHg. Patient has had no complaints of any cardiac type chest pain, palpitations, dyspnea/orthopnea/PND, dizziness, claudication, or dependent edema.   Hyperlipidemia is controlled with diet & meds. Patient denies myalgias or other med SE's. Last Lipids were at goal - Cholesterol 162; HDL 50; LDL 91; Triglycerides 103 on 01/07/2015.   Also, the patient has history of  PreDiabetes with A1c 5.7% in 2011 & 5.8% in 2012 and has had no symptoms of reactive hypoglycemia, diabetic polys, paresthesias or visual blurring.  Last A1c was still 5.7% on 01/07/2015.    Further, the patient also has history of Vitamin D Deficiency of 36 in 2008 and supplements vitamin D without any suspected side-effects. Last vitamin D was 50 on 01/07/2015.     Medication Sig  . ALPRAZolam (XANAX) 1 MG tablet TAKE 1/2 TO 1 TAB THREE TIMES DAILY   . aspirin 81 MG EC tablet Take  daily.    Marland Kitchen VITAMIN D 1,000 UNITS  Take 10,000 Units  every other day.   . Cinnamon 500 MG capsule Take 500 mg  4  times daily.  . enalapril  20 MG tablet TAKE 1 TAB  EVERY DAY   . hctz 12.5 MG tablet TAKE 1 TAB  EVERY MORNING  . latanoprost (XALATAN) 0.005 % ophth soln Place 1 drop into both eyes at bedtime.  Marland Kitchen MAG-OX 400 MG tablet Take  2  times daily.   . Multiple Vitamin  Take 1 tab daily.    Marland Kitchen FISH OIL 1200 MG CAPS Take 1 cap 2  times daily.   . pravastatin  40 MG tablet TAKE 1 TAB EVERY NIGHT  . vitamin C (500 MG tablet Take  daily.    . vitamin E 400 UNIT capsule Take  daily.     Allergies  Allergen Reactions  . Viagra [Sildenafil Citrate]     Headache   PMHx:   Past Medical History  Diagnosis Date  . Colon polyps   . Hypertension   .  Elevated hemoglobin A1c   . Vitamin D deficiency   . BPH (benign prostatic hypertrophy)   . Glaucoma    Immunization History  Administered Date(s) Administered  . Influenza-Unspecified 04/30/2014  . Pneumococcal Conjugate-13 06/08/2014  . Pneumococcal Polysaccharide-23 08/09/2009  . Td 08/09/2009   Past Surgical History  Procedure Laterality Date  . Inguinal hernia repair    . Laparoscopic appendectomy    . Cardiac catheterization  1999   FHx:    Reviewed / unchanged  SHx:    Reviewed / unchanged  Systems Review:  Constitutional: Denies fever, chills, wt changes, headaches, insomnia, fatigue, night sweats, change in appetite. Eyes: Denies redness, blurred vision, diplopia, discharge, itchy, watery eyes.  ENT: Denies discharge, congestion, post nasal drip, epistaxis, sore throat, earache, hearing loss, dental pain, tinnitus, vertigo, sinus pain, snoring.  CV: Denies chest pain, palpitations, irregular heartbeat, syncope, dyspnea, diaphoresis, orthopnea, PND, claudication or edema. Respiratory: denies cough, dyspnea, DOE, pleurisy, hoarseness, laryngitis, wheezing.  Gastrointestinal: Denies dysphagia, odynophagia, heartburn, reflux, water brash, abdominal pain or cramps, nausea, vomiting, bloating, diarrhea, constipation, hematemesis, melena, hematochezia  or hemorrhoids. Genitourinary: Denies dysuria, frequency, urgency, nocturia, hesitancy, discharge,  hematuria or flank pain. Musculoskeletal: Denies arthralgias, myalgias, stiffness, jt. swelling, pain, limping or strain/sprain.  Skin: Denies pruritus, rash, hives, warts, acne, eczema or change in skin lesion(s). Neuro: No weakness, tremor, incoordination, spasms, paresthesia or pain. Psychiatric: Denies confusion, memory loss or sensory loss. Endo: Denies change in weight, skin or hair change.  Heme/Lymph: No excessive bleeding, bruising or enlarged lymph nodes.  Physical Exam  BP 122/76   Pulse 64  Temp 97.5 F   Resp 16   Ht 6' 3.5"   Wt 253 lb 6.4 oz     BMI 31.24  Appears well nourished and in no distress. Eyes: PERRLA, EOMs, conjunctiva no swelling or erythema. Sinuses: No frontal/maxillary tenderness ENT/Mouth: EAC's clear, TM's nl w/o erythema, bulging. Nares clear w/o erythema, swelling, exudates. Oropharynx clear without erythema or exudates. Oral hygiene is good. Tongue normal, non obstructing. Hearing intact.  Neck: Supple. Thyroid nl. Car 2+/2+ without bruits, nodes or JVD. Chest: Respirations nl with BS clear & equal w/o rales, rhonchi, wheezing or stridor.  Cor: Heart sounds normal w/ regular rate and rhythm without sig. murmurs, gallops, clicks, or rubs. Peripheral pulses normal and equal  without edema.  Abdomen: Soft & bowel sounds normal. Non-tender w/o guarding, rebound, hernias, masses, or organomegaly.  Lymphatics: Unremarkable.  Musculoskeletal: Full ROM all peripheral extremities, joint stability, 5/5 strength, and normal gait.  Skin: Warm, dry without exposed rashes, lesions or ecchymosis apparent.  Neuro: Cranial nerves intact, reflexes equal bilaterally. Sensory-motor testing grossly intact. Tendon reflexes grossly intact.  Pysch: Alert & oriented x 3.  Insight and judgement nl & appropriate. No ideations.  Assessment and Plan:  1. Essential hypertension  - TSH  2. Hyperlipidemia  - Lipid panel  3. Prediabetes  - Hemoglobin A1c - Insulin, random  4. Vitamin D deficiency  - Vit D  25 hydroxy   5. Viral disease exposure  - Varicella zoster antibody, IgG  6. BMI 31.0-31.9,adult   7. Medication management  - CBC with Differential/Platelet - BASIC METABOLIC PANEL WITH GFR - Hepatic function panel - Magnesium   Recommended regular exercise, BP monitoring, weight control, and discussed med and SE's. Recommended labs to assess and monitor clinical status. Further disposition pending results of labs. Over 30 minutes of exam, counseling, chart review was performed

## 2015-04-12 ENCOUNTER — Ambulatory Visit (INDEPENDENT_AMBULATORY_CARE_PROVIDER_SITE_OTHER): Payer: Medicare Other | Admitting: Internal Medicine

## 2015-04-12 ENCOUNTER — Encounter: Payer: Self-pay | Admitting: Internal Medicine

## 2015-04-12 VITALS — BP 122/76 | HR 64 | Temp 97.5°F | Resp 16 | Ht 75.5 in | Wt 253.4 lb

## 2015-04-12 DIAGNOSIS — I10 Essential (primary) hypertension: Secondary | ICD-10-CM

## 2015-04-12 DIAGNOSIS — Z6831 Body mass index (BMI) 31.0-31.9, adult: Secondary | ICD-10-CM | POA: Diagnosis not present

## 2015-04-12 DIAGNOSIS — Z79899 Other long term (current) drug therapy: Secondary | ICD-10-CM | POA: Diagnosis not present

## 2015-04-12 DIAGNOSIS — R7309 Other abnormal glucose: Secondary | ICD-10-CM

## 2015-04-12 DIAGNOSIS — E782 Mixed hyperlipidemia: Secondary | ICD-10-CM | POA: Diagnosis not present

## 2015-04-12 DIAGNOSIS — Z20828 Contact with and (suspected) exposure to other viral communicable diseases: Secondary | ICD-10-CM

## 2015-04-12 DIAGNOSIS — E559 Vitamin D deficiency, unspecified: Secondary | ICD-10-CM

## 2015-04-12 DIAGNOSIS — Z0000 Encounter for general adult medical examination without abnormal findings: Secondary | ICD-10-CM

## 2015-04-12 LAB — CBC WITH DIFFERENTIAL/PLATELET
Basophils Absolute: 0 10*3/uL (ref 0.0–0.1)
Basophils Relative: 0 % (ref 0–1)
Eosinophils Absolute: 0.1 10*3/uL (ref 0.0–0.7)
Eosinophils Relative: 1 % (ref 0–5)
HEMATOCRIT: 46.7 % (ref 39.0–52.0)
HEMOGLOBIN: 16 g/dL (ref 13.0–17.0)
LYMPHS PCT: 33 % (ref 12–46)
Lymphs Abs: 1.9 10*3/uL (ref 0.7–4.0)
MCH: 32.9 pg (ref 26.0–34.0)
MCHC: 34.3 g/dL (ref 30.0–36.0)
MCV: 96.1 fL (ref 78.0–100.0)
MONO ABS: 0.4 10*3/uL (ref 0.1–1.0)
MPV: 10.3 fL (ref 8.6–12.4)
Monocytes Relative: 7 % (ref 3–12)
NEUTROS ABS: 3.5 10*3/uL (ref 1.7–7.7)
NEUTROS PCT: 59 % (ref 43–77)
Platelets: 164 10*3/uL (ref 150–400)
RBC: 4.86 MIL/uL (ref 4.22–5.81)
RDW: 13.5 % (ref 11.5–15.5)
WBC: 5.9 10*3/uL (ref 4.0–10.5)

## 2015-04-12 LAB — BASIC METABOLIC PANEL WITH GFR
BUN: 18 mg/dL (ref 7–25)
CALCIUM: 9.4 mg/dL (ref 8.6–10.3)
CHLORIDE: 103 mmol/L (ref 98–110)
CO2: 28 mmol/L (ref 20–31)
Creat: 1.03 mg/dL (ref 0.70–1.18)
GFR, EST NON AFRICAN AMERICAN: 72 mL/min (ref >=60)
GFR, Est African American: 83 mL/min (ref >=60)
GLUCOSE: 103 mg/dL — AB (ref 65–99)
Potassium: 4.3 mmol/L (ref 3.5–5.3)
Sodium: 139 mmol/L (ref 135–146)

## 2015-04-12 LAB — HEPATIC FUNCTION PANEL
ALK PHOS: 57 U/L (ref 40–115)
ALT: 30 U/L (ref 9–46)
AST: 25 U/L (ref 10–35)
Albumin: 4.4 g/dL (ref 3.6–5.1)
BILIRUBIN DIRECT: 0.2 mg/dL (ref <=0.2)
BILIRUBIN INDIRECT: 0.9 mg/dL (ref 0.2–1.2)
TOTAL PROTEIN: 6.8 g/dL (ref 6.1–8.1)
Total Bilirubin: 1.1 mg/dL (ref 0.2–1.2)

## 2015-04-12 LAB — LIPID PANEL
CHOL/HDL RATIO: 3.9 ratio (ref <=5.0)
Cholesterol: 178 mg/dL (ref 125–200)
HDL: 46 mg/dL (ref >=40)
LDL CALC: 106 mg/dL (ref <130)
Triglycerides: 132 mg/dL (ref <150)
VLDL: 26 mg/dL (ref <30)

## 2015-04-12 LAB — TSH: TSH: 2.288 u[IU]/mL (ref 0.350–4.500)

## 2015-04-12 LAB — MAGNESIUM: Magnesium: 2.1 mg/dL (ref 1.5–2.5)

## 2015-04-13 ENCOUNTER — Other Ambulatory Visit: Payer: Self-pay | Admitting: Physician Assistant

## 2015-04-13 LAB — VARICELLA ZOSTER ANTIBODY, IGG: VARICELLA IGG: 887.8 {index} — AB (ref <135.00)

## 2015-04-13 LAB — HEMOGLOBIN A1C
Hgb A1c MFr Bld: 5.7 % — ABNORMAL HIGH (ref <5.7)
Mean Plasma Glucose: 117 mg/dL — ABNORMAL HIGH (ref <117)

## 2015-04-13 LAB — VITAMIN D 25 HYDROXY (VIT D DEFICIENCY, FRACTURES): Vit D, 25-Hydroxy: 55 ng/mL (ref 30–100)

## 2015-04-13 LAB — INSULIN, RANDOM: Insulin: 16.3 u[IU]/mL (ref 2.0–19.6)

## 2015-04-18 ENCOUNTER — Other Ambulatory Visit: Payer: Self-pay | Admitting: Internal Medicine

## 2015-06-11 ENCOUNTER — Other Ambulatory Visit: Payer: Self-pay | Admitting: Internal Medicine

## 2015-07-20 ENCOUNTER — Ambulatory Visit: Payer: Self-pay | Admitting: Internal Medicine

## 2015-07-20 DIAGNOSIS — H402222 Chronic angle-closure glaucoma, left eye, moderate stage: Secondary | ICD-10-CM | POA: Diagnosis not present

## 2015-07-20 DIAGNOSIS — H402211 Chronic angle-closure glaucoma, right eye, mild stage: Secondary | ICD-10-CM | POA: Diagnosis not present

## 2015-07-21 ENCOUNTER — Encounter: Payer: Self-pay | Admitting: Internal Medicine

## 2015-07-21 ENCOUNTER — Ambulatory Visit (INDEPENDENT_AMBULATORY_CARE_PROVIDER_SITE_OTHER): Payer: Medicare Other | Admitting: Internal Medicine

## 2015-07-21 VITALS — BP 122/70 | HR 80 | Temp 98.0°F | Resp 18 | Ht 75.5 in | Wt 250.0 lb

## 2015-07-21 DIAGNOSIS — R7309 Other abnormal glucose: Secondary | ICD-10-CM | POA: Diagnosis not present

## 2015-07-21 DIAGNOSIS — Z79899 Other long term (current) drug therapy: Secondary | ICD-10-CM

## 2015-07-21 DIAGNOSIS — I10 Essential (primary) hypertension: Secondary | ICD-10-CM | POA: Diagnosis not present

## 2015-07-21 DIAGNOSIS — E782 Mixed hyperlipidemia: Secondary | ICD-10-CM

## 2015-07-21 DIAGNOSIS — E559 Vitamin D deficiency, unspecified: Secondary | ICD-10-CM

## 2015-07-21 LAB — CBC WITH DIFFERENTIAL/PLATELET
BASOS ABS: 0 10*3/uL (ref 0.0–0.1)
BASOS PCT: 0 % (ref 0–1)
EOS ABS: 0.1 10*3/uL (ref 0.0–0.7)
EOS PCT: 2 % (ref 0–5)
HCT: 46 % (ref 39.0–52.0)
Hemoglobin: 15.9 g/dL (ref 13.0–17.0)
Lymphocytes Relative: 30 % (ref 12–46)
Lymphs Abs: 2.2 10*3/uL (ref 0.7–4.0)
MCH: 32.8 pg (ref 26.0–34.0)
MCHC: 34.6 g/dL (ref 30.0–36.0)
MCV: 94.8 fL (ref 78.0–100.0)
MONO ABS: 0.6 10*3/uL (ref 0.1–1.0)
MONOS PCT: 8 % (ref 3–12)
MPV: 10.4 fL (ref 8.6–12.4)
Neutro Abs: 4.3 10*3/uL (ref 1.7–7.7)
Neutrophils Relative %: 60 % (ref 43–77)
PLATELETS: 180 10*3/uL (ref 150–400)
RBC: 4.85 MIL/uL (ref 4.22–5.81)
RDW: 13.4 % (ref 11.5–15.5)
WBC: 7.2 10*3/uL (ref 4.0–10.5)

## 2015-07-21 LAB — HEPATIC FUNCTION PANEL
ALBUMIN: 4.5 g/dL (ref 3.6–5.1)
ALK PHOS: 56 U/L (ref 40–115)
ALT: 27 U/L (ref 9–46)
AST: 25 U/L (ref 10–35)
BILIRUBIN TOTAL: 1.2 mg/dL (ref 0.2–1.2)
Bilirubin, Direct: 0.2 mg/dL (ref <=0.2)
Indirect Bilirubin: 1 mg/dL (ref 0.2–1.2)
TOTAL PROTEIN: 7.2 g/dL (ref 6.1–8.1)

## 2015-07-21 LAB — BASIC METABOLIC PANEL WITH GFR
BUN: 14 mg/dL (ref 7–25)
CALCIUM: 9.3 mg/dL (ref 8.6–10.3)
CO2: 26 mmol/L (ref 20–31)
CREATININE: 0.97 mg/dL (ref 0.70–1.18)
Chloride: 99 mmol/L (ref 98–110)
GFR, EST AFRICAN AMERICAN: 89 mL/min (ref >=60)
GFR, Est Non African American: 77 mL/min (ref >=60)
GLUCOSE: 104 mg/dL — AB (ref 65–99)
POTASSIUM: 4 mmol/L (ref 3.5–5.3)
Sodium: 135 mmol/L (ref 135–146)

## 2015-07-21 LAB — LIPID PANEL
Cholesterol: 168 mg/dL (ref 125–200)
HDL: 45 mg/dL (ref >=40)
LDL CALC: 97 mg/dL (ref <130)
Total CHOL/HDL Ratio: 3.7 Ratio (ref <=5.0)
Triglycerides: 129 mg/dL (ref <150)
VLDL: 26 mg/dL (ref <30)

## 2015-07-21 LAB — TSH: TSH: 2.866 u[IU]/mL (ref 0.350–4.500)

## 2015-07-21 NOTE — Progress Notes (Signed)
Patient ID: Cody Hamilton, male   DOB: [birth year], 74 y.o.   MRN: [id number]  Assessment and Plan:  Hypertension:  -Continue medication,  -monitor blood pressure at home.  -Continue DASH diet.   -Reminder to go to the ER if any CP, SOB, nausea, dizziness, severe HA, changes vision/speech, left arm numbness and tingling, and jaw pain.  Cholesterol: -having muscle spasms -cut back to T,Th,Sat schedule -may need trial off medication -Continue diet and exercise.  -Check cholesterol.   Pre-diabetes: -Continue diet and exercise.  -Check A1C  Vitamin D Def: -check level -continue medications.     Continue diet and meds as discussed. Further disposition pending results of labs.  HPI 74 y.o. male  presents for 3 month follow up with hypertension, hyperlipidemia, prediabetes and vitamin D.   His blood pressure has been controlled at home, today their BP is BP: 122/70 mmHg.   He does workout. He denies chest pain, shortness of breath, dizziness.   He is on cholesterol medication and denies myalgias. His cholesterol is at goal. The cholesterol last visit was:   Lab Results  Component Value Date   CHOL 178 04/12/2015   HDL 46 04/12/2015   LDLCALC 106 04/12/2015   TRIG 132 04/12/2015   CHOLHDL 3.9 04/12/2015     He has been working on diet and exercise for prediabetes, and denies foot ulcerations, hyperglycemia, hypoglycemia , increased appetite, nausea, paresthesia of the feet, polydipsia, polyuria, visual disturbances, vomiting and weight loss. Last A1C in the office was:  Lab Results  Component Value Date   HGBA1C 5.7* 04/12/2015    Patient is on Vitamin D supplement.  Lab Results  Component Value Date   VD25OH 47 04/12/2015     He reports that he is having some cramping in his calves and also in his feet.  He reports that he has been drinking plenty of water.  He reports that when he takes white vinegar that does tend to help.  He is taking a magnesium supplement  currently.  He does sometimes drink gatorade as well.    Current Medications:  Current Outpatient Prescriptions on File Prior to Visit  Medication Sig Dispense Refill  . ALPRAZolam (XANAX) 1 MG tablet TAKE 1/2 TO 1 TABLET BY MOUTH THREE TIMES DAILY AS NEEDED 90 tablet 0  . aspirin 81 MG EC tablet Take 81 mg by mouth daily.      . Cholecalciferol (VITAMIN D3) 10000 UNITS capsule Take 10,000 Units by mouth every other day.     . Cinnamon 500 MG capsule Take 500 mg by mouth 4 (four) times daily.    . enalapril (VASOTEC) 20 MG tablet TAKE 1 TABLET BY MOUTH EVERY DAY FOR BLOOD PRESSURE 90 tablet 3  . hydrochlorothiazide (HYDRODIURIL) 12.5 MG tablet TAKE 1 TABLET BY MOUTH EVERY MORNING FOR BLOOD PRESSURE 90 tablet 3  . latanoprost (XALATAN) 0.005 % ophthalmic solution Place 1 drop into both eyes at bedtime.    . magnesium oxide (MAG-OX) 400 MG tablet Take 400 mg by mouth 2 (two) times daily.     . Multiple Vitamin (MULTIVITAMIN) tablet Take 1 tablet by mouth daily.      . Omega-3 Fatty Acids (FISH OIL) 1200 MG CAPS Take 1 capsule by mouth 2 (two) times daily.     . pravastatin (PRAVACHOL) 40 MG tablet TAKE 1 TABLET BY MOUTH EVERY NIGHT AT BEDTIME FOR CHOLESTEROL 90 tablet PRN  . vitamin C (ASCORBIC ACID) 500 MG tablet Take 500 mg by  mouth daily.      . vitamin E 400 UNIT capsule Take 400 Units by mouth daily.       No current facility-administered medications on file prior to visit.    Medical History:  Past Medical History  Diagnosis Date  . Colon polyps   . Hypertension   . Elevated hemoglobin A1c   . Vitamin D deficiency   . BPH (benign prostatic hypertrophy)   . Glaucoma     Allergies:  Allergies  Allergen Reactions  . Viagra [Sildenafil Citrate]     Headache     Review of Systems:  Review of Systems  Constitutional: Negative for fever, chills and malaise/fatigue.  HENT: Negative for congestion, ear pain and sore throat.   Eyes: Negative.   Respiratory: Negative for cough,  shortness of breath and wheezing.   Cardiovascular: Negative for chest pain, palpitations and leg swelling.  Gastrointestinal: Negative for heartburn, abdominal pain, diarrhea, constipation, blood in stool and melena.  Genitourinary: Negative.   Skin: Negative.   Neurological: Negative for dizziness, sensory change, loss of consciousness and headaches.  Psychiatric/Behavioral: Negative for depression. The patient is not nervous/anxious and does not have insomnia.     Family history- Review and unchanged  Social history- Review and unchanged  Physical Exam: BP 122/70 mmHg  Pulse 80  Temp(Src) 98 F (36.7 C) (Temporal)  Resp 18  Ht 6' 3.5" (1.918 m)  Wt 250 lb (113.399 kg)  BMI 30.83 kg/m2 Wt Readings from Last 3 Encounters:  07/21/15 250 lb (113.399 kg)  04/12/15 253 lb 6.4 oz (114.941 kg)  01/07/15 252 lb (114.306 kg)    General Appearance: Well nourished well developed, in no apparent distress. Eyes: PERRLA, EOMs, conjunctiva no swelling or erythema ENT/Mouth: Ear canals normal without obstruction, swelling, erythma, discharge.  TMs normal bilaterally.  Oropharynx moist, clear, without exudate, or postoropharyngeal swelling. Neck: Supple, thyroid normal,no cervical adenopathy  Respiratory: Respiratory effort normal, Breath sounds clear A&P without rhonchi, wheeze, or rale.  No retractions, no accessory usage. Cardio: RRR with no MRGs. Brisk peripheral pulses without edema.  Abdomen: Soft, + BS,  Non tender, no guarding, rebound, hernias, masses. Musculoskeletal: Full ROM, 5/5 strength, Normal gait Skin: Warm, dry without rashes, lesions, ecchymosis.  Neuro: Awake and oriented X 3, Cranial nerves intact. Normal muscle tone, no cerebellar symptoms. Psych: Normal affect, Insight and Judgment appropriate.    Starlyn Skeans, PA-C 11:04 AM [facility]

## 2015-07-22 LAB — HEMOGLOBIN A1C
HEMOGLOBIN A1C: 5.7 % — AB (ref <5.7)
MEAN PLASMA GLUCOSE: 117 mg/dL — AB (ref <117)

## 2015-08-08 ENCOUNTER — Other Ambulatory Visit: Payer: Self-pay | Admitting: Internal Medicine

## 2015-08-28 ENCOUNTER — Other Ambulatory Visit: Payer: Self-pay | Admitting: Internal Medicine

## 2015-09-21 DIAGNOSIS — H402222 Chronic angle-closure glaucoma, left eye, moderate stage: Secondary | ICD-10-CM | POA: Diagnosis not present

## 2015-09-21 DIAGNOSIS — H2513 Age-related nuclear cataract, bilateral: Secondary | ICD-10-CM | POA: Diagnosis not present

## 2015-09-21 DIAGNOSIS — H402211 Chronic angle-closure glaucoma, right eye, mild stage: Secondary | ICD-10-CM | POA: Diagnosis not present

## 2015-09-21 DIAGNOSIS — H04123 Dry eye syndrome of bilateral lacrimal glands: Secondary | ICD-10-CM | POA: Diagnosis not present

## 2015-09-28 ENCOUNTER — Encounter: Payer: Self-pay | Admitting: Internal Medicine

## 2015-10-19 ENCOUNTER — Encounter: Payer: Self-pay | Admitting: Internal Medicine

## 2015-10-19 ENCOUNTER — Ambulatory Visit (INDEPENDENT_AMBULATORY_CARE_PROVIDER_SITE_OTHER): Payer: Medicare Other | Admitting: Internal Medicine

## 2015-10-19 VITALS — BP 132/78 | HR 68 | Temp 97.3°F | Resp 16 | Ht 75.5 in | Wt 256.0 lb

## 2015-10-19 DIAGNOSIS — N400 Enlarged prostate without lower urinary tract symptoms: Secondary | ICD-10-CM | POA: Diagnosis not present

## 2015-10-19 DIAGNOSIS — E559 Vitamin D deficiency, unspecified: Secondary | ICD-10-CM | POA: Diagnosis not present

## 2015-10-19 DIAGNOSIS — M5136 Other intervertebral disc degeneration, lumbar region: Secondary | ICD-10-CM | POA: Diagnosis not present

## 2015-10-19 DIAGNOSIS — R6889 Other general symptoms and signs: Secondary | ICD-10-CM | POA: Diagnosis not present

## 2015-10-19 DIAGNOSIS — Z0001 Encounter for general adult medical examination with abnormal findings: Secondary | ICD-10-CM

## 2015-10-19 DIAGNOSIS — Z125 Encounter for screening for malignant neoplasm of prostate: Secondary | ICD-10-CM | POA: Diagnosis not present

## 2015-10-19 DIAGNOSIS — I10 Essential (primary) hypertension: Secondary | ICD-10-CM

## 2015-10-19 DIAGNOSIS — Z79899 Other long term (current) drug therapy: Secondary | ICD-10-CM

## 2015-10-19 DIAGNOSIS — Z0000 Encounter for general adult medical examination without abnormal findings: Secondary | ICD-10-CM | POA: Diagnosis not present

## 2015-10-19 DIAGNOSIS — E782 Mixed hyperlipidemia: Secondary | ICD-10-CM

## 2015-10-19 DIAGNOSIS — R7309 Other abnormal glucose: Secondary | ICD-10-CM | POA: Diagnosis not present

## 2015-10-19 DIAGNOSIS — Z1212 Encounter for screening for malignant neoplasm of rectum: Secondary | ICD-10-CM

## 2015-10-19 DIAGNOSIS — M51369 Other intervertebral disc degeneration, lumbar region without mention of lumbar back pain or lower extremity pain: Secondary | ICD-10-CM

## 2015-10-19 LAB — HEPATIC FUNCTION PANEL
ALBUMIN: 4.2 g/dL (ref 3.6–5.1)
ALT: 25 U/L (ref 9–46)
AST: 22 U/L (ref 10–35)
Alkaline Phosphatase: 60 U/L (ref 40–115)
BILIRUBIN TOTAL: 0.7 mg/dL (ref 0.2–1.2)
Bilirubin, Direct: 0.1 mg/dL (ref <=0.2)
Indirect Bilirubin: 0.6 mg/dL (ref 0.2–1.2)
TOTAL PROTEIN: 6.8 g/dL (ref 6.1–8.1)

## 2015-10-19 LAB — LIPID PANEL
CHOLESTEROL: 177 mg/dL (ref 125–200)
HDL: 39 mg/dL — ABNORMAL LOW (ref >=40)
LDL Cholesterol: 72 mg/dL (ref <130)
Total CHOL/HDL Ratio: 4.5 Ratio (ref <=5.0)
Triglycerides: 332 mg/dL — ABNORMAL HIGH (ref <150)
VLDL: 66 mg/dL — ABNORMAL HIGH (ref <30)

## 2015-10-19 LAB — BASIC METABOLIC PANEL WITH GFR
BUN: 18 mg/dL (ref 7–25)
CALCIUM: 9.3 mg/dL (ref 8.6–10.3)
CHLORIDE: 102 mmol/L (ref 98–110)
CO2: 27 mmol/L (ref 20–31)
Creat: 1.02 mg/dL (ref 0.70–1.18)
GFR, EST AFRICAN AMERICAN: 84 mL/min (ref >=60)
GFR, EST NON AFRICAN AMERICAN: 73 mL/min (ref >=60)
GLUCOSE: 96 mg/dL (ref 65–99)
POTASSIUM: 4.3 mmol/L (ref 3.5–5.3)
SODIUM: 138 mmol/L (ref 135–146)

## 2015-10-19 LAB — CBC WITH DIFFERENTIAL/PLATELET
BASOS PCT: 0 % (ref 0–1)
Basophils Absolute: 0 10*3/uL (ref 0.0–0.1)
Eosinophils Absolute: 0.1 10*3/uL (ref 0.0–0.7)
Eosinophils Relative: 2 % (ref 0–5)
HEMATOCRIT: 46.4 % (ref 39.0–52.0)
HEMOGLOBIN: 16.1 g/dL (ref 13.0–17.0)
LYMPHS PCT: 31 % (ref 12–46)
Lymphs Abs: 2.3 10*3/uL (ref 0.7–4.0)
MCH: 33.3 pg (ref 26.0–34.0)
MCHC: 34.7 g/dL (ref 30.0–36.0)
MCV: 96.1 fL (ref 78.0–100.0)
MONO ABS: 0.7 10*3/uL (ref 0.1–1.0)
MONOS PCT: 9 % (ref 3–12)
MPV: 10 fL (ref 8.6–12.4)
NEUTROS ABS: 4.2 10*3/uL (ref 1.7–7.7)
NEUTROS PCT: 58 % (ref 43–77)
Platelets: 160 10*3/uL (ref 150–400)
RBC: 4.83 MIL/uL (ref 4.22–5.81)
RDW: 13.3 % (ref 11.5–15.5)
WBC: 7.3 10*3/uL (ref 4.0–10.5)

## 2015-10-19 LAB — MAGNESIUM: MAGNESIUM: 1.9 mg/dL (ref 1.5–2.5)

## 2015-10-19 LAB — TSH: TSH: 2.64 mIU/L (ref 0.40–4.50)

## 2015-10-19 NOTE — Progress Notes (Signed)
Patient ID: Cody Hamilton, male   DOB: [birth year], 75 y.o.   MRN: [id number]  Annual  Screening/Preventative Visit And Comprehensive Evaluation & Examination  This very nice 75 y.o. MWM presents for a Wellness/Preventative Visit & comprehensive evaluation and management of multiple medical co-morbidities.  Patient has been followed for HTN, Prediabetes, Hyperlipidemia and Vitamin D Deficiency.   HTN predates since 75. Patient's BP has been controlled at home.Today's BP: 132/78 mmHg. Patient denies any cardiac symptoms as chest pain, palpitations, shortness of breath, dizziness or ankle swelling.   Patient's hyperlipidemia is controlled with diet and medications. Patient denies myalgias or other medication SE's. Last lipids were at goal with Cholesterol 177; HDL 39*; LDL 72; but with elevated Triglycerides 332 on 10/19/2015.   Patient has prediabetes since 2011 with A1c 5.7%  and patient denies reactive hypoglycemic symptoms, visual blurring, diabetic polys or paresthesias. Last A1c was  5.7% on 07/21/2015.   Finally, patient has history of Vitamin D Deficiency of "68" in 2008 and last vitamin D was  55 on 04/12/2015.  Medication Sig  . ALPRAZolam 1 MG tablet TAKE 1/2 TO 1 TABLET BY MOUTH THREE TIMES DAILY AS NEEDED  . aspirin 81 MG EC  Take 81 mg by mouth daily.    Marland Kitchen VITAMIN D 82956 UNITS  Take 10,000 Units by mouth every other day.   . Cinnamon 500 MG capsule Take 500 mg by mouth 4 (four) times daily.  . enalapril 20 MG tablet TAKE 1 TABLET BY MOUTH EVERY DAY FOR BLOOD PRESSURE  . hctz 12.5 MG tablet TAKE 1 TABLET BY MOUTH EVERY MORNING FOR BLOOD PRESSURE.  Marland Kitchen XALATAN 0.005 % ophth soln Place 1 drop into both eyes at bedtime.  . magnesium  400 MG tablet Take 400 mg by mouth 2 (two) times daily.   . Multiple Vitamin  tablet Take 1 tablet by mouth daily.    . Omega-3 FISH OIL 1200 MG  Take 1 capsule by mouth 2 (two) times daily.   . pravastatin  40 MG tablet TAKE 1 TABLET BY MOUTH EVERY NIGHT  AT BEDTIME FOR CHOLESTEROL  . vitamin C  500 MG tablet Take 500 mg by mouth daily.    . vitamin E 400 UNIT capsule Take 400 Units by mouth daily.     Allergies  Allergen Reactions  . Viagra [Sildenafil Citrate]     Headache   Past Medical History  Diagnosis Date  . Colon polyps   . Hypertension   . Elevated hemoglobin A1c   . Vitamin D deficiency   . BPH (benign prostatic hypertrophy)   . Glaucoma    Health Maintenance  Topic Date Due  . INFLUENZA VACCINE  07/31/2017 (Originally 03/01/2015)  . ZOSTAVAX  08/01/2019 (Originally 01/05/2002)  . COLONOSCOPY  07/31/2017  . TETANUS/TDAP  08/10/2019  . PNA vac Low Risk Adult  Completed   Immunization History  Administered Date(s) Administered  . Influenza-Unspecified 04/30/2014  . Pneumococcal Conjugate-13 06/08/2014  . Pneumococcal Polysaccharide-23 08/09/2009  . Td 08/09/2009   Past Surgical History  Procedure Laterality Date  . Inguinal hernia repair    . Laparoscopic appendectomy    . Cardiac catheterization  1999   Family History  Problem Relation Age of Onset  . Hypertension Mother   . Alzheimer's disease Mother   . Hyperlipidemia Father   . Hypertension Father   . Cancer Father     esophagus  . Lymphoma Father   . Hypertension Sister   . Hyperlipidemia Sister  Social History   Social History  . Marital Status: Married    Spouse Name: N/A  . Number of Children: N/A  . Years of Education: N/A   Occupational History  . Retired Agricultural consultant   Social History Main Topics  . Smoking status: Former Smoker    Quit date: 07/31/2001  . Smokeless tobacco: Not on file  . Alcohol Use: No  . Drug Use: No  . Sexual Activity: Active    ROS Constitutional: Denies fever, chills, weight loss/gain, headaches, insomnia,  night sweats or change in appetite. Does c/o fatigue. Eyes: Denies redness, blurred vision, diplopia, discharge, itchy or watery eyes.  ENT: Denies discharge, congestion, post nasal drip, epistaxis, sore  throat, earache, hearing loss, dental pain, Tinnitus, Vertigo, Sinus pain or snoring.  Cardio: Denies chest pain, palpitations, irregular heartbeat, syncope, dyspnea, diaphoresis, orthopnea, PND, claudication or edema Respiratory: denies cough, dyspnea, DOE, pleurisy, hoarseness, laryngitis or wheezing.  Gastrointestinal: Denies dysphagia, heartburn, reflux, water brash, pain, cramps, nausea, vomiting, bloating, diarrhea, constipation, hematemesis, melena, hematochezia, jaundice or hemorrhoids Genitourinary: Denies dysuria, frequency, urgency, nocturia, hesitancy, discharge, hematuria or flank pain Musculoskeletal: Denies arthralgia, myalgia, stiffness, Jt. Swelling, pain, limp or strain/sprain. Denies Falls. Skin: Denies puritis, rash, hives, warts, acne, eczema or change in skin lesion Neuro: No weakness, tremor, incoordination, spasms, paresthesia or pain Psychiatric: Denies confusion, memory loss or sensory loss. Denies Depression. Endocrine: Denies change in weight, skin, hair change, nocturia, and paresthesia, diabetic polys, visual blurring or hyper / hypo glycemic episodes.  Heme/Lymph: No excessive bleeding, bruising or enlarged lymph nodes.  Physical Exam  BP 132/78 mmHg  Pulse 68  Temp(Src) 97.3 F (36.3 C)  Resp 16  Ht 6' 3.5" (1.918 m)  Wt 256 lb (116.121 kg)  BMI 31.57 kg/m2  General Appearance: Well nourished, in no apparent distress. Eyes: PERRLA, EOMs, conjunctiva no swelling or erythema, normal fundi and vessels. Sinuses: No frontal/maxillary tenderness ENT/Mouth: EACs patent / TMs  nl. Nares clear without erythema, swelling, mucoid exudates. Oral hygiene is good. No erythema, swelling, or exudate. Tongue normal, non-obstructing. Tonsils not swollen or erythematous. Hearing normal.  Neck: Supple, thyroid normal. No bruits, nodes or JVD. Respiratory: Respiratory effort normal.  BS equal and clear bilateral without rales, rhonci, wheezing or stridor. Cardio: Heart sounds  are normal with regular rate and rhythm and no murmurs, rubs or gallops. Peripheral pulses are normal and equal bilaterally without edema. No aortic or femoral bruits. Chest: symmetric with normal excursions and percussion.  Abdomen: Soft, with Nl bowel sounds. Nontender, no guarding, rebound, hernias, masses, or organomegaly.  Lymphatics: Non tender without lymphadenopathy.  Genitourinary: No hernias.Testes nl. DRE - prostate nl for age - smooth & firm w/o nodules. Musculoskeletal: Full ROM all peripheral extremities, joint stability, 5/5 strength, and normal gait. Skin: Warm and dry without rashes, lesions, cyanosis, clubbing or  ecchymosis.  Neuro: Cranial nerves intact, reflexes equal bilaterally. Normal muscle tone, no cerebellar symptoms. Sensation intact.  Pysch: Alert and oriented X 3 with normal affect, insight and judgment appropriate.   Assessment and Plan  1. Annual Preventative/Screening Exam   - Microalbumin / creatinine urine ratio - EKG 12-Lead - [place], RETROPERITNL ABD,  LTD - POC Hemoccult Bld/Stl  - Urinalysis, Routine w reflex microscopic  - PSA - CBC with Differential/Platelet - BASIC METABOLIC PANEL WITH GFR - Hepatic function panel - Magnesium - Lipid panel - TSH - Hemoglobin A1c - Insulin, random - VITAMIN D 25 Hydroxy   2. Essential hypertension  - Microalbumin /  creatinine urine ratio - EKG 12-Lead - [place], RETROPERITNL ABD,  LTD - TSH  3. Hyperlipidemia  - Lipid panel - TSH  4. Prediabetes  - Hemoglobin A1c - Insulin, random  5. Vitamin D deficiency  - VITAMIN D 25 Hydroxy   6. BPH (benign prostatic hypertrophy)   7. DDD (degenerative disc disease), lumbar   8. Morbid obesity ([place])   9. Screening for rectal cancer  - POC Hemoccult Bld/Stl  10. Prostate cancer screening  - PSA  11. Medication management  - Urinalysis, Routine w reflex microscopic  - CBC with Differential/Platelet - BASIC METABOLIC PANEL WITH GFR - Hepatic  function panel - Magnesium   Continue prudent diet as discussed, weight control, BP monitoring, regular exercise, and medications as discussed.  Discussed med effects and SE's. Routine screening labs and tests as requested with regular follow-up as recommended. Over 40 minutes of exam, counseling, chart review and high complex critical decision making was performed

## 2015-10-19 NOTE — Patient Instructions (Signed)

## 2015-10-20 LAB — MICROALBUMIN / CREATININE URINE RATIO
CREATININE, URINE: 236 mg/dL (ref 20–370)
Microalb Creat Ratio: 3 mcg/mg creat (ref <30)
Microalb, Ur: 0.7 mg/dL

## 2015-10-20 LAB — URINALYSIS, ROUTINE W REFLEX MICROSCOPIC
BILIRUBIN URINE: NEGATIVE
Glucose, UA: NEGATIVE
Hgb urine dipstick: NEGATIVE
KETONES UR: NEGATIVE
Leukocytes, UA: NEGATIVE
Nitrite: NEGATIVE
PH: 5.5 (ref 5.0–8.0)
Protein, ur: NEGATIVE
SPECIFIC GRAVITY, URINE: 1.027 (ref 1.001–1.035)

## 2015-10-20 LAB — HEMOGLOBIN A1C
Hgb A1c MFr Bld: 5.6 % (ref <5.7)
MEAN PLASMA GLUCOSE: 114 mg/dL (ref <117)

## 2015-10-20 LAB — PSA: PSA: 0.44 ng/mL (ref <=4.00)

## 2015-10-20 LAB — VITAMIN D 25 HYDROXY (VIT D DEFICIENCY, FRACTURES): VIT D 25 HYDROXY: 45 ng/mL (ref 30–100)

## 2015-10-20 LAB — INSULIN, RANDOM: INSULIN: 52.1 u[IU]/mL — AB (ref 2.0–19.6)

## 2015-11-30 ENCOUNTER — Other Ambulatory Visit: Payer: Self-pay | Admitting: *Deleted

## 2015-11-30 DIAGNOSIS — Z0001 Encounter for general adult medical examination with abnormal findings: Secondary | ICD-10-CM

## 2015-11-30 DIAGNOSIS — Z1212 Encounter for screening for malignant neoplasm of rectum: Secondary | ICD-10-CM

## 2015-11-30 LAB — POC HEMOCCULT BLD/STL (HOME/3-CARD/SCREEN)
FECAL OCCULT BLD: NEGATIVE
FECAL OCCULT BLD: NEGATIVE
FECAL OCCULT BLD: NEGATIVE

## 2016-01-19 DIAGNOSIS — H402222 Chronic angle-closure glaucoma, left eye, moderate stage: Secondary | ICD-10-CM | POA: Diagnosis not present

## 2016-01-19 DIAGNOSIS — H04123 Dry eye syndrome of bilateral lacrimal glands: Secondary | ICD-10-CM | POA: Diagnosis not present

## 2016-01-19 DIAGNOSIS — H402211 Chronic angle-closure glaucoma, right eye, mild stage: Secondary | ICD-10-CM | POA: Diagnosis not present

## 2016-01-31 ENCOUNTER — Ambulatory Visit: Payer: Self-pay | Admitting: Internal Medicine

## 2016-02-02 ENCOUNTER — Other Ambulatory Visit: Payer: Self-pay | Admitting: Internal Medicine

## 2016-02-06 ENCOUNTER — Other Ambulatory Visit: Payer: Self-pay | Admitting: Internal Medicine

## 2016-02-18 ENCOUNTER — Encounter: Payer: Self-pay | Admitting: Physician Assistant

## 2016-02-18 ENCOUNTER — Ambulatory Visit (INDEPENDENT_AMBULATORY_CARE_PROVIDER_SITE_OTHER): Payer: Medicare Other | Admitting: Physician Assistant

## 2016-02-18 ENCOUNTER — Ambulatory Visit: Payer: Self-pay | Admitting: Internal Medicine

## 2016-02-18 VITALS — BP 150/90 | HR 73 | Temp 97.9°F | Resp 16 | Ht 75.5 in | Wt 259.8 lb

## 2016-02-18 DIAGNOSIS — M51369 Other intervertebral disc degeneration, lumbar region without mention of lumbar back pain or lower extremity pain: Secondary | ICD-10-CM

## 2016-02-18 DIAGNOSIS — E559 Vitamin D deficiency, unspecified: Secondary | ICD-10-CM

## 2016-02-18 DIAGNOSIS — H409 Unspecified glaucoma: Secondary | ICD-10-CM

## 2016-02-18 DIAGNOSIS — H6123 Impacted cerumen, bilateral: Secondary | ICD-10-CM

## 2016-02-18 DIAGNOSIS — R7309 Other abnormal glucose: Secondary | ICD-10-CM

## 2016-02-18 DIAGNOSIS — Z79899 Other long term (current) drug therapy: Secondary | ICD-10-CM | POA: Diagnosis not present

## 2016-02-18 DIAGNOSIS — H9203 Otalgia, bilateral: Secondary | ICD-10-CM | POA: Diagnosis not present

## 2016-02-18 DIAGNOSIS — F325 Major depressive disorder, single episode, in full remission: Secondary | ICD-10-CM

## 2016-02-18 DIAGNOSIS — E782 Mixed hyperlipidemia: Secondary | ICD-10-CM | POA: Diagnosis not present

## 2016-02-18 DIAGNOSIS — H9190 Unspecified hearing loss, unspecified ear: Secondary | ICD-10-CM

## 2016-02-18 DIAGNOSIS — R6889 Other general symptoms and signs: Secondary | ICD-10-CM

## 2016-02-18 DIAGNOSIS — Z0000 Encounter for general adult medical examination without abnormal findings: Secondary | ICD-10-CM

## 2016-02-18 DIAGNOSIS — Z0001 Encounter for general adult medical examination with abnormal findings: Secondary | ICD-10-CM

## 2016-02-18 DIAGNOSIS — N400 Enlarged prostate without lower urinary tract symptoms: Secondary | ICD-10-CM

## 2016-02-18 DIAGNOSIS — M5136 Other intervertebral disc degeneration, lumbar region: Secondary | ICD-10-CM

## 2016-02-18 DIAGNOSIS — I10 Essential (primary) hypertension: Secondary | ICD-10-CM

## 2016-02-18 HISTORY — DX: Unspecified hearing loss, unspecified ear: H91.90

## 2016-02-18 LAB — CBC WITH DIFFERENTIAL/PLATELET
BASOS PCT: 0 %
Basophils Absolute: 0 cells/uL (ref 0–200)
EOS ABS: 136 {cells}/uL (ref 15–500)
Eosinophils Relative: 2 %
HEMATOCRIT: 45.2 % (ref 38.5–50.0)
HEMOGLOBIN: 15.4 g/dL (ref 13.2–17.1)
LYMPHS ABS: 2244 {cells}/uL (ref 850–3900)
Lymphocytes Relative: 33 %
MCH: 33.3 pg — ABNORMAL HIGH (ref 27.0–33.0)
MCHC: 34.1 g/dL (ref 32.0–36.0)
MCV: 97.6 fL (ref 80.0–100.0)
MONO ABS: 816 {cells}/uL (ref 200–950)
MPV: 10.1 fL (ref 7.5–12.5)
Monocytes Relative: 12 %
NEUTROS ABS: 3604 {cells}/uL (ref 1500–7800)
Neutrophils Relative %: 53 %
Platelets: 148 10*3/uL (ref 140–400)
RBC: 4.63 MIL/uL (ref 4.20–5.80)
RDW: 13.9 % (ref 11.0–15.0)
WBC: 6.8 10*3/uL (ref 3.8–10.8)

## 2016-02-18 LAB — TSH: TSH: 2.64 mIU/L (ref 0.40–4.50)

## 2016-02-18 LAB — BASIC METABOLIC PANEL WITH GFR
BUN: 17 mg/dL (ref 7–25)
CHLORIDE: 102 mmol/L (ref 98–110)
CO2: 25 mmol/L (ref 20–31)
Calcium: 9.2 mg/dL (ref 8.6–10.3)
Creat: 1 mg/dL (ref 0.70–1.18)
GFR, EST AFRICAN AMERICAN: 85 mL/min (ref >=60)
GFR, EST NON AFRICAN AMERICAN: 74 mL/min (ref >=60)
GLUCOSE: 86 mg/dL (ref 65–99)
POTASSIUM: 4.3 mmol/L (ref 3.5–5.3)
Sodium: 142 mmol/L (ref 135–146)

## 2016-02-18 LAB — HEPATIC FUNCTION PANEL
ALK PHOS: 51 U/L (ref 40–115)
ALT: 26 U/L (ref 9–46)
AST: 22 U/L (ref 10–35)
Albumin: 4.3 g/dL (ref 3.6–5.1)
BILIRUBIN INDIRECT: 0.5 mg/dL (ref 0.2–1.2)
Bilirubin, Direct: 0.1 mg/dL (ref <=0.2)
TOTAL PROTEIN: 6.6 g/dL (ref 6.1–8.1)
Total Bilirubin: 0.6 mg/dL (ref 0.2–1.2)

## 2016-02-18 LAB — LIPID PANEL
CHOL/HDL RATIO: 3.9 ratio (ref <=5.0)
Cholesterol: 181 mg/dL (ref 125–200)
HDL: 47 mg/dL (ref >=40)
LDL CALC: 79 mg/dL (ref <130)
TRIGLYCERIDES: 277 mg/dL — AB (ref <150)
VLDL: 55 mg/dL — AB (ref <30)

## 2016-02-18 LAB — HEMOGLOBIN A1C
Hgb A1c MFr Bld: 5.7 % — ABNORMAL HIGH (ref <5.7)
Mean Plasma Glucose: 117 mg/dL

## 2016-02-18 LAB — MAGNESIUM: MAGNESIUM: 2 mg/dL (ref 1.5–2.5)

## 2016-02-18 NOTE — Progress Notes (Signed)
MEDICARE ANNUAL WELLNESS VISIT AND FOLLOW UP Assessment:   1. Medicare annual wellness visit, subsequent  2. Essential hypertension - continue medications, DASH diet, exercise and monitor at home. Call if greater than 130/80.  - CBC with Differential/Platelet - BASIC METABOLIC PANEL WITH GFR - Hepatic function panel - TSH  3. Prediabetes Discussed general issues about diabetes pathophysiology and management., Educational material distributed., Suggested low cholesterol diet., Encouraged aerobic exercise., Discussed foot care., Reminded to get yearly retinal exam. - Hemoglobin A1c  4. Hyperlipidemia -continue medications, check lipids, decrease fatty foods, increase activity.  - Lipid panel  5. Morbid obesity, unspecified obesity type ([place]) Obesity with co morbidities- long discussion about weight loss, diet, and exercise  6. Vitamin D deficiency Continue supplement  7. Medication management - Magnesium  8. Depression, major, in remission ([place]) Depression- continue medications, stress management techniques discussed, increase water, good sleep hygiene discussed, increase exercise, and increase veggies.   9. DDD (degenerative disc disease), lumbar RICE, NSAIDS, exercises given, if not better get xray and PT referral or ortho referral.   10. BPH (benign prostatic hypertrophy) Controlled with meds  11. Glaucoma Follow up eye docgtor  12. Cerumen impaction, bilateral Cerumen impaction- stop using Qtips, irrigation used in the office without complications, use OTC drops/oil at home to prevent reoccurence  Over 30 minutes of exam, counseling, chart review, and critical decision making was performed  Future Appointments Date Time Provider [place]  11/28/2016 10:00 AM Unk Pinto, MD [facility] None     Plan:   During the course of the visit the patient was educated and counseled about appropriate screening and preventive services including:    Pneumococcal  vaccine   Influenza vaccine  Prevnar 13  Td vaccine  Screening electrocardiogram  Colorectal cancer screening  Diabetes screening  Glaucoma screening  Nutrition counseling    Subjective:  Cody Hamilton is a 75 y.o. male who presents for Medicare Annual Wellness Visit and 3 month follow up for HTN, hyperlipidemia, prediabetes, and vitamin D Def.   His blood pressure has not been checking at home, today their BP is BP: (!) 150/90 mmHg  He does not workout. He denies chest pain, shortness of breath, dizziness.  He is on cholesterol medication and denies myalgias. His cholesterol is at goal. The cholesterol last visit was:   Lab Results  Component Value Date   CHOL 177 10/19/2015   HDL 39* 10/19/2015   LDLCALC 72 10/19/2015   TRIG 332* 10/19/2015   CHOLHDL 4.5 10/19/2015    He has been working on diet and exercise for prediabetes, and denies paresthesia of the feet, polydipsia, polyuria and visual disturbances. Last A1C in the office was:  Lab Results  Component Value Date   HGBA1C 5.6 10/19/2015   Patient is on Vitamin D supplement.   Lab Results  Component Value Date   VD25OH 45 10/19/2015     BMI is Body mass index is 32.03 kg/(m^2)., he is working on diet and exercise. Wt Readings from Last 3 Encounters:  02/18/16 259 lb 12.8 oz (117.845 kg)  10/19/15 256 lb (116.121 kg)  07/21/15 250 lb (113.399 kg)    Medication Review: Current Outpatient Prescriptions on File Prior to Visit  Medication Sig Dispense Refill  . ALPRAZolam (XANAX) 1 MG tablet TAKE 1/2 TO 1 TABLET BY MOUTH THREE TIMES DAILY AS NEEDED 90 tablet 0  . aspirin 81 MG EC tablet Take 81 mg by mouth daily.      . Cholecalciferol (VITAMIN  D3) 10000 UNITS capsule Take 10,000 Units by mouth every other day.     . Cinnamon 500 MG capsule Take 500 mg by mouth 4 (four) times daily.    . enalapril (VASOTEC) 20 MG tablet TAKE 1 TABLET BY MOUTH EVERY DAY FOR BLOOD PRESSURE 90 tablet 3  . hydrochlorothiazide  (HYDRODIURIL) 12.5 MG tablet TAKE 1 TABLET BY MOUTH EVERY MORNING FOR BLOOD PRESSURE. 90 tablet 1  . latanoprost (XALATAN) 0.005 % ophthalmic solution Place 1 drop into both eyes at bedtime.    . magnesium oxide (MAG-OX) 400 MG tablet Take 400 mg by mouth 2 (two) times daily.     . Multiple Vitamin (MULTIVITAMIN) tablet Take 1 tablet by mouth daily.      . Omega-3 Fatty Acids (FISH OIL) 1200 MG CAPS Take 1 capsule by mouth 2 (two) times daily.     . pravastatin (PRAVACHOL) 40 MG tablet TAKE 1 TABLET BY MOUTH EVERY NIGHT AT BEDTIME FOR CHOLESTEROL 90 tablet 1  . vitamin C (ASCORBIC ACID) 500 MG tablet Take 500 mg by mouth daily.      . vitamin E 400 UNIT capsule Take 400 Units by mouth daily.       No current facility-administered medications on file prior to visit.    Allergies: Allergies  Allergen Reactions  . Viagra [Sildenafil Citrate]     Headache    Current Problems (verified) has Hyperlipidemia; Hypertension; Prediabetes; Vitamin D deficiency; BPH (benign prostatic hypertrophy); Glaucoma; DDD (degenerative disc disease), lumbar; Medication management; Morbid obesity (BMI 31.25) ; Depression, major, in remission ([place]); Medicare annual wellness visit, subsequent; and Decreased hearing on his problem list.  Screening Tests Immunization History  Administered Date(s) Administered  . Influenza-Unspecified 04/30/2014  . Pneumococcal Conjugate-13 06/08/2014  . Pneumococcal Polysaccharide-23 08/09/2009  . Td 08/09/2009   Preventative care: Last colonoscopy: 2009 Dr. 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 [facility] here for primary care 2. Dr. Kathlen Mody, eye doctor, last visit Feb 2017 3. Dr. Devonne Doughty, dentist, last visit May 2017 Patient Care Team: Unk Pinto, MD  as PCP - General (Internal Medicine) Josue Hector, MD as Consulting Physician (Cardiology) Laurence Spates, MD as Consulting Physician (Gastroenterology)  Surgical: He  has past surgical history that includes Inguinal hernia repair; Laparoscopic appendectomy; and Cardiac catheterization (1999). Family His family history includes Alzheimer's disease in his mother; Cancer in his father; Hyperlipidemia in his father and sister; Hypertension in his father, mother, and sister; Lymphoma in his father. Social history  He reports that he quit smoking about 14 years ago. He does not have any smokeless tobacco history on file. He reports that he does not drink alcohol or use illicit drugs.  MEDICARE WELLNESS OBJECTIVES: Physical activity: Current Exercise Habits: The patient does not participate in regular exercise at present Cardiac risk factors: Cardiac Risk Factors include: advanced age (>58men, >48 women);dyslipidemia;hypertension;male gender;sedentary lifestyle;obesity (BMI >30kg/m2) Depression/mood screen:   Depression screen [facility] 2/9 02/18/2016  Decreased Interest 0  Down, Depressed, Hopeless 0  PHQ - 2 Score 0    ADLs:  In your present state of health, do you have any difficulty performing the following activities: 02/18/2016 10/19/2015  Hearing? N N  Vision? N N  Difficulty concentrating or making decisions? N N  Walking or climbing stairs? N N  Dressing or bathing? N N  Doing  errands, shopping? N N  Preparing Food and eating ? N -  Using the Toilet? N -  In the past six months, have you accidently leaked urine? N -  Do you have problems with loss of bowel control? N -  Managing your Medications? N -  Managing your Finances? N -  Housekeeping or managing your Housekeeping? N -     Cognitive Testing  Alert? Yes  Normal Appearance?Yes  Oriented to person? Yes  Place? Yes   Time? Yes  Recall of three objects?  Yes  Can perform simple calculations? Yes  Displays appropriate  judgment?Yes  Can read the correct time from a watch face?Yes  EOL planning: Does patient have an advance directive?: Yes Type of Advance Directive: Healthcare Power of Attorney, Living will Does patient want to make changes to advanced directive?: No - Patient declined Copy of advanced directive(s) in chart?: No - copy requested   Objective:   Today's Vitals   02/18/16 1054  BP: 150/90  Pulse: 73  Temp: 97.9 F (36.6 C)  TempSrc: Temporal  Resp: 16  Height: 6' 3.5" (1.918 m)  Weight: 259 lb 12.8 oz (117.845 kg)  SpO2: 98%  PainSc: 0-No pain   Body mass index is 32.03 kg/(m^2).  General appearance: alert, no distress, WD/WN, male HEENT: normocephalic, sclerae anicteric, , bilateral with cerumen impaction, nares patent, no discharge or erythema, pharynx normal Oral cavity: MMM, no lesions Neck: supple, no lymphadenopathy, no thyromegaly, no masses Heart: RRR, normal S1, S2, no murmurs Lungs: CTA bilaterally, no wheezes, rhonchi, or rales Abdomen: +bs, soft, non tender, non distended, no masses, no hepatomegaly, no splenomegaly Musculoskeletal: nontender, no swelling, no obvious deformity Extremities: no edema, no cyanosis, no clubbing Pulses: 2+ symmetric, upper and lower extremities, normal cap refill Neurological: alert, oriented x 3, CN2-12 intact, strength normal upper extremities and lower extremities, sensation normal throughout, DTRs 2+ throughout, no cerebellar signs, gait normal Psychiatric: normal affect, behavior normal, pleasant   Medicare Attestation I have personally reviewed: The patient's medical and social history Their use of alcohol, tobacco or illicit drugs Their current medications and supplements The patient's functional ability including ADLs,fall risks, home safety risks, cognitive, and hearing and visual impairment Diet and physical activities Evidence for depression or mood disorders  The patient's weight, height, BMI, and visual acuity have  been recorded in the chart.  I have made referrals, counseling, and provided education to the patient based on review of the above and I have provided the patient with a written personalized care plan for preventive services.     Vicie Mutters, PA-C   02/18/2016

## 2016-02-18 NOTE — Patient Instructions (Addendum)
[employer] with no obligation # [phone] Do not have to be a member Tues-Sat 10-6  [employer]- free test with no obligation # [phone] MUST BE A MEMBER Call for store hours  Have had patient's get good cheaper hearing aids from [employer] The air version has good reviews.   Use a dropper or use a cap to put olive oil,mineral oil or canola oil in the effected ear- 2-3 times a week. Let it soak for 20-30 min then you can take a shower or use a baby bulb with warm water to wash out the ear wax.  Do not use Qtips

## 2016-02-23 ENCOUNTER — Other Ambulatory Visit: Payer: Self-pay | Admitting: Internal Medicine

## 2016-05-21 ENCOUNTER — Other Ambulatory Visit: Payer: Self-pay | Admitting: Internal Medicine

## 2016-06-02 ENCOUNTER — Other Ambulatory Visit: Payer: Self-pay | Admitting: Internal Medicine

## 2016-07-17 ENCOUNTER — Other Ambulatory Visit: Payer: Self-pay | Admitting: Internal Medicine

## 2016-07-17 NOTE — Telephone Encounter (Signed)
Please call Alprazolam 

## 2016-07-31 ENCOUNTER — Other Ambulatory Visit: Payer: Self-pay | Admitting: Internal Medicine

## 2016-08-01 ENCOUNTER — Other Ambulatory Visit: Payer: Self-pay | Admitting: Internal Medicine

## 2016-08-07 ENCOUNTER — Encounter: Payer: Self-pay | Admitting: Physician Assistant

## 2016-08-07 ENCOUNTER — Ambulatory Visit (INDEPENDENT_AMBULATORY_CARE_PROVIDER_SITE_OTHER): Payer: Medicare Other | Admitting: Physician Assistant

## 2016-08-07 VITALS — BP 138/90 | HR 80 | Temp 98.4°F | Resp 16 | Ht 75.5 in | Wt 260.0 lb

## 2016-08-07 DIAGNOSIS — J069 Acute upper respiratory infection, unspecified: Secondary | ICD-10-CM | POA: Diagnosis not present

## 2016-08-07 MED ORDER — PREDNISONE 20 MG PO TABS: ORAL_TABLET | ORAL | 0 refills | Status: DC

## 2016-08-07 NOTE — Patient Instructions (Signed)

## 2016-08-07 NOTE — Progress Notes (Signed)
Subjective:    Patient ID: Cody Hamilton, male    DOB: 1941/06/12, 76 y.o.   MRN: [id number]  HPI 76 y.o. WM presents with possible sinus infection x 3 days. He has had chills, sinus pain, just doing nasal saline. He states he has been improving, no fever, chills, just sinus congestion, no cough, SOB, CP.  Wife has been sick with sinus infection.   Blood pressure 138/90, pulse 80, temperature 98.4 F (36.9 C), resp. rate 16, height 6' 3.5" (1.918 m), weight 260 lb (117.9 kg), SpO2 97 %.  Medications Current Outpatient Prescriptions on File Prior to Visit  Medication Sig  . ALPRAZolam (XANAX) 1 MG tablet TAKE 0.5 TO 1 TABLET BY MOUTH THREE TIMES DAILY  . aspirin 81 MG EC tablet Take 81 mg by mouth daily.    . Cholecalciferol (VITAMIN D3) 10000 UNITS capsule Take 10,000 Units by mouth every other day.   . Cinnamon 500 MG capsule Take 500 mg by mouth 4 (four) times daily.  . enalapril (VASOTEC) 20 MG tablet TAKE 1 TABLET BY MOUTH EVERY DAY FOR BLOOD PRESSURE  . hydrochlorothiazide (HYDRODIURIL) 12.5 MG tablet TAKE 1 TABLET BY MOUTH EVERY MORNING FOR BLOOD PRESSURE.  . latanoprost (XALATAN) 0.005 % ophthalmic solution Place 1 drop into both eyes at bedtime.  . magnesium oxide (MAG-OX) 400 MG tablet Take 400 mg by mouth 2 (two) times daily.   . Multiple Vitamin (MULTIVITAMIN) tablet Take 1 tablet by mouth daily.    . Omega-3 Fatty Acids (FISH OIL) 1200 MG CAPS Take 1 capsule by mouth 2 (two) times daily.   . pravastatin (PRAVACHOL) 40 MG tablet TAKE 1 TABLET BY MOUTH EVERY NIGHT AT BEDTIME FOR CHOLESTEROL  . vitamin C (ASCORBIC ACID) 500 MG tablet Take 500 mg by mouth daily.    . vitamin E 400 UNIT capsule Take 400 Units by mouth daily.     No current facility-administered medications on file prior to visit.     Problem list He has Hyperlipidemia; Hypertension; Prediabetes; Vitamin D deficiency; BPH (benign prostatic hypertrophy); Glaucoma; DDD (degenerative disc disease), lumbar;  Medication management; Morbid obesity (BMI 31.25) ; Depression, major, in remission ([place]); Medicare annual wellness visit, subsequent; and Decreased hearing on his problem list.   Review of Systems  Constitutional: Negative for chills, diaphoresis and fever.  HENT: Positive for congestion, sinus pressure, sneezing and sore throat. Negative for ear pain, trouble swallowing and voice change.   Eyes: Negative.   Respiratory: Positive for cough. Negative for chest tightness, shortness of breath and wheezing.   Cardiovascular: Negative.   Gastrointestinal: Negative.   Genitourinary: Negative.   Musculoskeletal: Negative for neck pain.  Neurological: Negative.  Negative for headaches.       Objective:   Physical Exam  Constitutional: He is oriented to person, place, and time. He appears well-developed and well-nourished.  HENT:  Head: Normocephalic and atraumatic.  Right Ear: Hearing and tympanic membrane normal.  Left Ear: Hearing and tympanic membrane normal.  Nose: Right sinus exhibits maxillary sinus tenderness. Left sinus exhibits maxillary sinus tenderness.  Mouth/Throat: Uvula is midline and mucous membranes are normal. Posterior oropharyngeal erythema present. No oropharyngeal exudate, posterior oropharyngeal edema or tonsillar abscesses.  Eyes: Conjunctivae are normal. Pupils are equal, round, and reactive to light.  Neck: Normal range of motion. Neck supple.  Cardiovascular: Normal rate and regular rhythm.   Pulmonary/Chest: Effort normal and breath sounds normal.  Abdominal: Soft. Bowel sounds are normal.  Musculoskeletal: Normal range of motion.  Lymphadenopathy:    He has no cervical adenopathy.  Neurological: He is alert and oriented to person, place, and time.  Skin: Skin is warm and dry. No rash noted.      Assessment & Plan:  1. Upper respiratory tract infection, unspecified type Allergy pill, flonase, vit C/D, call if worse/not better - predniSONE (DELTASONE) 20 MG  tablet; 2 tablets daily for 3 days, 1 tablet daily for 4 days.  Dispense: 10 tablet; Refill: 0

## 2016-08-18 ENCOUNTER — Other Ambulatory Visit: Payer: Self-pay | Admitting: Internal Medicine

## 2016-08-28 ENCOUNTER — Other Ambulatory Visit: Payer: Self-pay | Admitting: Internal Medicine

## 2016-08-30 ENCOUNTER — Other Ambulatory Visit: Payer: Self-pay | Admitting: Internal Medicine

## 2016-09-20 ENCOUNTER — Encounter: Payer: Medicare Other | Admitting: Physician Assistant

## 2016-09-20 ENCOUNTER — Encounter: Payer: Self-pay | Admitting: Physician Assistant

## 2016-09-20 VITALS — BP 124/90 | HR 86 | Temp 97.0°F | Resp 16 | Ht 75.5 in | Wt 256.5 lb

## 2016-09-20 NOTE — Progress Notes (Signed)
This encounter was created in error - please disregard.

## 2016-09-21 DIAGNOSIS — H02825 Cysts of left lower eyelid: Secondary | ICD-10-CM | POA: Diagnosis not present

## 2016-09-21 DIAGNOSIS — H04123 Dry eye syndrome of bilateral lacrimal glands: Secondary | ICD-10-CM | POA: Diagnosis not present

## 2016-09-21 DIAGNOSIS — H402222 Chronic angle-closure glaucoma, left eye, moderate stage: Secondary | ICD-10-CM | POA: Diagnosis not present

## 2016-09-21 DIAGNOSIS — H402211 Chronic angle-closure glaucoma, right eye, mild stage: Secondary | ICD-10-CM | POA: Diagnosis not present

## 2016-10-11 ENCOUNTER — Encounter: Payer: Self-pay | Admitting: *Deleted

## 2016-11-15 ENCOUNTER — Other Ambulatory Visit: Payer: Self-pay | Admitting: Physician Assistant

## 2016-11-28 ENCOUNTER — Encounter: Payer: Self-pay | Admitting: Internal Medicine

## 2016-11-28 ENCOUNTER — Other Ambulatory Visit: Payer: Self-pay | Admitting: Internal Medicine

## 2016-12-26 ENCOUNTER — Ambulatory Visit (INDEPENDENT_AMBULATORY_CARE_PROVIDER_SITE_OTHER): Payer: Medicare Other | Admitting: Internal Medicine

## 2016-12-26 ENCOUNTER — Encounter: Payer: Self-pay | Admitting: Internal Medicine

## 2016-12-26 VITALS — BP 116/78 | HR 68 | Temp 97.2°F | Resp 16 | Ht 75.25 in | Wt 255.5 lb

## 2016-12-26 DIAGNOSIS — Z1212 Encounter for screening for malignant neoplasm of rectum: Secondary | ICD-10-CM

## 2016-12-26 DIAGNOSIS — E782 Mixed hyperlipidemia: Secondary | ICD-10-CM | POA: Diagnosis not present

## 2016-12-26 DIAGNOSIS — Z136 Encounter for screening for cardiovascular disorders: Secondary | ICD-10-CM

## 2016-12-26 DIAGNOSIS — Z0000 Encounter for general adult medical examination without abnormal findings: Secondary | ICD-10-CM

## 2016-12-26 DIAGNOSIS — I10 Essential (primary) hypertension: Secondary | ICD-10-CM

## 2016-12-26 DIAGNOSIS — E559 Vitamin D deficiency, unspecified: Secondary | ICD-10-CM

## 2016-12-26 DIAGNOSIS — Z6831 Body mass index (BMI) 31.0-31.9, adult: Secondary | ICD-10-CM

## 2016-12-26 DIAGNOSIS — N401 Enlarged prostate with lower urinary tract symptoms: Secondary | ICD-10-CM

## 2016-12-26 DIAGNOSIS — R7309 Other abnormal glucose: Secondary | ICD-10-CM | POA: Diagnosis not present

## 2016-12-26 DIAGNOSIS — Z125 Encounter for screening for malignant neoplasm of prostate: Secondary | ICD-10-CM | POA: Diagnosis not present

## 2016-12-26 DIAGNOSIS — E6609 Other obesity due to excess calories: Secondary | ICD-10-CM

## 2016-12-26 DIAGNOSIS — Z0001 Encounter for general adult medical examination with abnormal findings: Secondary | ICD-10-CM

## 2016-12-26 DIAGNOSIS — Z79899 Other long term (current) drug therapy: Secondary | ICD-10-CM

## 2016-12-26 LAB — BASIC METABOLIC PANEL WITH GFR
BUN: 17 mg/dL (ref 7–25)
CHLORIDE: 103 mmol/L (ref 98–110)
CO2: 24 mmol/L (ref 20–31)
CREATININE: 1.07 mg/dL (ref 0.70–1.18)
Calcium: 9.4 mg/dL (ref 8.6–10.3)
GFR, Est African American: 79 mL/min (ref >=60)
GFR, Est Non African American: 68 mL/min (ref >=60)
Glucose, Bld: 102 mg/dL — ABNORMAL HIGH (ref 65–99)
Potassium: 4.5 mmol/L (ref 3.5–5.3)
Sodium: 139 mmol/L (ref 135–146)

## 2016-12-26 LAB — TSH: TSH: 2.3 m[IU]/L (ref 0.40–4.50)

## 2016-12-26 LAB — CBC WITH DIFFERENTIAL/PLATELET
Basophils Absolute: 0 cells/uL (ref 0–200)
Basophils Relative: 0 %
Eosinophils Absolute: 78 cells/uL (ref 15–500)
Eosinophils Relative: 1 %
HCT: 45.7 % (ref 38.5–50.0)
HEMOGLOBIN: 15.3 g/dL (ref 13.2–17.1)
LYMPHS ABS: 1794 {cells}/uL (ref 850–3900)
Lymphocytes Relative: 23 %
MCH: 32 pg (ref 27.0–33.0)
MCHC: 33.5 g/dL (ref 32.0–36.0)
MCV: 95.6 fL (ref 80.0–100.0)
MONOS PCT: 8 %
MPV: 10.8 fL (ref 7.5–12.5)
Monocytes Absolute: 624 cells/uL (ref 200–950)
Neutro Abs: 5304 cells/uL (ref 1500–7800)
Neutrophils Relative %: 68 %
PLATELETS: 158 10*3/uL (ref 140–400)
RBC: 4.78 MIL/uL (ref 4.20–5.80)
RDW: 13.6 % (ref 11.0–15.0)
WBC: 7.8 10*3/uL (ref 3.8–10.8)

## 2016-12-26 LAB — LIPID PANEL
CHOL/HDL RATIO: 3.3 ratio (ref <5.0)
CHOLESTEROL: 165 mg/dL (ref <200)
HDL: 50 mg/dL (ref >40)
LDL Cholesterol: 93 mg/dL (ref <100)
Triglycerides: 108 mg/dL (ref <150)
VLDL: 22 mg/dL (ref <30)

## 2016-12-26 LAB — HEPATIC FUNCTION PANEL
ALT: 23 U/L (ref 9–46)
AST: 21 U/L (ref 10–35)
Albumin: 4.2 g/dL (ref 3.6–5.1)
Alkaline Phosphatase: 58 U/L (ref 40–115)
BILIRUBIN DIRECT: 0.2 mg/dL (ref <=0.2)
BILIRUBIN TOTAL: 0.8 mg/dL (ref 0.2–1.2)
Indirect Bilirubin: 0.6 mg/dL (ref 0.2–1.2)
Total Protein: 6.6 g/dL (ref 6.1–8.1)

## 2016-12-26 MED ORDER — TAMSULOSIN HCL 0.4 MG PO CAPS: ORAL_CAPSULE | ORAL | 3 refills | Status: DC

## 2016-12-26 NOTE — Patient Instructions (Signed)

## 2016-12-26 NOTE — Progress Notes (Signed)
[facility]   Unk Pinto, M.D.      Cody Hamilton, P.A.-C [facility]                [address] [place] [address] Telephone [phone] Telefax [phone] Annual  Screening/Preventative Visit  & Comprehensive Evaluation & Examination     This very nice 76 y.o. MWM presents for a Screening/Preventative Visit & comprehensive evaluation and management of multiple medical co-morbidities.  Patient has been followed for HTN, Prediabetes, Hyperlipidemia and Vitamin D Deficiency.     HTN predates circa 1990. Patient's BP has been controlled at home.  Today's BP is at goal - 116/78. Patient denies any cardiac symptoms as chest pain, palpitations, shortness of breath, dizziness or ankle swelling.     Patient's hyperlipidemia is controlled with diet and medications. Patient denies myalgias or other medication SE's. Last lipids were at goal albeit elevated Trig's: Lab Results  Component Value Date   CHOL 181 02/18/2016   HDL 47 02/18/2016   LDLCALC 79 02/18/2016   TRIG 277 (H) 02/18/2016   CHOLHDL 3.9 02/18/2016      Patient has Morbid Obesity (BMI 31+) and consequent prediabetes (A1c 5.7% in 2011 and in Dec 2016) ) and patient denies reactive hypoglycemic symptoms, visual blurring, diabetic polys or paresthesias. Last A1c was near goal: Lab Results  Component Value Date   HGBA1C 5.7 (H) 02/18/2016       Finally, patient has history of Vitamin D Deficiency ("36" in 2008) and last vitamin D was still low: Lab Results  Component Value Date   VD25OH 45 10/19/2015   Current Outpatient Prescriptions on File Prior to Visit  Medication Sig  . ALPRAZolam  1 MG tablet TAKE 0.5 TO 1 TAB THREE TIMES DAILY  . aspirin 81 MG EC tablet Take 81 mg by mouth daily.    Marland Kitchen VITAMIN D 10,000 UNITS le Take 10,000 Units by mouth every other day.   . Cinnamon 500 MG capsule Take  4 x daily.  . enalapril  (VASOTEC) 20 MG tablet TAKE 1 TABLET BY MOUTH EVERY DAY   . hctz 12.5 MG tablet TAKE 1 TABLET BY MOUTH EVERY MORNING   . XALATAN ophth soln Place 1 drop into both eyes at bedtime.  . magnesium  400 MG tablet Take 400 mg by mouth 2 (two) times daily.   . Multiple Vitamin  Take 1 tablet by mouth daily.    . Omega-3 FISH OIL 1200 MG  Take 1 capsule by mouth 2 (two) times daily.   . pravastatin  40 MG tablet TAKE 1 TABLET BY MOUTH EVERY NIGHT   . vitamin C  500 MG tablet Take 500 mg by mouth daily.    . vitamin E 400 UNIT Take 400 Units by mouth daily.     Allergies  Allergen Reactions  . Viagra [Sildenafil Citrate]     Headache   Past Medical History:  Diagnosis Date  . BPH (benign prostatic hypertrophy)   . Colon polyps   . Elevated hemoglobin A1c   . Glaucoma   . Hypertension   . Vitamin D deficiency    Health Maintenance  Topic Date Due  . INFLUENZA VACCINE  07/31/2017 (Originally 02/28/2017)  . COLONOSCOPY  07/31/2017  . TETANUS/TDAP  08/10/2019  . PNA vac Low Risk Adult  Completed   Immunization History  Administered Date(s) Administered  . Influenza-Unspecified 04/30/2014  . Pneumococcal Conjugate-13 06/08/2014  . Pneumococcal Polysaccharide-23 08/09/2009  . Td 08/09/2009   Past Surgical History:  Procedure Laterality Date  . CARDIAC CATHETERIZATION  1999  . INGUINAL HERNIA REPAIR    . LAPAROSCOPIC APPENDECTOMY     Family History  Problem Relation Age of Onset  . Hypertension Mother   . Alzheimer's disease Mother   . Hyperlipidemia Father   . Hypertension Father   . Cancer Father        esophagus  . Lymphoma Father   . Hypertension Sister   . Hyperlipidemia Sister    Social History   Social History  . Marital status: Married    Spouse name: N/A  . Number of children: N/A  . Years of education: N/A   Occupational History  . Not on file.   Social History Main Topics  . Smoking status: Former Smoker    Quit date: 07/31/2001  . Smokeless tobacco: Never  Used  . Alcohol use No  . Drug use: No  . Sexual activity: Not on file    ROS Constitutional: Denies fever, chills, weight loss/gain, headaches, insomnia,  night sweats or change in appetite. Does c/o fatigue. Eyes: Denies redness, blurred vision, diplopia, discharge, itchy or watery eyes.  ENT: Denies discharge, congestion, post nasal drip, epistaxis, sore throat, earache, hearing loss, dental pain, Tinnitus, Vertigo, Sinus pain or snoring.  Cardio: Denies chest pain, palpitations, irregular heartbeat, syncope, dyspnea, diaphoresis, orthopnea, PND, claudication or edema Respiratory: denies cough, dyspnea, DOE, pleurisy, hoarseness, laryngitis or wheezing.  Gastrointestinal: Denies dysphagia, heartburn, reflux, water brash, pain, cramps, nausea, vomiting, bloating, diarrhea, constipation, hematemesis, melena, hematochezia, jaundice or hemorrhoids Genitourinary: Denies dysuria, frequency, urgency, nocturia, hesitancy, discharge, hematuria or flank pain Musculoskeletal: Denies arthralgia, myalgia, stiffness, Jt. Swelling, pain, limp or strain/sprain. Denies Falls. Skin: Denies puritis, rash, hives, warts, acne, eczema or change in skin lesion Neuro: No weakness, tremor, incoordination, spasms, paresthesia or pain Psychiatric: Denies confusion, memory loss or sensory loss. Denies Depression. Endocrine: Denies change in weight, skin, hair change, nocturia, and paresthesia, diabetic polys, visual blurring or hyper / hypo glycemic episodes.  Heme/Lymph: No excessive bleeding, bruising or enlarged lymph nodes.  Physical Exam  BP 116/78   Pulse 68   Temp 97.2 F (36.2 C)   Resp 16   Ht 6' 3.25" (1.911 m)   Wt 255 lb 8 oz (115.9 kg)   BMI 31.72 kg/m   General Appearance: Well nourished and well groomed and in no apparent distress.  Eyes: PERRLA, EOMs, conjunctiva no swelling or erythema, normal fundi and vessels. Sinuses: No frontal/maxillary tenderness ENT/Mouth: EACs patent / TMs  nl.  Nares clear without erythema, swelling, mucoid exudates. Oral hygiene is good. No erythema, swelling, or exudate. Tongue normal, non-obstructing. Tonsils not swollen or erythematous. Hearing normal.  Neck: Supple, thyroid normal. No bruits, nodes or JVD. Respiratory: Respiratory effort normal.  BS equal and clear bilateral without rales, rhonci, wheezing or stridor. Cardio: Heart sounds are normal with regular rate and rhythm and no murmurs, rubs or gallops. Peripheral pulses are normal and equal bilaterally without edema. No aortic or femoral bruits. Chest: symmetric with normal excursions and percussion.  Abdomen: Soft, with Nl bowel sounds. Nontender, no guarding, rebound, hernias, masses, or organomegaly.  Lymphatics: Non tender without lymphadenopathy.  Genitourinary: No hernias.Testes nl. DRE - prostate nl for age - smooth & firm w/o nodules. Musculoskeletal: Full ROM all peripheral extremities, joint stability, 5/5 strength, and normal gait.  Skin: Warm and dry without rashes, lesions, cyanosis, clubbing or  ecchymosis.  Neuro: Cranial nerves intact, reflexes equal bilaterally. Normal muscle tone, no cerebellar symptoms. Sensation intact.  Pysch: Alert and oriented X 3 with normal affect, insight and judgment appropriate.   Assessment and Plan  1. Annual Preventative/Screening Exam   2. Essential hypertension  - EKG 12-Lead - [place], RETROPERITNL ABD,  LTD - Urinalysis, Routine w reflex microscopic - Microalbumin / creatinine urine ratio - CBC with Differential/Platelet - BASIC METABOLIC PANEL WITH GFR - Magnesium - TSH  3. Hyperlipidemia, mixed  - EKG 12-Lead - [place], RETROPERITNL ABD,  LTD - Hepatic function panel - Lipid panel  4. Prediabetes  - EKG 12-Lead - [place], RETROPERITNL ABD,  LTD - Hemoglobin A1c - Insulin, random  5. Vitamin D deficiency  - VITAMIN D 25 Hydroxy   6. Class 1 obesity due to excess calories with serious comorbidity and body mass index (BMI) of 31.0  to 31.9 in adult   7. Screening for rectal cancer  - POC Hemoccult Bld/Stl   8. Prostate cancer screening  - PSA  9. Screening for ischemic heart disease  - EKG 12-Lead - Lipid panel  10. Screening for AAA (aortic abdominal aneurysm)  - [place], RETROPERITNL ABD,  LTD  11. Medication management  - Urinalysis, Routine w reflex microscopic - Microalbumin / creatinine urine ratio - CBC with Differential/Platelet - BASIC METABOLIC PANEL WITH GFR - Hepatic function panel - Magnesium - Lipid panel - TSH - Hemoglobin A1c - Insulin, random - VITAMIN D 25 Hydroxy   12. Benign localized prostatic hyperplasia with lower urinary tract symptoms (LUTS)  - tamsulosin (FLOMAX) 0.4 MG CAPS capsule; Take 1 capsule at Bedtime for Prostate  Dispense: 90 capsule; Refill: 3       Patient was counseled in prudent diet, weight control to achieve/maintain BMI less than 25, BP monitoring, regular exercise and medications as discussed.  Discussed med effects and SE's. Routine screening labs and tests as requested with regular follow-up as recommended. Over 40 minutes of exam, counseling, chart review and high complex critical decision making was performed

## 2016-12-27 LAB — INSULIN, RANDOM: Insulin: 22.9 u[IU]/mL — ABNORMAL HIGH (ref 2.0–19.6)

## 2016-12-27 LAB — URINALYSIS, ROUTINE W REFLEX MICROSCOPIC
BILIRUBIN URINE: NEGATIVE
GLUCOSE, UA: NEGATIVE
Hgb urine dipstick: NEGATIVE
Ketones, ur: NEGATIVE
LEUKOCYTES UA: NEGATIVE
Nitrite: NEGATIVE
PH: 5.5 (ref 5.0–8.0)
Protein, ur: NEGATIVE
Specific Gravity, Urine: 1.013 (ref 1.001–1.035)

## 2016-12-27 LAB — PSA: PSA: 0.4 ng/mL (ref <=4.0)

## 2016-12-27 LAB — MICROALBUMIN / CREATININE URINE RATIO
Creatinine, Urine: 76 mg/dL (ref 20–370)
MICROALB/CREAT RATIO: 3 ug/mg{creat} (ref <30)
Microalb, Ur: 0.2 mg/dL

## 2016-12-27 LAB — VITAMIN D 25 HYDROXY (VIT D DEFICIENCY, FRACTURES): VIT D 25 HYDROXY: 84 ng/mL (ref 30–100)

## 2016-12-27 LAB — MAGNESIUM: MAGNESIUM: 1.9 mg/dL (ref 1.5–2.5)

## 2016-12-27 LAB — HEMOGLOBIN A1C
Hgb A1c MFr Bld: 5.4 % (ref <5.7)
Mean Plasma Glucose: 108 mg/dL

## 2017-01-04 ENCOUNTER — Other Ambulatory Visit: Payer: Self-pay

## 2017-01-04 DIAGNOSIS — Z1212 Encounter for screening for malignant neoplasm of rectum: Secondary | ICD-10-CM

## 2017-01-04 LAB — POC HEMOCCULT BLD/STL (HOME/3-CARD/SCREEN)
Card #2 Fecal Occult Blod, POC: NEGATIVE
FECAL OCCULT BLD: NEGATIVE
FECAL OCCULT BLD: NEGATIVE

## 2017-01-23 ENCOUNTER — Other Ambulatory Visit: Payer: Self-pay | Admitting: Internal Medicine

## 2017-01-24 NOTE — Telephone Encounter (Signed)
Xanax was called into pharmacy on 27th June 2018 @ 8:36am by DD

## 2017-03-20 DIAGNOSIS — H402222 Chronic angle-closure glaucoma, left eye, moderate stage: Secondary | ICD-10-CM | POA: Diagnosis not present

## 2017-03-20 DIAGNOSIS — H402211 Chronic angle-closure glaucoma, right eye, mild stage: Secondary | ICD-10-CM | POA: Diagnosis not present

## 2017-04-19 NOTE — Progress Notes (Signed)
MEDICARE ANNUAL WELLNESS VISIT AND FOLLOW UP Assessment:   Essential hypertension - continue medications, DASH diet, exercise and monitor at home. Call if greater than 130/80.  -     CBC with Differential/Platelet -     BASIC METABOLIC PANEL WITH GFR -     Hepatic function panel -     TSH  Hyperlipidemia -continue medications, check lipids, decrease fatty foods, increase activity.  -     Lipid panel  Prediabetes Discussed general issues about diabetes pathophysiology and management., Educational material distributed., Suggested low cholesterol diet., Encouraged aerobic exercise., Discussed foot care., Reminded to get yearly retinal exam. -     Hemoglobin A1c  Medication management -     Magnesium  Morbid obesity (BMI 31.25)  - long discussion about weight loss, diet, and exercise  Depression, major, in remission ([place]) Continue meds  DDD (degenerative disc disease), lumbar Continue meds PRN, follow up ortho  Benign prostatic hyperplasia with urinary frequency Continue medications  Vitamin D deficiency Continue supplement  Glaucoma, unspecified glaucoma type, unspecified laterality Follow up eye doctor  Decreased hearing of both ears Flush ears at home, no Qtips  Medicare annual wellness visit, subsequent Colonoscopy due next year  Gastroesophageal reflux disease without esophagitis -     omeprazole (PRILOSEC) 20 MG capsule; Take 1 capsule (20 mg total) by mouth daily.      - CP likely GERD, nonexertional, no accompaniments, go to ER if CP does not go away, or with exertion/any accompaniments, patient understands.    Over 30 minutes of exam, counseling, chart review, and critical decision making was performed  Future Appointments Date Time Provider [place]  07/25/2017 9:30 AM Unk Pinto, MD [facility] None  01/21/2018 10:00 AM Unk Pinto, MD [facility] None     Plan:   During the course of the visit the patient was educated and counseled  about appropriate screening and preventive services including:    Pneumococcal vaccine   Influenza vaccine  Prevnar 13  Td vaccine  Screening electrocardiogram  Colorectal cancer screening  Diabetes screening  Glaucoma screening  Nutrition counseling    Subjective:  Cody Hamilton is a 76 y.o. male who presents for Medicare Annual Wellness Visit and 3 month follow up for HTN, hyperlipidemia, prediabetes, and vitamin D Def.   His blood pressure has not been checking at home, today their BP is BP: 124/86  He does not workout. He denies chest pain, shortness of breath, dizziness. He has been having right shoulder and left knee pain, uses biofreeze and advil.  He states 1 week ago while cooking breakfast, has burning generalized chest pain, no accompaniments, lasted 10 mins, took tums but did not help, took bASA, nonexertional, after coffee and vitamins. Pushed mowed lawn recently without any issues.   He is on cholesterol medication and denies myalgias. His cholesterol is at goal. The cholesterol last visit was:   Lab Results  Component Value Date   CHOL 165 12/26/2016   HDL 50 12/26/2016   LDLCALC 93 12/26/2016   TRIG 108 12/26/2016   CHOLHDL 3.3 12/26/2016    He has been working on diet and exercise for prediabetes, and denies paresthesia of the feet, polydipsia, polyuria and visual disturbances. Last A1C in the office was:  Lab Results  Component Value Date   HGBA1C 5.4 12/26/2016   Patient is on Vitamin D supplement.   Lab Results  Component Value Date   VD25OH 84 12/26/2016     BMI is Body mass  index is 31.87 kg/m., he is working on diet and exercise. Wt Readings from Last 3 Encounters:  04/23/17 255 lb (115.7 kg)  12/26/16 255 lb 8 oz (115.9 kg)  09/20/16 256 lb 8 oz (116.3 kg)    Medication Review: Current Outpatient Prescriptions on File Prior to Visit  Medication Sig Dispense Refill  . ALPRAZolam (XANAX) 1 MG tablet TAKE 1/2-1 TABLET BY MOUTH THREE  TIMES DAILY 90 tablet 0  . aspirin 81 MG EC tablet Take 81 mg by mouth daily.      . Cholecalciferol (VITAMIN D3) 10000 UNITS capsule Take 10,000 Units by mouth every other day.     . Cinnamon 500 MG capsule Take 500 mg by mouth 4 (four) times daily.    . enalapril (VASOTEC) 20 MG tablet TAKE 1 TABLET BY MOUTH EVERY DAY FOR BLOOD PRESSURE 90 tablet 1  . hydrochlorothiazide (HYDRODIURIL) 12.5 MG tablet TAKE 1 TABLET BY MOUTH EVERY MORNING FOR BLOOD PRESSURE. 90 tablet 1  . latanoprost (XALATAN) 0.005 % ophthalmic solution Place 1 drop into both eyes at bedtime.    . magnesium oxide (MAG-OX) 400 MG tablet Take 400 mg by mouth 2 (two) times daily.     . Multiple Vitamin (MULTIVITAMIN) tablet Take 1 tablet by mouth daily.      . Omega-3 Fatty Acids (FISH OIL) 1200 MG CAPS Take 1 capsule by mouth 2 (two) times daily.     . pravastatin (PRAVACHOL) 40 MG tablet TAKE 1 TABLET BY MOUTH EVERY NIGHT AT BEDTIME FOR CHOLESTEROL 90 tablet 1  . tamsulosin (FLOMAX) 0.4 MG CAPS capsule Take 1 capsule at Bedtime for Prostate 90 capsule 3  . vitamin C (ASCORBIC ACID) 500 MG tablet Take 500 mg by mouth daily.       No current facility-administered medications on file prior to visit.     Allergies: Allergies  Allergen Reactions  . Viagra [Sildenafil Citrate]     Headache    Current Problems (verified) has Hyperlipidemia; Hypertension; Prediabetes; Vitamin D deficiency; Benign prostatic hyperplasia; Glaucoma; DDD (degenerative disc disease), lumbar; Medication management; Morbid obesity (BMI 31.25) ; Depression, major, in remission ([place]); Medicare annual wellness visit, subsequent; and Decreased hearing on his problem list.  Screening Tests Immunization History  Administered Date(s) Administered  . Influenza-Unspecified 04/30/2014  . Pneumococcal Conjugate-13 06/08/2014  . Pneumococcal Polysaccharide-23 08/09/2009  . Td 08/09/2009   Preventative care: Last colonoscopy: 2009 Dr. 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 [facility] here for primary care 2. Dr. Kathlen Mody, eye doctor, last visit Aug 2018 3. Dr. Devonne Doughty, dentist, last visit May 2018 Patient Care Team: Unk Pinto, MD as PCP - General (Internal Medicine) Josue Hector, MD as Consulting Physician (Cardiology) Laurence Spates, MD as Consulting Physician (Gastroenterology)  Surgical: He  has a past surgical history that includes Inguinal hernia repair; Laparoscopic appendectomy; and Cardiac catheterization (1999). Family His family history includes Alzheimer's disease in his mother; Cancer in his father; Hyperlipidemia in his father and sister; Hypertension in his father, mother, and sister; Lymphoma in his father. Social history  He reports that he quit smoking about 15 years ago. He has never used smokeless tobacco. He reports that he does not drink alcohol or use drugs.  MEDICARE WELLNESS OBJECTIVES: Physical activity: Current  Exercise Habits: The patient does not participate in regular exercise at present (Does yardwork) Cardiac risk factors: Cardiac Risk Factors include: advanced age (>45men, >44 women);dyslipidemia;hypertension;male gender;sedentary lifestyle;obesity (BMI >30kg/m2) Depression/mood screen:   Depression screen [facility] 2/9 04/23/2017  Decreased Interest 0  Down, Depressed, Hopeless 0  PHQ - 2 Score 0    ADLs:  In your present state of health, do you have any difficulty performing the following activities: 04/23/2017 12/26/2016  Hearing? N N  Vision? N N  Difficulty concentrating or making decisions? N N  Walking or climbing stairs? N N  Dressing or bathing? N N  Doing errands, shopping? N N  Some recent data might  be hidden     Cognitive Testing  Alert? Yes  Normal Appearance?Yes  Oriented to person? Yes  Place? Yes   Time? Yes  Recall of three objects?  Yes  Can perform simple calculations? Yes  Displays appropriate judgment?Yes  Can read the correct time from a watch face?Yes  EOL planning: Does Patient Have a Medical Advance Directive?: Yes Type of Advance Directive: Healthcare Power of Attorney, Living will Copy of [place] in Chart?: No - copy requested   Objective:   Today's Vitals   04/23/17 0844  BP: 124/86  Pulse: 66  Resp: 16  Temp: 97.9 F (36.6 C)  SpO2: 99%  Weight: 255 lb (115.7 kg)  Height: 6\' 3"  (1.905 m)  PainSc: 6   PainLoc: Shoulder   Body mass index is 31.87 kg/m.  General appearance: alert, no distress, WD/WN, male HEENT: normocephalic, sclerae anicteric, , bilateral with cerumen impaction, nares patent, no discharge or erythema, pharynx normal Oral cavity: MMM, no lesions Neck: supple, no lymphadenopathy, no thyromegaly, no masses Heart: RRR, normal S1, S2, no murmurs Lungs: CTA bilaterally, no wheezes, rhonchi, or rales Abdomen: +bs, soft, non tender, non distended, no masses, no hepatomegaly, no splenomegaly Musculoskeletal: nontender, no swelling, no obvious deformity Extremities: no edema, no cyanosis, no clubbing Pulses: 2+ symmetric, upper and lower extremities, normal cap refill Neurological: alert, oriented x 3, CN2-12 intact, strength normal upper extremities and lower extremities, sensation normal throughout, DTRs 2+ throughout, no cerebellar signs, gait normal Psychiatric: normal affect, behavior normal, pleasant   Medicare Attestation I have personally reviewed: The patient's medical and social history Their use of alcohol, tobacco or illicit drugs Their current medications and supplements The patient's functional ability including ADLs,fall risks, home safety risks, cognitive, and hearing and visual impairment Diet and  physical activities Evidence for depression or mood disorders  The patient's weight, height, BMI, and visual acuity have been recorded in the chart.  I have made referrals, counseling, and provided education to the patient based on review of the above and I have provided the patient with a written personalized care plan for preventive services.     Vicie Mutters, PA-C   04/23/2017

## 2017-04-23 ENCOUNTER — Other Ambulatory Visit: Payer: Self-pay | Admitting: Physician Assistant

## 2017-04-23 ENCOUNTER — Ambulatory Visit (INDEPENDENT_AMBULATORY_CARE_PROVIDER_SITE_OTHER): Payer: Medicare Other | Admitting: Physician Assistant

## 2017-04-23 ENCOUNTER — Encounter: Payer: Self-pay | Admitting: Physician Assistant

## 2017-04-23 ENCOUNTER — Ambulatory Visit: Payer: Self-pay | Admitting: Physician Assistant

## 2017-04-23 VITALS — BP 124/86 | HR 66 | Temp 97.9°F | Resp 16 | Ht 75.0 in | Wt 255.0 lb

## 2017-04-23 DIAGNOSIS — R350 Frequency of micturition: Secondary | ICD-10-CM

## 2017-04-23 DIAGNOSIS — E559 Vitamin D deficiency, unspecified: Secondary | ICD-10-CM | POA: Diagnosis not present

## 2017-04-23 DIAGNOSIS — H9193 Unspecified hearing loss, bilateral: Secondary | ICD-10-CM | POA: Diagnosis not present

## 2017-04-23 DIAGNOSIS — K219 Gastro-esophageal reflux disease without esophagitis: Secondary | ICD-10-CM

## 2017-04-23 DIAGNOSIS — N401 Enlarged prostate with lower urinary tract symptoms: Secondary | ICD-10-CM

## 2017-04-23 DIAGNOSIS — E782 Mixed hyperlipidemia: Secondary | ICD-10-CM | POA: Diagnosis not present

## 2017-04-23 DIAGNOSIS — R7309 Other abnormal glucose: Secondary | ICD-10-CM | POA: Diagnosis not present

## 2017-04-23 DIAGNOSIS — M5136 Other intervertebral disc degeneration, lumbar region: Secondary | ICD-10-CM

## 2017-04-23 DIAGNOSIS — F325 Major depressive disorder, single episode, in full remission: Secondary | ICD-10-CM | POA: Diagnosis not present

## 2017-04-23 DIAGNOSIS — Z79899 Other long term (current) drug therapy: Secondary | ICD-10-CM | POA: Diagnosis not present

## 2017-04-23 DIAGNOSIS — Z0000 Encounter for general adult medical examination without abnormal findings: Secondary | ICD-10-CM

## 2017-04-23 DIAGNOSIS — I10 Essential (primary) hypertension: Secondary | ICD-10-CM | POA: Diagnosis not present

## 2017-04-23 DIAGNOSIS — Z0001 Encounter for general adult medical examination with abnormal findings: Secondary | ICD-10-CM | POA: Diagnosis not present

## 2017-04-23 DIAGNOSIS — H409 Unspecified glaucoma: Secondary | ICD-10-CM

## 2017-04-23 DIAGNOSIS — R6889 Other general symptoms and signs: Secondary | ICD-10-CM | POA: Diagnosis not present

## 2017-04-23 MED ORDER — OMEPRAZOLE 20 MG PO CPDR: 20.0000 mg | DELAYED_RELEASE_CAPSULE | Freq: Every day | ORAL | 0 refills | Status: DC

## 2017-04-23 NOTE — Patient Instructions (Addendum)
Take omeprazole over the counter for 2 weeks, then go to zantac 150-300 mg at night for 2 weeks, then you can stop.  Avoid alcohol, spicy foods, NSAIDS (aleve, ibuprofen) at this time, and make sure you take with food. See foods below.   Food Choices for Gastroesophageal Reflux Disease When you have gastroesophageal reflux disease (GERD), the foods you eat and your eating habits are very important. Choosing the right foods can help ease the discomfort of GERD. WHAT GENERAL GUIDELINES DO I NEED TO FOLLOW?  Choose fruits, vegetables, whole grains, low-fat dairy products, and low-fat meat, fish, and poultry.  Limit fats such as oils, salad dressings, butter, nuts, and avocado.  Keep a food diary to identify foods that cause symptoms.  Avoid foods that cause reflux. These may be different for different people.  Eat frequent small meals instead of three large meals each day.  Eat your meals slowly, in a relaxed setting.  Limit fried foods.  Cook foods using methods other than frying.  Avoid drinking alcohol.  Avoid drinking large amounts of liquids with your meals.  Avoid bending over or lying down until 2-3 hours after eating. WHAT FOODS ARE NOT RECOMMENDED? The following are some foods and drinks that may worsen your symptoms: Vegetables Tomatoes. Tomato juice. Tomato and spaghetti sauce. Chili peppers. Onion and garlic. Horseradish. Fruits Oranges, grapefruit, and lemon (fruit and juice). Meats High-fat meats, fish, and poultry. This includes hot dogs, ribs, ham, sausage, salami, and bacon. Dairy Whole milk and chocolate milk. Sour cream. Cream. Butter. Ice cream. Cream cheese.  Beverages Coffee and tea, with or without caffeine. Carbonated beverages or energy drinks. Condiments Hot sauce. Barbecue sauce.  Sweets/Desserts Chocolate and cocoa. Donuts. Peppermint and spearmint. Fats and Oils High-fat foods, including [place] fries and potato chips. Other Vinegar. Strong  spices, such as black pepper, white pepper, red pepper, cayenne, curry powder, cloves, ginger, and chili powder.   Epidermal Cyst An epidermal cyst is a small, painless lump under your skin. It may be called an epidermal inclusion cyst or an infundibular cyst. The cyst contains a grayish-white, bad-smelling substance (keratin). It is important not to pop epidermal cysts yourself. These cysts are usually harmless (benign), but they can get infected. Symptoms of infection may include:  Redness.  Inflammation.  Tenderness.  Warmth.  Fever.  A grayish-white, bad-smelling substance draining from the cyst.  Pus draining from the cyst.  Follow these instructions at home:  Take over-the-counter and prescription medicines only as told by your doctor.  If you were prescribed an antibiotic, use it as told by your doctor. Do not stop using the antibiotic even if you start to feel better.  Keep the area around your cyst clean and dry.  Wear loose, dry clothing.  Do not try to pop your cyst.  Avoid touching your cyst.  Check your cyst every day for signs of infection.  Keep all follow-up visits as told by your doctor. This is important. How is this prevented?  Wear clean, dry, clothing.  Avoid wearing tight clothing.  Keep your skin clean and dry. Shower or take baths every day.  Wash your body with a benzoyl peroxide wash when you shower or bathe. Contact a health care provider if:  Your cyst has symptoms of infection.  Your condition is not improving or is getting worse.  You have a cyst that looks different from other cysts you have had.  You have a fever. Get help right away if:  Redness spreads  from the cyst into the surrounding area. This information is not intended to replace advice given to you by your health care provider. Make sure you discuss any questions you have with your health care provider. Document Released: 08/24/2004 Document Revised: 03/15/2016  Document Reviewed: 05/19/2015 Elsevier Interactive Patient Education  [employer].

## 2017-04-24 LAB — HEPATIC FUNCTION PANEL
AG Ratio: 1.9 (calc) (ref 1.0–2.5)
ALBUMIN MSPROF: 4.4 g/dL (ref 3.6–5.1)
ALT: 27 U/L (ref 9–46)
AST: 22 U/L (ref 10–35)
Alkaline phosphatase (APISO): 58 U/L (ref 40–115)
BILIRUBIN DIRECT: 0.2 mg/dL (ref 0.0–0.2)
BILIRUBIN TOTAL: 1 mg/dL (ref 0.2–1.2)
GLOBULIN: 2.3 g/dL (ref 1.9–3.7)
Indirect Bilirubin: 0.8 mg/dL (calc) (ref 0.2–1.2)
Total Protein: 6.7 g/dL (ref 6.1–8.1)

## 2017-04-24 LAB — LIPID PANEL
Cholesterol: 172 mg/dL (ref <200)
HDL: 51 mg/dL (ref >40)
LDL CHOLESTEROL (CALC): 96 mg/dL
NON-HDL CHOLESTEROL (CALC): 121 mg/dL (ref <130)
TRIGLYCERIDES: 149 mg/dL (ref <150)
Total CHOL/HDL Ratio: 3.4 (calc) (ref <5.0)

## 2017-04-24 LAB — CBC WITH DIFFERENTIAL/PLATELET
BASOS PCT: 0.6 %
Basophils Absolute: 38 cells/uL (ref 0–200)
Eosinophils Absolute: 151 cells/uL (ref 15–500)
Eosinophils Relative: 2.4 %
HEMATOCRIT: 43.5 % (ref 38.5–50.0)
Hemoglobin: 15.3 g/dL (ref 13.2–17.1)
Lymphs Abs: 1877 cells/uL (ref 850–3900)
MCH: 32.8 pg (ref 27.0–33.0)
MCHC: 35.2 g/dL (ref 32.0–36.0)
MCV: 93.1 fL (ref 80.0–100.0)
MPV: 11.1 fL (ref 7.5–12.5)
Monocytes Relative: 8.9 %
NEUTROS PCT: 58.3 %
Neutro Abs: 3673 cells/uL (ref 1500–7800)
Platelets: 172 10*3/uL (ref 140–400)
RBC: 4.67 10*6/uL (ref 4.20–5.80)
RDW: 12.6 % (ref 11.0–15.0)
Total Lymphocyte: 29.8 %
WBC: 6.3 10*3/uL (ref 3.8–10.8)
WBCMIX: 561 {cells}/uL (ref 200–950)

## 2017-04-24 LAB — HEMOGLOBIN A1C
EAG (MMOL/L): 6.3 (calc)
Hgb A1c MFr Bld: 5.6 % of total Hgb (ref <5.7)
MEAN PLASMA GLUCOSE: 114 (calc)

## 2017-04-24 LAB — BASIC METABOLIC PANEL WITH GFR
BUN: 17 mg/dL (ref 7–25)
CALCIUM: 9.4 mg/dL (ref 8.6–10.3)
CO2: 27 mmol/L (ref 20–32)
Chloride: 104 mmol/L (ref 98–110)
Creat: 1.02 mg/dL (ref 0.70–1.18)
GFR, EST AFRICAN AMERICAN: 83 mL/min/{1.73_m2} (ref >=60)
GFR, EST NON AFRICAN AMERICAN: 72 mL/min/{1.73_m2} (ref >=60)
Glucose, Bld: 110 mg/dL — ABNORMAL HIGH (ref 65–99)
Potassium: 4.1 mmol/L (ref 3.5–5.3)
Sodium: 139 mmol/L (ref 135–146)

## 2017-04-24 LAB — MAGNESIUM: Magnesium: 2 mg/dL (ref 1.5–2.5)

## 2017-04-24 LAB — TSH: TSH: 2.47 m[IU]/L (ref 0.40–4.50)

## 2017-04-27 NOTE — Progress Notes (Signed)
Pt aware of lab results & voiced understanding of those results.

## 2017-05-18 ENCOUNTER — Other Ambulatory Visit: Payer: Self-pay | Admitting: Internal Medicine

## 2017-06-04 ENCOUNTER — Other Ambulatory Visit: Payer: Self-pay | Admitting: Internal Medicine

## 2017-07-04 ENCOUNTER — Ambulatory Visit (INDEPENDENT_AMBULATORY_CARE_PROVIDER_SITE_OTHER): Payer: Medicare Other | Admitting: Adult Health

## 2017-07-04 ENCOUNTER — Encounter: Payer: Self-pay | Admitting: Adult Health

## 2017-07-04 VITALS — BP 128/76 | HR 60 | Temp 97.3°F | Ht 75.0 in | Wt 259.0 lb

## 2017-07-04 DIAGNOSIS — R05 Cough: Secondary | ICD-10-CM | POA: Diagnosis not present

## 2017-07-04 DIAGNOSIS — R059 Cough, unspecified: Secondary | ICD-10-CM

## 2017-07-04 DIAGNOSIS — R058 Other specified cough: Secondary | ICD-10-CM

## 2017-07-04 MED ORDER — PROMETHAZINE-DM 6.25-15 MG/5ML PO SYRP: 5.0000 mL | ORAL_SOLUTION | Freq: Four times a day (QID) | ORAL | 1 refills | Status: DC | PRN

## 2017-07-04 MED ORDER — BENZONATATE 100 MG PO CAPS: 200.0000 mg | ORAL_CAPSULE | Freq: Three times a day (TID) | ORAL | 0 refills | Status: DC | PRN

## 2017-07-04 NOTE — Patient Instructions (Signed)
Avoid sudofed - pseudoephedrine - containing medications for your blood pressure.   HOW TO TREAT VIRAL COUGH AND COLD SYMPTOMS:  -Symptoms usually last at least 1 week with the worst symptoms being around day 4.  - colds usually start with a sore throat and end with a cough, and the cough can take 2 weeks to get better.  -No antibiotics are needed for colds, flu, sore throats, cough, bronchitis UNLESS symptoms are longer than 7 days OR if you are getting better then get drastically worse.  -There are a lot of combination medications (Dayquil, Nyquil, Vicks 44, tyelnol cold and sinus, ETC). Please look at the ingredients on the back so that you are treating the correct symptoms and not doubling up on medications/ingredients.    Medicines you can use  Nasal congestion  - pseudoephedrine (Sudafed)- behind the counter, do not use if you have high blood pressure, medicine that have -D in them.  - phenylephrine (Sudafed PE) -Dextormethorphan + chlorpheniramine (Coridcidin HBP)- okay if you have high blood pressure -Oxymetazoline (Afrin) nasal spray- LIMIT to 3 days -Saline nasal spray -Neti pot (used distilled or bottled water)  Ear pain/congestion  -pseudoephedrine (sudafed) - Nasonex/flonase nasal spray  Fever  -Acetaminophen (Tyelnol) -Ibuprofen (Advil, motrin, aleve)  Sore Throat  -Acetaminophen (Tyelnol) -Ibuprofen (Advil, motrin, aleve) -Drink a lot of water -Gargle with salt water - Rest your voice (don't talk) -Throat sprays -Cough drops  Body Aches  -Acetaminophen (Tyelnol) -Ibuprofen (Advil, motrin, aleve)  Headache  -Acetaminophen (Tyelnol) -Ibuprofen (Advil, motrin, aleve) - Exedrin, Exedrin Migraine  Allergy symptoms (cough, sneeze, runny nose, itchy eyes) -Claritin or loratadine cheapest but likely the weakest  -Zyrtec or certizine at night because it can make you sleepy -The strongest is allegra or fexafinadine  Cheapest at [employer], [employer],  [employer]  Cough  -Dextromethorphan (Delsym)- medicine that has DM in it -Guafenesin (Mucinex/Robitussin) - cough drops - drink lots of water  Chest Congestion  -Guafenesin (Mucinex/Robitussin)  Red Itchy Eyes  - Naphcon-A  Upset Stomach  - Bland diet (nothing spicy, greasy, fried, and high acid foods like tomatoes, oranges, berries) -OKAY- cereal, bread, soup, crackers, rice -Eat smaller more frequent meals -reduce caffeine, no alcohol -Loperamide (Imodium-AD) if diarrhea -Prevacid for heart burn  General health when sick  -Hydration -wash your hands frequently -keep surfaces clean -change pillow cases and sheets often -Get fresh air but do not exercise strenuously -Vitamin D, double up on it - Vitamin C -Zinc

## 2017-07-04 NOTE — Progress Notes (Signed)
Assessment and Plan:  Gail was seen today for cough.  Diagnoses and all orders for this visit:  Post-viral cough syndrome Recommended voice rest, hydration, cough drops/tea -     benzonatate (TESSALON PERLES) 100 MG capsule; Take 2 capsules (200 mg total) by mouth 3 (three) times daily as needed for cough (Max: 600mg  per day). -     promethazine-dextromethorphan (PROMETHAZINE-DM) 6.25-15 MG/5ML syrup; Take 5 mLs by mouth 4 (four) times daily as needed for cough.  Further disposition pending results of labs. Discussed med's effects and SE's.   Over 15 minutes of exam, counseling, chart review, and critical decision making was performed.   Future Appointments  Date Time Provider [place]  07/25/2017  9:30 AM Unk Pinto, MD [facility] None  01/21/2018 10:00 AM Unk Pinto, MD [facility] None    ------------------------------------------------------------------------------------------------------------------   HPI BP 128/76   Pulse 60   Temp (!) 97.3 F (36.3 C)   Ht 6\' 3"  (1.905 m)   Wt 259 lb (117.5 kg)   SpO2 95%   BMI 32.37 kg/m   76 y.o.male presents for dry hacking cough worse at night ongoing since Thanksgiving. He has been taking cough drops, other generic OTC cold medication. He denies any other symptoms - HA, dizziness, vision changes, fever/ chills, CP/SOB/wheezing, N/V, nasal discharge/congestions, sensation of postnasal drip, ear/face pressure, myalgias/body aches. No sore throat or hoarseness. He reports his wife has similar symptoms.   Former smoker, no respiratory history, denies acid reflux symptoms.   Past Medical History:  Diagnosis Date  . BPH (benign prostatic hypertrophy)   . Colon polyps   . Elevated hemoglobin A1c   . Glaucoma   . Hypertension   . Vitamin D deficiency      Allergies  Allergen Reactions  . Viagra [Sildenafil Citrate]     Headache    Current Outpatient Medications on File Prior to Visit  Medication Sig  .  ALPRAZolam (XANAX) 1 MG tablet TAKE 1/2-1 TABLET BY MOUTH THREE TIMES DAILY  . aspirin 81 MG EC tablet Take 81 mg by mouth daily.    . Cholecalciferol (VITAMIN D3) 10000 UNITS capsule Take 10,000 Units by mouth every other day.   . enalapril (VASOTEC) 20 MG tablet TAKE 1 TABLET BY MOUTH EVERY DAY FOR BLOOD PRESSURE  . hydrochlorothiazide (HYDRODIURIL) 12.5 MG tablet TAKE 1 TABLET BY MOUTH EVERY MORNING FOR BLOOD PRESSURE  . latanoprost (XALATAN) 0.005 % ophthalmic solution Place 1 drop into both eyes at bedtime.  . magnesium oxide (MAG-OX) 400 MG tablet Take 400 mg by mouth 2 (two) times daily.   . Multiple Vitamin (MULTIVITAMIN) tablet Take 1 tablet by mouth daily.    . Omega-3 Fatty Acids (FISH OIL) 1200 MG CAPS Take 1 capsule by mouth 2 (two) times daily.   Marland Kitchen omeprazole (PRILOSEC) 20 MG capsule TAKE 1 CAPSULE(20 MG) BY MOUTH DAILY  . pravastatin (PRAVACHOL) 40 MG tablet TAKE 1 TABLET BY MOUTH EVERY NIGHT AT BEDTIME FOR CHOLESTEROL  . tamsulosin (FLOMAX) 0.4 MG CAPS capsule Take 1 capsule at Bedtime for Prostate  . vitamin C (ASCORBIC ACID) 500 MG tablet Take 500 mg by mouth daily.    . Cinnamon 500 MG capsule Take 500 mg by mouth 4 (four) times daily.   No current facility-administered medications on file prior to visit.     ROS: Review of Systems  Constitutional: Negative for chills, diaphoresis, fever and malaise/fatigue.  HENT: Negative for congestion, ear discharge, ear pain, hearing loss, sinus pain, sore throat  and tinnitus.   Eyes: Negative for blurred vision, pain, discharge and redness.  Respiratory: Positive for cough. Negative for hemoptysis, sputum production, shortness of breath, wheezing and stridor.   Cardiovascular: Negative for chest pain, palpitations, orthopnea and leg swelling.  Gastrointestinal: Negative for abdominal pain, diarrhea, nausea and vomiting.  Genitourinary: Negative.   Musculoskeletal: Negative for joint pain and myalgias.  Skin: Negative for rash.   Neurological: Negative for dizziness, sensory change, weakness and headaches.  Endo/Heme/Allergies: Negative for environmental allergies.  Psychiatric/Behavioral: Negative.   All other systems reviewed and are negative.   Physical Exam:  BP 128/76   Pulse 60   Temp (!) 97.3 F (36.3 C)   Ht 6\' 3"  (1.905 m)   Wt 259 lb (117.5 kg)   SpO2 95%   BMI 32.37 kg/m   General Appearance: Well nourished, in no apparent distress. Eyes:  conjunctiva no swelling or erythema Sinuses: No Frontal/maxillary tenderness ENT/Mouth: Ext aud canals clear, TMs without erythema, bulging. No erythema, swelling, or exudate on post pharynx.  Tonsils not swollen or erythematous. Hearing normal.  Neck: Supple, thyroid normal.  Respiratory: Respiratory effort normal, BS equal bilaterally without rales, rhonchi, wheezing or stridor.  Cardio: RRR with no MRGs. Brisk peripheral pulses without edema.  Abdomen: Soft, + BS. Non tender Lymphatics: Non tender without lymphadenopathy.  Musculoskeletal:  normal gait.  Skin: Warm, dry without rashes, lesions, ecchymosis.  Neuro:  Normal muscle tone, no cerebellar symptoms. Sensation intact.  Psych: Awake and oriented X 3, normal affect, Insight and Judgment appropriate.     Izora Ribas, NP 11:02 AM [facility]

## 2017-07-25 ENCOUNTER — Ambulatory Visit: Payer: Self-pay | Admitting: Internal Medicine

## 2017-07-27 ENCOUNTER — Other Ambulatory Visit: Payer: Self-pay | Admitting: Physician Assistant

## 2017-07-27 DIAGNOSIS — K219 Gastro-esophageal reflux disease without esophagitis: Secondary | ICD-10-CM

## 2017-07-31 NOTE — Patient Instructions (Signed)

## 2017-07-31 NOTE — Progress Notes (Signed)
This very nice 77 y.o. MWM presents for 6 month follow up with Hypertension, Hyperlipidemia, Pre-Diabetes and Vitamin D Deficiency.  Patient has GERD controlled w/meds. Patirent has LUTSx's improved on Flomax.     Patient is treated for HTN (1990) & BP has been controlled at home. Today's BP is at goal - 116/78. Patient has had no complaints of any cardiac type chest pain, palpitations, dyspnea / orthopnea / PND, dizziness, claudication, or dependent edema.     Hyperlipidemia is controlled with diet & meds. Patient denies myalgias or other med SE's. Last Lipids were at goal: Lab Results  Component Value Date   CHOL 172 04/23/2017   HDL 51 04/23/2017   LDLCALC 93 12/26/2016   TRIG 149 04/23/2017   CHOLHDL 3.4 04/23/2017      Also, the patient has history of PreDiabetes (A1c 5.7%/2011 and 2016) and has had no symptoms of reactive hypoglycemia, diabetic polys, paresthesias or visual blurring.  Last A1c was Normal at goal: Lab Results  Component Value Date   HGBA1C 5.6 04/23/2017      Further, the patient also has history of Vitamin D Deficiency ("36"/2008) and supplements vitamin D without any suspected side-effects. Last vitamin D was Normal at goal:   Lab Results  Component Value Date   VD25OH 84 12/26/2016   Current Outpatient Medications on File Prior to Visit  Medication Sig  . ALPRAZolam (XANAX) 1 MG tablet TAKE 1/2-1 TABLET BY MOUTH THREE TIMES DAILY  . aspirin 81 MG EC tablet Take 81 mg by mouth daily.    . Cholecalciferol (VITAMIN D3) 10000 UNITS capsule Take 10,000 Units by mouth every other day.   . enalapril (VASOTEC) 20 MG tablet TAKE 1 TABLET BY MOUTH EVERY DAY FOR BLOOD PRESSURE  . hydrochlorothiazide (HYDRODIURIL) 12.5 MG tablet TAKE 1 TABLET BY MOUTH EVERY MORNING FOR BLOOD PRESSURE  . latanoprost (XALATAN) 0.005 % ophthalmic solution Place 1 drop into both eyes at bedtime.  . magnesium oxide (MAG-OX) 400 MG tablet Take 400 mg by mouth 2 (two) times daily.   .  Multiple Vitamin (MULTIVITAMIN) tablet Take 1 tablet by mouth daily.    . Omega-3 Fatty Acids (FISH OIL) 1200 MG CAPS Take 1 capsule by mouth 2 (two) times daily.   Marland Kitchen omeprazole (PRILOSEC) 20 MG capsule TAKE 1 CAPSULE(20 MG) BY MOUTH DAILY  . pravastatin (PRAVACHOL) 40 MG tablet TAKE 1 TABLET BY MOUTH EVERY NIGHT AT BEDTIME FOR CHOLESTEROL  . tamsulosin (FLOMAX) 0.4 MG CAPS capsule Take 1 capsule at Bedtime for Prostate  . vitamin C (ASCORBIC ACID) 500 MG tablet Take 500 mg by mouth daily.    . Cinnamon 500 MG capsule Take 500 mg by mouth 4 (four) times daily.   No current facility-administered medications on file prior to visit.    Allergies  Allergen Reactions  . Viagra [Sildenafil Citrate]     Headache   PMHx:   Past Medical History:  Diagnosis Date  . BPH (benign prostatic hypertrophy)   . Colon polyps   . Elevated hemoglobin A1c   . Glaucoma   . Hypertension   . Vitamin D deficiency    Immunization History  Administered Date(s) Administered  . Influenza-Unspecified 04/30/2014  . Pneumococcal Conjugate-13 06/08/2014  . Pneumococcal Polysaccharide-23 08/09/2009  . Td 08/09/2009   Past Surgical History:  Procedure Laterality Date  . CARDIAC CATHETERIZATION  1999  . INGUINAL HERNIA REPAIR    . LAPAROSCOPIC APPENDECTOMY     FHx:  Reviewed / unchanged  SHx:    Reviewed / unchanged  Systems Review:  Constitutional: Denies fever, chills, wt changes, headaches, insomnia, fatigue, night sweats, change in appetite. Eyes: Denies redness, blurred vision, diplopia, discharge, itchy, watery eyes.  ENT: Denies discharge, congestion, post nasal drip, epistaxis, sore throat, earache, hearing loss, dental pain, tinnitus, vertigo, sinus pain, snoring.  CV: Denies chest pain, palpitations, irregular heartbeat, syncope, dyspnea, diaphoresis, orthopnea, PND, claudication or edema. Respiratory: denies cough, dyspnea, DOE, pleurisy, hoarseness, laryngitis, wheezing.  Gastrointestinal:  Denies dysphagia, odynophagia, heartburn, reflux, water brash, abdominal pain or cramps, nausea, vomiting, bloating, diarrhea, constipation, hematemesis, melena, hematochezia  or hemorrhoids. Genitourinary: Denies dysuria, frequency, urgency, nocturia, hesitancy, discharge, hematuria or flank pain. Musculoskeletal: Denies arthralgias, myalgias, stiffness, jt. swelling, pain, limping or strain/sprain.  Skin: Denies pruritus, rash, hives, warts, acne, eczema or change in skin lesion(s). Neuro: No weakness, tremor, incoordination, spasms, paresthesia or pain. Psychiatric: Denies confusion, memory loss or sensory loss. Endo: Denies change in weight, skin or hair change.  Heme/Lymph: No excessive bleeding, bruising or enlarged lymph nodes.  Physical Exam  BP 116/78   Pulse 64   Temp (!) 97.5 F (36.4 C)   Resp 18   Ht 6\' 3"  (1.905 m)   Wt 253 lb 6.4 oz (114.9 kg)   BMI 31.67 kg/m   Appears well nourished, well groomed  and in no distress.  Eyes: PERRLA, EOMs, conjunctiva no swelling or erythema. Sinuses: No frontal/maxillary tenderness ENT/Mouth: EAC's clear, TM's nl w/o erythema, bulging. Nares clear w/o erythema, swelling, exudates. Oropharynx clear without erythema or exudates. Oral hygiene is good. Tongue normal, non obstructing. Hearing intact.  Neck: Supple. Thyroid nl. Car 2+/2+ without bruits, nodes or JVD. Chest: Respirations nl with BS clear & equal w/o rales, rhonchi, wheezing or stridor.  Cor: Heart sounds normal w/ regular rate and rhythm without sig. murmurs, gallops, clicks or rubs. Peripheral pulses normal and equal  without edema.  Abdomen: Soft & bowel sounds normal. Non-tender w/o guarding, rebound, hernias, masses or organomegaly.  Lymphatics: Unremarkable.  Musculoskeletal: Full ROM all peripheral extremities, joint stability, 5/5 strength and normal gait.  Skin: Warm, dry without exposed rashes, lesions or ecchymosis apparent.  Neuro: Cranial nerves intact, reflexes  equal bilaterally. Sensory-motor testing grossly intact. Tendon reflexes grossly intact.  Pysch: Alert & oriented x 3.  Insight and judgement nl & appropriate. No ideations.  Assessment and Plan:  1. Essential hypertension  - Continue medication, monitor blood pressure at home.  - Continue DASH diet. Reminder to go to the ER if any CP,  SOB, nausea, dizziness, severe HA, changes vision/speech.  - CBC with Differential/Platelet - BASIC METABOLIC PANEL WITH GFR - Magnesium - TSH  2. Hyperlipidemia, mixed  - Continue diet/meds, exercise,& lifestyle modifications.  - Continue monitor periodic cholesterol/liver & renal functions  - Hepatic function panel - Lipid panel - TSH  3. Prediabetes  - Hemoglobin A1c - Insulin, random  4. Vitamin D deficiency  - Continue diet, exercise, lifestyle modifications.  - Monitor appropriate labs. - Continue supplementation. - VITAMIN D 25 Hydroxy  5. Abnormal glucose  - Hemoglobin A1c - Insulin, random  6. Medication management  - CBC with Differential/Platelet - BASIC METABOLIC PANEL WITH GFR - Hepatic function panel - Magnesium - Lipid panel - TSH - Hemoglobin A1c - Insulin, random - VITAMIN D 25 Hydroxy         Discussed  regular exercise, BP monitoring, weight control to achieve/maintain BMI less than 25 and discussed med  and SE's. Recommended labs to assess and monitor clinical status with further disposition pending results of labs. Over 30 minutes of exam, counseling, chart review was performed.

## 2017-08-01 ENCOUNTER — Ambulatory Visit (INDEPENDENT_AMBULATORY_CARE_PROVIDER_SITE_OTHER): Payer: Medicare Other | Admitting: Internal Medicine

## 2017-08-01 ENCOUNTER — Encounter: Payer: Self-pay | Admitting: Internal Medicine

## 2017-08-01 VITALS — BP 116/78 | HR 64 | Temp 97.5°F | Resp 18 | Ht 75.0 in | Wt 253.4 lb

## 2017-08-01 DIAGNOSIS — E559 Vitamin D deficiency, unspecified: Secondary | ICD-10-CM

## 2017-08-01 DIAGNOSIS — Z79899 Other long term (current) drug therapy: Secondary | ICD-10-CM

## 2017-08-01 DIAGNOSIS — I10 Essential (primary) hypertension: Secondary | ICD-10-CM

## 2017-08-01 DIAGNOSIS — R7309 Other abnormal glucose: Secondary | ICD-10-CM

## 2017-08-01 DIAGNOSIS — E782 Mixed hyperlipidemia: Secondary | ICD-10-CM | POA: Diagnosis not present

## 2017-08-02 LAB — CBC WITH DIFFERENTIAL/PLATELET
BASOS PCT: 0.5 %
Basophils Absolute: 30 cells/uL (ref 0–200)
EOS ABS: 89 {cells}/uL (ref 15–500)
Eosinophils Relative: 1.5 %
HEMATOCRIT: 42.9 % (ref 38.5–50.0)
HEMOGLOBIN: 14.9 g/dL (ref 13.2–17.1)
LYMPHS ABS: 2183 {cells}/uL (ref 850–3900)
MCH: 32.8 pg (ref 27.0–33.0)
MCHC: 34.7 g/dL (ref 32.0–36.0)
MCV: 94.5 fL (ref 80.0–100.0)
MONOS PCT: 9.8 %
MPV: 10.9 fL (ref 7.5–12.5)
NEUTROS ABS: 3021 {cells}/uL (ref 1500–7800)
Neutrophils Relative %: 51.2 %
Platelets: 150 10*3/uL (ref 140–400)
RBC: 4.54 10*6/uL (ref 4.20–5.80)
RDW: 12.4 % (ref 11.0–15.0)
Total Lymphocyte: 37 %
WBC: 5.9 10*3/uL (ref 3.8–10.8)
WBCMIX: 578 {cells}/uL (ref 200–950)

## 2017-08-02 LAB — HEPATIC FUNCTION PANEL
AG Ratio: 2.2 (calc) (ref 1.0–2.5)
ALBUMIN MSPROF: 4.6 g/dL (ref 3.6–5.1)
ALT: 26 U/L (ref 9–46)
AST: 22 U/L (ref 10–35)
Alkaline phosphatase (APISO): 62 U/L (ref 40–115)
Bilirubin, Direct: 0.3 mg/dL — ABNORMAL HIGH (ref 0.0–0.2)
GLOBULIN: 2.1 g/dL (ref 1.9–3.7)
Indirect Bilirubin: 0.9 mg/dL (calc) (ref 0.2–1.2)
Total Bilirubin: 1.2 mg/dL (ref 0.2–1.2)
Total Protein: 6.7 g/dL (ref 6.1–8.1)

## 2017-08-02 LAB — VITAMIN D 25 HYDROXY (VIT D DEFICIENCY, FRACTURES): Vit D, 25-Hydroxy: 96 ng/mL (ref 30–100)

## 2017-08-02 LAB — BASIC METABOLIC PANEL WITH GFR
BUN: 17 mg/dL (ref 7–25)
CHLORIDE: 103 mmol/L (ref 98–110)
CO2: 33 mmol/L — ABNORMAL HIGH (ref 20–32)
Calcium: 9.4 mg/dL (ref 8.6–10.3)
Creat: 1.01 mg/dL (ref 0.70–1.18)
GFR, EST AFRICAN AMERICAN: 84 mL/min/{1.73_m2} (ref >=60)
GFR, Est Non African American: 72 mL/min/{1.73_m2} (ref >=60)
Glucose, Bld: 93 mg/dL (ref 65–99)
POTASSIUM: 4 mmol/L (ref 3.5–5.3)
Sodium: 142 mmol/L (ref 135–146)

## 2017-08-02 LAB — HEMOGLOBIN A1C
Hgb A1c MFr Bld: 5.5 % of total Hgb (ref <5.7)
Mean Plasma Glucose: 111 (calc)
eAG (mmol/L): 6.2 (calc)

## 2017-08-02 LAB — LIPID PANEL
Cholesterol: 173 mg/dL (ref <200)
HDL: 50 mg/dL (ref >40)
LDL CHOLESTEROL (CALC): 101 mg/dL — AB
Non-HDL Cholesterol (Calc): 123 mg/dL (calc) (ref <130)
Total CHOL/HDL Ratio: 3.5 (calc) (ref <5.0)
Triglycerides: 123 mg/dL (ref <150)

## 2017-08-02 LAB — INSULIN, RANDOM: INSULIN: 36 u[IU]/mL — AB (ref 2.0–19.6)

## 2017-08-02 LAB — TSH: TSH: 2.55 mIU/L (ref 0.40–4.50)

## 2017-08-02 LAB — MAGNESIUM: MAGNESIUM: 2 mg/dL (ref 1.5–2.5)

## 2017-09-02 ENCOUNTER — Other Ambulatory Visit: Payer: Self-pay | Admitting: Internal Medicine

## 2017-09-07 DIAGNOSIS — D126 Benign neoplasm of colon, unspecified: Secondary | ICD-10-CM | POA: Diagnosis not present

## 2017-09-07 DIAGNOSIS — Z1211 Encounter for screening for malignant neoplasm of colon: Secondary | ICD-10-CM | POA: Diagnosis not present

## 2017-09-07 DIAGNOSIS — K640 First degree hemorrhoids: Secondary | ICD-10-CM | POA: Diagnosis not present

## 2017-09-07 LAB — HM COLONOSCOPY

## 2017-09-11 DIAGNOSIS — D126 Benign neoplasm of colon, unspecified: Secondary | ICD-10-CM | POA: Diagnosis not present

## 2017-09-24 DIAGNOSIS — H402211 Chronic angle-closure glaucoma, right eye, mild stage: Secondary | ICD-10-CM | POA: Diagnosis not present

## 2017-09-24 DIAGNOSIS — H04123 Dry eye syndrome of bilateral lacrimal glands: Secondary | ICD-10-CM | POA: Diagnosis not present

## 2017-09-24 DIAGNOSIS — H402222 Chronic angle-closure glaucoma, left eye, moderate stage: Secondary | ICD-10-CM | POA: Diagnosis not present

## 2017-09-24 LAB — HM DIABETES EYE EXAM

## 2017-10-23 ENCOUNTER — Other Ambulatory Visit: Payer: Self-pay | Admitting: Internal Medicine

## 2017-10-23 DIAGNOSIS — K219 Gastro-esophageal reflux disease without esophagitis: Secondary | ICD-10-CM

## 2017-10-31 NOTE — Progress Notes (Signed)
FOLLOW UP  Assessment and Plan:   Hypertension Transient elevation today; on review well controlled with current medications, with home values at goal, will defer medication adjustment and reevaluate at follow up Monitor blood pressure at home; patient to call if consistently greater than 130/80 Continue DASH diet.   Reminder to go to the ER if any CP, SOB, nausea, dizziness, severe HA, changes vision/speech, left arm numbness and tingling and jaw pain.  Cholesterol Currently at goal; continue statin therapy  Continue low cholesterol diet and exercise.  Check lipid panel.   Other abnormal glucose Recent A1Cs at goal -  Discussed diet/exercise, weight management  Check A1C per patient request - has cut out all sugar/corn syrup and would like to see progress  Obesity with co morbidities Long discussion about weight loss, diet, and exercise - commended on 8 lb weight loss, discussed very close to being out of obese range and encouraged to continue efforts Recommended diet heavy in fruits and veggies and low in animal meats, cheeses, and dairy products, appropriate calorie intake Discussed ideal weight for height  Patient will work on continue with sugar and corn-syrup free diet - discussed importance of exercise and to try starting walking 15 min~30 min daily for cardiovascular health and memory Will follow up in 3 months  Vitamin D Def At goal at last visit; continue supplementation to maintain goal of 70-100 Defer Vit D level  Depression/anxiety Continue medications - use xanax sparingly, will add daily medication if having increasing depression/anxiety symptoms Lifestyle discussed: diet/exerise, sleep hygiene, stress management, hydration   Continue diet and meds as discussed. Further disposition pending results of labs. Discussed med's effects and SE's.   Over 30 minutes of exam, counseling, chart review, and critical decision making was performed.   Future Appointments   Date Time Provider [place]  02/19/2018  3:45 PM Unk Pinto, MD [facility] None    ----------------------------------------------------------------------------------------------------------------------  HPI 77 y.o. male  presents for 3 month follow up on hypertension, cholesterol, glucose management, obesity, depression/anxiety and vitamin D deficiency.   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 he currently uses melatonin at night for sleep and reports has not needed xanax in over 3 months.   BMI is Body mass index is 30.62 kg/m., he has been working on diet and exercise- he has cut out all sugar and high fructose corn syrup and has lost 8 lb since last visit.  Wt Readings from Last 3 Encounters:  11/01/17 245 lb (111.1 kg)  08/01/17 253 lb 6.4 oz (114.9 kg)  07/04/17 259 lb (117.5 kg)   His blood pressure has been controlled at home (120s/70s), today their BP is BP: (!) 145/84. On review he is typically well controlled in office running 120s/70-80s.   He does not workout - picks up during the summer. He denies chest pain, shortness of breath, dizziness.   He is on cholesterol medication (pravastatin 20 mg daily) and denies myalgias. His cholesterol is at goal. The cholesterol last visit was:   Lab Results  Component Value Date   CHOL 173 08/01/2017   HDL 50 08/01/2017   LDLCALC 101 (H) 08/01/2017   TRIG 123 08/01/2017   CHOLHDL 3.5 08/01/2017    He has been working on diet and exercise for glucose management, and denies increased appetite, nausea, paresthesia of the feet, polydipsia, polyuria, visual  disturbances, vomiting and weight loss. Last A1C in the office was:  Lab Results  Component Value Date   HGBA1C 5.5 08/01/2017   Patient is on Vitamin D supplement and at goal at recent check:   Lab Results  Component Value Date   VD25OH 96 08/01/2017        Current Medications:  Current Outpatient Medications on File Prior to Visit  Medication Sig  . ALPRAZolam (XANAX) 1 MG tablet TAKE 1/2-1 TABLET BY MOUTH THREE TIMES DAILY  . APPLE CIDER VINEGAR PO Take by mouth.  Marland Kitchen aspirin 81 MG EC tablet Take 81 mg by mouth daily.    . Cholecalciferol (VITAMIN D3) 10000 UNITS capsule Take 10,000 Units by mouth every other day.   . enalapril (VASOTEC) 20 MG tablet TAKE 1 TABLET BY MOUTH EVERY DAY FOR BLOOD PRESSURE  . hydrochlorothiazide (HYDRODIURIL) 12.5 MG tablet TAKE 1 TABLET BY MOUTH EVERY MORNING FOR BLOOD PRESSURE  . latanoprost (XALATAN) 0.005 % ophthalmic solution Place 1 drop into both eyes at bedtime.  . magnesium oxide (MAG-OX) 400 MG tablet Take 400 mg by mouth 2 (two) times daily.   . Multiple Vitamin (MULTIVITAMIN) tablet Take 1 tablet by mouth daily.    . Omega-3 Fatty Acids (FISH OIL) 1200 MG CAPS Take 1 capsule by mouth 2 (two) times daily.   Marland Kitchen omeprazole (PRILOSEC) 20 MG capsule TAKE 1 CAPSULE(20 MG) BY MOUTH DAILY  . pravastatin (PRAVACHOL) 40 MG tablet TAKE 1 TABLET BY MOUTH EVERY NIGHT AT BEDTIME FOR CHOLESTEROL  . tamsulosin (FLOMAX) 0.4 MG CAPS capsule Take 1 capsule at Bedtime for Prostate  . vitamin C (ASCORBIC ACID) 500 MG tablet Take 500 mg by mouth daily.    . Cinnamon 500 MG capsule Take 500 mg by mouth 4 (four) times daily.   No current facility-administered medications on file prior to visit.      Allergies:  Allergies  Allergen Reactions  . Viagra [Sildenafil Citrate]     Headache     Medical History:  Past Medical History:  Diagnosis Date  . BPH (benign prostatic hypertrophy)   . Colon polyps   . Elevated hemoglobin A1c   . Glaucoma   . Hypertension   . Vitamin D deficiency    Family history- Reviewed and unchanged Social history- Reviewed and unchanged   Review of Systems:  Review of Systems  Constitutional: Negative for  malaise/fatigue and weight loss.  HENT: Negative for hearing loss and tinnitus.   Eyes: Negative for blurred vision and double vision.  Respiratory: Negative for cough, shortness of breath and wheezing.   Cardiovascular: Negative for chest pain, palpitations, orthopnea, claudication and leg swelling.  Gastrointestinal: Negative for abdominal pain, blood in stool, constipation, diarrhea, heartburn, melena, nausea and vomiting.  Genitourinary: Negative.   Musculoskeletal: Negative for joint pain and myalgias.  Skin: Negative for rash.  Neurological: Negative for dizziness, tingling, sensory change, weakness and headaches.  Endo/Heme/Allergies: Negative for polydipsia.  Psychiatric/Behavioral: Negative.   All other systems reviewed and are negative.     Physical Exam: BP (!) 145/84   Pulse 80   Temp 97.7 F (36.5 C)   Ht 6\' 3"  (1.905 m)   Wt 245 lb (111.1 kg)   SpO2 97%   BMI 30.62 kg/m  Wt Readings from Last 3 Encounters:  11/01/17 245 lb (111.1 kg)  08/01/17 253 lb 6.4 oz (114.9 kg)  07/04/17 259 lb (117.5 kg)   General Appearance: Well nourished, in no apparent distress. Eyes: PERRLA, EOMs,  conjunctiva no swelling or erythema Sinuses: No Frontal/maxillary tenderness ENT/Mouth: Ext aud canals clear, TMs without erythema, bulging. No erythema, swelling, or exudate on post pharynx.  Tonsils not swollen or erythematous. Hearing normal.  Neck: Supple, thyroid normal.  Respiratory: Respiratory effort normal, BS equal bilaterally without rales, rhonchi, wheezing or stridor.  Cardio: RRR with no MRGs. Brisk peripheral pulses without edema.  Abdomen: Soft, + BS.  Non tender, no guarding, rebound, hernias, masses. Lymphatics: Non tender without lymphadenopathy.  Musculoskeletal: Full ROM, 5/5 strength, Normal gait Skin: Warm, dry without rashes, lesions, ecchymosis.  Neuro: Cranial nerves intact. No cerebellar symptoms.  Psych: Awake and oriented X 3, normal affect, Insight and  Judgment appropriate.    Izora Ribas, NP 10:55 AM [facility]

## 2017-11-01 ENCOUNTER — Encounter: Payer: Self-pay | Admitting: Adult Health

## 2017-11-01 ENCOUNTER — Ambulatory Visit (INDEPENDENT_AMBULATORY_CARE_PROVIDER_SITE_OTHER): Payer: Medicare Other | Admitting: Adult Health

## 2017-11-01 VITALS — BP 145/84 | HR 80 | Temp 97.7°F | Ht 75.0 in | Wt 245.0 lb

## 2017-11-01 DIAGNOSIS — Z79899 Other long term (current) drug therapy: Secondary | ICD-10-CM | POA: Diagnosis not present

## 2017-11-01 DIAGNOSIS — E782 Mixed hyperlipidemia: Secondary | ICD-10-CM | POA: Diagnosis not present

## 2017-11-01 DIAGNOSIS — F325 Major depressive disorder, single episode, in full remission: Secondary | ICD-10-CM | POA: Diagnosis not present

## 2017-11-01 DIAGNOSIS — E669 Obesity, unspecified: Secondary | ICD-10-CM

## 2017-11-01 DIAGNOSIS — I10 Essential (primary) hypertension: Secondary | ICD-10-CM | POA: Diagnosis not present

## 2017-11-01 DIAGNOSIS — E559 Vitamin D deficiency, unspecified: Secondary | ICD-10-CM | POA: Diagnosis not present

## 2017-11-01 DIAGNOSIS — R7309 Other abnormal glucose: Secondary | ICD-10-CM

## 2017-11-01 NOTE — Patient Instructions (Signed)
Aim for 7+ servings of fruits and vegetables daily  80+ fluid ounces of water or unsweet tea for healthy kidneys  Limit alcohol intake  Limit animal fats in diet for cholesterol and heart health - choose grass fed whenever available  Aim for low stress - take time to unwind and care for your mental health  Aim for 150 min of moderate intensity exercise weekly for heart health, and weights twice weekly for bone health  Aim for 7-9 hours of sleep daily   Eating Plan for Brain Health A healthy diet is an important part of overall wellness, and it may help to improve brain function. Eating a healthy diet can lower the risk of having memory-loss diseases such as Alzheimer disease or dementia, or it can slow down the progression of those diseases. Depending on your overall health and any other health conditions you have, you may need to follow specific diet guidelines. Work with your health care provider or diet and nutrition specialist (dietitian) to find and follow an eating plan that is right for you. What are tips for following this plan? Reading food labels  Check food labels for Daily Value (DV) percentages. Aim for a DV of 5% or less for: ? Saturated fats. ? Trans fats. ? Cholesterol. ? Salt (sodium).  Choose whole grains instead of processed grains. The first ingredient in the ingredient list should include words like "whole grain" or "whole wheat." Shopping  Before you go grocery shopping, plan your meals and make a shopping list.  Look for lean meats, fish, low-fat dairy products, fruits and vegetables, and whole grains.  Avoid buying processed or pre-made foods. These are higher in added sugar, fat, and sodium. Cooking  Use healthy oils such as olive oil, instead of butter or margarine.  Avoid frying foods. Healthier ways of cooking include roasting, baking, poaching, and steaming.  Keep in mind that many healthy foods can be prepared without cooking, such as tuna, nuts,  beans, and fruits. Meal planning  Plan to eat one or more servings of each of these every day: ? Green leafy vegetables. ? Nuts. ? Whole grains.  Plan to eat berries, beans, fish, and poultry two or more times every week.  Avoid red meats, butter, cheese, sweets, and fried foods.  Eat several small meals throughout the day. For example, eat 6 small meals rather than 3 full-size meals. General tips  If you have difficulty chewing or swallowing: ? Choose foods that are tender, soft, and moist. ? Avoid foods that are sticky, hard, dry, chewy, or crunchy. ? Cut foods into pieces that are smaller than your thumbnail.  If you are underweight: ? Add extra protein or calories to meals by adding cream, nut butters, or protein powder to foods and drinks. ? Drink nutritional supplement shakes as told by your health care provider or dietitian.  Drink plenty of fluids to keep your urine pale yellow.  Limit alcohol intake to no more than 1 drink a day for nonpregnant women and 2 drinks a day for men. One drink equals 12 oz of beer, 5 oz of wine, or 1 oz of hard liquor.  Take daily vitamin and mineral supplements as told by your health care provider or dietitian.  Follow daily calorie and nutrient intake goals as told by your dietitian. What foods are recommended?  Foods that are high in omega-3s (omega-3 fatty acids). Omega-3s are often found in coldwater fish. They are also found in ground flaxseed, walnuts, edamame, and  seaweed. ? It is recommended that adults eat 8 oz (230 g) or more of fish or other seafood every week. ? The best kinds of fish for omega-3s are wild salmon, albacore, tuna, sardines, and farmed trout. ? It is important to bake or grill fish instead of frying it, to avoid unhealthy fats.  Foods that contain vitamins C, D, and E. These foods include: ? Avocados. ? Beans. ? Nuts and seeds. ? Green leafy vegetables, like spinach and kale. ? Cruciferous vegetables, like  broccoli or Brussels sprouts.  Cherries and berries, especially blackberries and blueberries. Berries have antioxidants and nutrients that support memory.  Whole grains, such as oatmeal, whole-grain cereal, whole-grain bread, brown rice, and barley soup.  Herbs and spices. Cooking with certain herbs and spices, such as turmeric, can help you absorb vitamins.  Foods in the [place] diet or the DASH (Dietary Approaches to Stop Hypertension) diet. Your health care provider may recommend eating a combination of these diets. These diets recommend that you: ? Eat green leafy vegetables, nuts, and whole grains every day. ? Drink one glass of wine a day. ? Eat berries, beans, fish, and poultry two or more times every week. ? Limit red meats, butter, cheese, sweets, fried foods, and fast food to once a week or less.  Healthy breakfast foods, such as whole-grain cereal, low-fat yogurt, berries or vegetables, and nuts. Eating breakfast every day can boost brain function and concentration for people of all ages. What foods are not recommended?  Foods that are high in trans fats, including: ? Fried foods. ? Snack foods. ? Pastries like cookies and donuts, especially the ones that come prepackaged.  Foods that are high in saturated fats, including: ? Processed meats, like sausage or deli meat. ? Red meat. ? Certain dairy products like butter, margarine, and certain cheeses.  Processed grains, such as white bread.  Sweets and fast food. Limit these types of food to once a week or less. Summary  Eating a nutritious diet can support brain health as well as overall wellness.  Choose foods that are high in omega-3 fatty acids, vitamins, and whole grains.  Avoid foods that contain trans fats and saturated fats, and limit sweets and fast food. This information is not intended to replace advice given to you by your health care provider. Make sure you discuss any questions you have with your  health care provider. Document Released: 11/20/2016 Document Revised: 11/20/2016 Document Reviewed: 11/20/2016 Elsevier Interactive Patient Education  [employer].

## 2017-11-02 LAB — CBC WITH DIFFERENTIAL/PLATELET
BASOS ABS: 18 {cells}/uL (ref 0–200)
Basophils Relative: 0.3 %
EOS ABS: 73 {cells}/uL (ref 15–500)
Eosinophils Relative: 1.2 %
HEMATOCRIT: 44.7 % (ref 38.5–50.0)
Hemoglobin: 15.7 g/dL (ref 13.2–17.1)
LYMPHS ABS: 2019 {cells}/uL (ref 850–3900)
MCH: 32.7 pg (ref 27.0–33.0)
MCHC: 35.1 g/dL (ref 32.0–36.0)
MCV: 93.1 fL (ref 80.0–100.0)
MPV: 10.9 fL (ref 7.5–12.5)
Monocytes Relative: 7.7 %
NEUTROS PCT: 57.7 %
Neutro Abs: 3520 cells/uL (ref 1500–7800)
PLATELETS: 161 10*3/uL (ref 140–400)
RBC: 4.8 10*6/uL (ref 4.20–5.80)
RDW: 12.6 % (ref 11.0–15.0)
TOTAL LYMPHOCYTE: 33.1 %
WBC: 6.1 10*3/uL (ref 3.8–10.8)
WBCMIX: 470 {cells}/uL (ref 200–950)

## 2017-11-02 LAB — LIPID PANEL
Cholesterol: 182 mg/dL (ref <200)
HDL: 54 mg/dL (ref >40)
LDL CHOLESTEROL (CALC): 108 mg/dL — AB
NON-HDL CHOLESTEROL (CALC): 128 mg/dL (ref <130)
TRIGLYCERIDES: 106 mg/dL (ref <150)
Total CHOL/HDL Ratio: 3.4 (calc) (ref <5.0)

## 2017-11-02 LAB — HEPATIC FUNCTION PANEL
AG RATIO: 1.8 (calc) (ref 1.0–2.5)
ALBUMIN MSPROF: 4.6 g/dL (ref 3.6–5.1)
ALKALINE PHOSPHATASE (APISO): 74 U/L (ref 40–115)
ALT: 25 U/L (ref 9–46)
AST: 20 U/L (ref 10–35)
BILIRUBIN TOTAL: 0.9 mg/dL (ref 0.2–1.2)
Bilirubin, Direct: 0.2 mg/dL (ref 0.0–0.2)
Globulin: 2.6 g/dL (calc) (ref 1.9–3.7)
Indirect Bilirubin: 0.7 mg/dL (calc) (ref 0.2–1.2)
TOTAL PROTEIN: 7.2 g/dL (ref 6.1–8.1)

## 2017-11-02 LAB — BASIC METABOLIC PANEL WITH GFR
BUN: 17 mg/dL (ref 7–25)
CALCIUM: 9.8 mg/dL (ref 8.6–10.3)
CO2: 29 mmol/L (ref 20–32)
CREATININE: 0.9 mg/dL (ref 0.70–1.18)
Chloride: 105 mmol/L (ref 98–110)
GFR, EST NON AFRICAN AMERICAN: 83 mL/min/{1.73_m2} (ref >=60)
GFR, Est African American: 96 mL/min/{1.73_m2} (ref >=60)
GLUCOSE: 97 mg/dL (ref 65–99)
Potassium: 4.4 mmol/L (ref 3.5–5.3)
SODIUM: 142 mmol/L (ref 135–146)

## 2017-11-02 LAB — HEMOGLOBIN A1C
Hgb A1c MFr Bld: 5.6 % of total Hgb (ref <5.7)
Mean Plasma Glucose: 114 (calc)
eAG (mmol/L): 6.3 (calc)

## 2017-11-02 LAB — TSH: TSH: 2.07 mIU/L (ref 0.40–4.50)

## 2017-12-19 ENCOUNTER — Encounter: Payer: Self-pay | Admitting: Internal Medicine

## 2017-12-20 ENCOUNTER — Other Ambulatory Visit: Payer: Self-pay | Admitting: Internal Medicine

## 2017-12-20 DIAGNOSIS — N401 Enlarged prostate with lower urinary tract symptoms: Secondary | ICD-10-CM

## 2018-01-21 ENCOUNTER — Encounter: Payer: Self-pay | Admitting: Internal Medicine

## 2018-01-25 ENCOUNTER — Other Ambulatory Visit: Payer: Self-pay | Admitting: Internal Medicine

## 2018-01-25 DIAGNOSIS — K219 Gastro-esophageal reflux disease without esophagitis: Secondary | ICD-10-CM

## 2018-02-04 ENCOUNTER — Other Ambulatory Visit: Payer: Self-pay | Admitting: Internal Medicine

## 2018-02-12 ENCOUNTER — Encounter: Payer: Self-pay | Admitting: Internal Medicine

## 2018-02-19 ENCOUNTER — Encounter: Payer: Self-pay | Admitting: Internal Medicine

## 2018-02-19 ENCOUNTER — Ambulatory Visit (INDEPENDENT_AMBULATORY_CARE_PROVIDER_SITE_OTHER): Payer: Medicare Other | Admitting: Internal Medicine

## 2018-02-19 VITALS — BP 126/76 | HR 57 | Temp 97.7°F | Ht 74.5 in | Wt 222.6 lb

## 2018-02-19 DIAGNOSIS — E782 Mixed hyperlipidemia: Secondary | ICD-10-CM | POA: Diagnosis not present

## 2018-02-19 DIAGNOSIS — Z136 Encounter for screening for cardiovascular disorders: Secondary | ICD-10-CM

## 2018-02-19 DIAGNOSIS — R7309 Other abnormal glucose: Secondary | ICD-10-CM | POA: Diagnosis not present

## 2018-02-19 DIAGNOSIS — Z0000 Encounter for general adult medical examination without abnormal findings: Secondary | ICD-10-CM | POA: Diagnosis not present

## 2018-02-19 DIAGNOSIS — I10 Essential (primary) hypertension: Secondary | ICD-10-CM | POA: Diagnosis not present

## 2018-02-19 DIAGNOSIS — N401 Enlarged prostate with lower urinary tract symptoms: Secondary | ICD-10-CM

## 2018-02-19 DIAGNOSIS — E559 Vitamin D deficiency, unspecified: Secondary | ICD-10-CM | POA: Diagnosis not present

## 2018-02-19 DIAGNOSIS — Z8249 Family history of ischemic heart disease and other diseases of the circulatory system: Secondary | ICD-10-CM

## 2018-02-19 DIAGNOSIS — Z0001 Encounter for general adult medical examination with abnormal findings: Secondary | ICD-10-CM

## 2018-02-19 DIAGNOSIS — Z125 Encounter for screening for malignant neoplasm of prostate: Secondary | ICD-10-CM | POA: Diagnosis not present

## 2018-02-19 DIAGNOSIS — Z79899 Other long term (current) drug therapy: Secondary | ICD-10-CM

## 2018-02-19 DIAGNOSIS — Z1211 Encounter for screening for malignant neoplasm of colon: Secondary | ICD-10-CM

## 2018-02-19 DIAGNOSIS — N138 Other obstructive and reflux uropathy: Secondary | ICD-10-CM

## 2018-02-19 DIAGNOSIS — K219 Gastro-esophageal reflux disease without esophagitis: Secondary | ICD-10-CM

## 2018-02-19 DIAGNOSIS — Z1212 Encounter for screening for malignant neoplasm of rectum: Secondary | ICD-10-CM

## 2018-02-19 DIAGNOSIS — Z87891 Personal history of nicotine dependence: Secondary | ICD-10-CM

## 2018-02-19 NOTE — Patient Instructions (Signed)

## 2018-02-19 NOTE — Progress Notes (Signed)
[facility]   Unk Pinto, M.D.     Uvaldo Bristle. Silverio Lay, P.A.-C Liane Comber, [place]                [address] [place], [place] [address] Telephone [phone] Telefax [phone] Annual  Screening/Preventative Visit  & Comprehensive Evaluation & Examination     This very nice 77 y.o. MWM presents for a Screening /Preventative Visit & comprehensive evaluation and management of multiple medical co-morbidities.  Patient has been followed for HTN, HLD, Prediabetes and Vitamin D Deficiency. Thru better food choices patient has dropped 333 over the last year - from 255.5# (BMI 31)  down to 222.5# (BMI 28).      HTN predates since 68. Patient's BP has been controlled at home.  Today's BP is at goal -126/76. Patient denies any cardiac symptoms as chest pain, palpitations, shortness of breath, dizziness or ankle swelling.     Patient's hyperlipidemia is controlled with diet and medications. Patient denies myalgias or other medication SE's. Current lipids are  at goal:  Lab Results  Component Value Date   CHOL 142 02/19/2018   HDL 51 02/19/2018   LDLCALC 74 02/19/2018   TRIG 86 02/19/2018   CHOLHDL 2.8 02/19/2018      Patient has prediabetes (A1c 5.7% in 2011 and in Dec 2016) and patient denies reactive hypoglycemic symptoms, visual blurring, diabetic polys or paresthesias. Last A1c was Normal & at goal: Lab Results  Component Value Date   HGBA1C 5.4 02/19/2018       Finally, patient has history of Vitamin D Deficiency ("36" in 2008)  and current vitamin D is at goal: Lab Results  Component Value Date   VD25OH 86 02/19/2018   Current Outpatient Medications on File Prior to Visit  Medication Sig  . aspirin 81 MG EC tablet Take 81 mg by mouth daily.    . Cholecalciferol (VITAMIN D3) 10000 UNITS capsule Take 10,000 Units by mouth every other day.   . enalapril (VASOTEC) 20 MG tablet TAKE 1  TABLET BY MOUTH EVERY DAY FOR BLOOD PRESSURE  . hydrochlorothiazide (HYDRODIURIL) 12.5 MG tablet TAKE 1 TABLET BY MOUTH EVERY MORNING FOR BLOOD PRESSURE  . latanoprost (XALATAN) 0.005 % ophthalmic solution Place 1 drop into both eyes at bedtime.  . magnesium oxide (MAG-OX) 400 MG tablet Take 400 mg by mouth 2 (two) times daily.   . Multiple Vitamin (MULTIVITAMIN) tablet Take 1 tablet by mouth daily.    . Omega-3 Fatty Acids (FISH OIL) 1200 MG CAPS Take 1 capsule by mouth 2 (two) times daily.   . pravastatin (PRAVACHOL) 40 MG tablet TAKE 1 TABLET BY MOUTH EVERY NIGHT AT BEDTIME FOR CHOLESTEROL  . tamsulosin (FLOMAX) 0.4 MG CAPS capsule TAKE ONE CAPSULE BY MOUTH EVERY NIGHT AT BEDTIME  . vitamin C (ASCORBIC ACID) 500 MG tablet Take 500 mg by mouth daily.     No current facility-administered medications on file prior to visit.    Allergies  Allergen Reactions  . Viagra [Sildenafil Citrate]     Headache   Past Medical History:  Diagnosis Date  . BPH (benign prostatic hypertrophy)   . Colon polyps   . Elevated hemoglobin A1c   . Glaucoma   . Hypertension   . Vitamin D deficiency    Health Maintenance  Topic Date Due  . INFLUENZA VACCINE  02/28/2018  .  TETANUS/TDAP  08/10/2019  . PNA vac Low Risk Adult  Completed   Immunization History  Administered Date(s) Administered  . Influenza-Unspecified 04/30/2014  . Pneumococcal Conjugate-13 06/08/2014  . Pneumococcal Polysaccharide-23 08/09/2009  . Td 08/09/2009   Last Colon -  Past Surgical History:  Procedure Laterality Date  . CARDIAC CATHETERIZATION  1999  . INGUINAL HERNIA REPAIR    . LAPAROSCOPIC APPENDECTOMY     Family History  Problem Relation Age of Onset  . Hypertension Mother   . Alzheimer's disease Mother   . Hyperlipidemia Father   . Hypertension Father   . Cancer Father        esophagus  . Lymphoma Father   . Hypertension Sister   . Hyperlipidemia Sister    Social History   Socioeconomic History  .  Marital status: Married    Spouse name: Not on file  . Number of children: Not on file  . Years of education: Not on file  . Highest education level: Not on file  Occupational History  . Retired [employer]  Tobacco Use  . Smoking status: Former Smoker    Last attempt to quit: 07/31/2001    Years since quitting: 16.5  . Smokeless tobacco: Never Used  Substance and Sexual Activity  . Alcohol use: No  . Drug use: No  . Sexual activity: Not on file    ROS Constitutional: Denies fever, chills, weight loss/gain, headaches, insomnia,  night sweats or change in appetite. Does c/o fatigue. Eyes: Denies redness, blurred vision, diplopia, discharge, itchy or watery eyes.  ENT: Denies discharge, congestion, post nasal drip, epistaxis, sore throat, earache, hearing loss, dental pain, Tinnitus, Vertigo, Sinus pain or snoring.  Cardio: Denies chest pain, palpitations, irregular heartbeat, syncope, dyspnea, diaphoresis, orthopnea, PND, claudication or edema Respiratory: denies cough, dyspnea, DOE, pleurisy, hoarseness, laryngitis or wheezing.  Gastrointestinal: Denies dysphagia, heartburn, reflux, water brash, pain, cramps, nausea, vomiting, bloating, diarrhea, constipation, hematemesis, melena, hematochezia, jaundice or hemorrhoids Genitourinary: Denies dysuria, frequency, urgency, nocturia, hesitancy, discharge, hematuria or flank pain Musculoskeletal: Denies arthralgia, myalgia, stiffness, Jt. Swelling, pain, limp or strain/sprain. Denies Falls. Skin: Denies puritis, rash, hives, warts, acne, eczema or change in skin lesion Neuro: No weakness, tremor, incoordination, spasms, paresthesia or pain Psychiatric: Denies confusion, memory loss or sensory loss. Denies Depression. Endocrine: Denies change in weight, skin, hair change, nocturia, and paresthesia, diabetic polys, visual blurring or hyper / hypo glycemic episodes.  Heme/Lymph: No excessive bleeding, bruising or enlarged lymph  nodes.  Physical Exam  BP 126/76   P  57   T 97.7 F    Ht 6' 2.5"  Wt 222 lb 9.6 oz   BMI 28.20   Postural Sitting    BP 121/67   P 67    &     Standing   BP 113/76   P 71   General Appearance: Well nourished and well groomed and in no apparent distress.  Eyes: PERRLA, EOMs, conjunctiva no swelling or erythema, normal fundi and vessels. Sinuses: No frontal/maxillary tenderness ENT/Mouth: EACs patent / TMs  nl. Nares clear without erythema, swelling, mucoid exudates. Oral hygiene is good. No erythema, swelling, or exudate. Tongue normal, non-obstructing. Tonsils not swollen or erythematous. Hearing normal.  Neck: Supple, thyroid not palpable. No bruits, nodes or JVD. Respiratory: Respiratory effort normal.  BS equal and clear bilateral without rales, rhonci, wheezing or stridor. Cardio: Heart sounds are normal with regular rate and rhythm and no murmurs, rubs or gallops. Peripheral pulses are normal  and equal bilaterally without edema. No aortic or femoral bruits. Chest: symmetric with normal excursions and percussion.  Abdomen: Soft, with Nl bowel sounds. Nontender, no guarding, rebound, hernias, masses, or organomegaly.  Lymphatics: Non tender without lymphadenopathy.  Genitourinary: No hernias.Testes nl. DRE - prostate nl for age - smooth & firm w/o nodules. Musculoskeletal: Full ROM all peripheral extremities, joint stability, 5/5 strength, and normal gait. Skin: Warm and dry without rashes, lesions, cyanosis, clubbing or  ecchymosis.  Neuro: Cranial nerves intact, reflexes equal bilaterally. Normal muscle tone, no cerebellar symptoms. Sensation intact.  Pysch: Alert and oriented X 3 with normal affect, insight and judgment appropriate.   Assessment and Plan  1. Annual Preventative/Screening Exam   2. Essential hypertension  - discussed tapering Enalapril 20 mg to 1/2 tablet and HCTZ - only prn ankle edema  - EKG 12-Lead - [place], RETROPERITNL ABD,  LTD - Urinalysis, Routine w  reflex microscopic - Microalbumin / creatinine urine ratio - CBC with Differential/Platelet - COMPLETE METABOLIC PANEL WITH GFR - Magnesium - TSH  3. Hyperlipidemia, mixed  - EKG 12-Lead - [place], RETROPERITNL ABD,  LTD - Lipid panel - TSH  4. Abnormal glucose  - EKG 12-Lead - [place], RETROPERITNL ABD,  LTD - Hemoglobin A1c - Insulin, random  5. Vitamin D deficiency  - VITAMIN D 25 Hydroxy  6. Prediabetes  - EKG 12-Lead - [place], RETROPERITNL ABD,  LTD - Hemoglobin A1c - Insulin, random  7. Gastroesophageal reflux disease without esophagitis  - CBC with Differential/Platelet  8. Screening for colorectal cancer  - POC Hemoccult Bld/Stl   9. BPH with obstruction/lower urinary tract symptoms  - PSA  10. Prostate cancer screening  - PSA  11. Screening for ischemic heart disease  - EKG 12-Lead  12. Former smoker  - EKG 12-Lead - [place], RETROPERITNL ABD,  LTD  13. FH: hypertension  - EKG 12-Lead - [place], RETROPERITNL ABD,  LTD  14. Screening for AAA (aortic abdominal aneurysm)  - [place], RETROPERITNL ABD,  LTD  15. Medication management  - Urinalysis, Routine w reflex microscopic - Microalbumin / creatinine urine ratio - CBC with Differential/Platelet - COMPLETE METABOLIC PANEL WITH GFR - Magnesium - Lipid panel - TSH - Hemoglobin A1c - Insulin, random - VITAMIN D 25 Hydroxyl       Patient was counseled in prudent diet, weight control to achieve/maintain BMI less than 25, BP monitoring, regular exercise and medications as discussed.  Discussed med effects and SE's. Routine screening labs and tests as requested with regular follow-up as recommended. Over 40 minutes of exam, counseling, chart review and high complex critical decision making was performed

## 2018-02-20 LAB — CBC WITH DIFFERENTIAL/PLATELET
Basophils Absolute: 38 cells/uL (ref 0–200)
Basophils Relative: 0.6 %
EOS PCT: 1.9 %
Eosinophils Absolute: 120 cells/uL (ref 15–500)
HCT: 43.7 % (ref 38.5–50.0)
Hemoglobin: 15 g/dL (ref 13.2–17.1)
Lymphs Abs: 2218 cells/uL (ref 850–3900)
MCH: 33.3 pg — ABNORMAL HIGH (ref 27.0–33.0)
MCHC: 34.3 g/dL (ref 32.0–36.0)
MCV: 97.1 fL (ref 80.0–100.0)
MPV: 10.6 fL (ref 7.5–12.5)
Monocytes Relative: 8.6 %
NEUTROS ABS: 3383 {cells}/uL (ref 1500–7800)
Neutrophils Relative %: 53.7 %
Platelets: 153 10*3/uL (ref 140–400)
RBC: 4.5 10*6/uL (ref 4.20–5.80)
RDW: 12.2 % (ref 11.0–15.0)
Total Lymphocyte: 35.2 %
WBC mixed population: 542 cells/uL (ref 200–950)
WBC: 6.3 10*3/uL (ref 3.8–10.8)

## 2018-02-20 LAB — COMPLETE METABOLIC PANEL WITH GFR
AG RATIO: 2.1 (calc) (ref 1.0–2.5)
ALKALINE PHOSPHATASE (APISO): 66 U/L (ref 40–115)
ALT: 25 U/L (ref 9–46)
AST: 21 U/L (ref 10–35)
Albumin: 4.5 g/dL (ref 3.6–5.1)
BILIRUBIN TOTAL: 0.6 mg/dL (ref 0.2–1.2)
BUN: 19 mg/dL (ref 7–25)
CALCIUM: 9.6 mg/dL (ref 8.6–10.3)
CO2: 30 mmol/L (ref 20–32)
Chloride: 103 mmol/L (ref 98–110)
Creat: 0.99 mg/dL (ref 0.70–1.18)
GFR, EST NON AFRICAN AMERICAN: 74 mL/min/{1.73_m2} (ref >=60)
GFR, Est African American: 85 mL/min/{1.73_m2} (ref >=60)
Globulin: 2.1 g/dL (calc) (ref 1.9–3.7)
Glucose, Bld: 96 mg/dL (ref 65–99)
POTASSIUM: 4.3 mmol/L (ref 3.5–5.3)
SODIUM: 142 mmol/L (ref 135–146)
Total Protein: 6.6 g/dL (ref 6.1–8.1)

## 2018-02-20 LAB — HEMOGLOBIN A1C
Hgb A1c MFr Bld: 5.4 % of total Hgb (ref <5.7)
MEAN PLASMA GLUCOSE: 108 (calc)
eAG (mmol/L): 6 (calc)

## 2018-02-20 LAB — LIPID PANEL
CHOL/HDL RATIO: 2.8 (calc) (ref <5.0)
CHOLESTEROL: 142 mg/dL (ref <200)
HDL: 51 mg/dL (ref >40)
LDL Cholesterol (Calc): 74 mg/dL (calc)
Non-HDL Cholesterol (Calc): 91 mg/dL (calc) (ref <130)
Triglycerides: 86 mg/dL (ref <150)

## 2018-02-20 LAB — VITAMIN D 25 HYDROXY (VIT D DEFICIENCY, FRACTURES): Vit D, 25-Hydroxy: 86 ng/mL (ref 30–100)

## 2018-02-20 LAB — URINALYSIS, ROUTINE W REFLEX MICROSCOPIC
BILIRUBIN URINE: NEGATIVE
Glucose, UA: NEGATIVE
HGB URINE DIPSTICK: NEGATIVE
KETONES UR: NEGATIVE
Leukocytes, UA: NEGATIVE
Nitrite: NEGATIVE
PROTEIN: NEGATIVE
Specific Gravity, Urine: 1.015 (ref 1.001–1.03)
pH: 7 (ref 5.0–8.0)

## 2018-02-20 LAB — PSA: PSA: 0.7 ng/mL (ref <=4.0)

## 2018-02-20 LAB — MAGNESIUM: Magnesium: 2.2 mg/dL (ref 1.5–2.5)

## 2018-02-20 LAB — MICROALBUMIN / CREATININE URINE RATIO
CREATININE, URINE: 57 mg/dL (ref 20–320)
Microalb, Ur: <0.2 mg/dL

## 2018-02-20 LAB — INSULIN, RANDOM: Insulin: 27.6 u[IU]/mL — ABNORMAL HIGH (ref 2.0–19.6)

## 2018-02-20 LAB — TSH: TSH: 2.39 mIU/L (ref 0.40–4.50)

## 2018-02-24 ENCOUNTER — Encounter: Payer: Self-pay | Admitting: Internal Medicine

## 2018-03-02 ENCOUNTER — Other Ambulatory Visit: Payer: Self-pay | Admitting: Internal Medicine

## 2018-03-08 ENCOUNTER — Other Ambulatory Visit: Payer: Self-pay

## 2018-03-08 DIAGNOSIS — Z1212 Encounter for screening for malignant neoplasm of rectum: Principal | ICD-10-CM

## 2018-03-08 DIAGNOSIS — Z1211 Encounter for screening for malignant neoplasm of colon: Secondary | ICD-10-CM

## 2018-03-08 LAB — POC HEMOCCULT BLD/STL (HOME/3-CARD/SCREEN)
Card #2 Fecal Occult Blod, POC: NEGATIVE
Card #3 Fecal Occult Blood, POC: NEGATIVE
Fecal Occult Blood, POC: NEGATIVE

## 2018-03-17 ENCOUNTER — Other Ambulatory Visit: Payer: Self-pay | Admitting: Internal Medicine

## 2018-03-17 DIAGNOSIS — N401 Enlarged prostate with lower urinary tract symptoms: Secondary | ICD-10-CM

## 2018-03-22 DIAGNOSIS — H402222 Chronic angle-closure glaucoma, left eye, moderate stage: Secondary | ICD-10-CM | POA: Diagnosis not present

## 2018-03-22 DIAGNOSIS — H402211 Chronic angle-closure glaucoma, right eye, mild stage: Secondary | ICD-10-CM | POA: Diagnosis not present

## 2018-05-18 ENCOUNTER — Other Ambulatory Visit: Payer: Self-pay | Admitting: Internal Medicine

## 2018-05-31 NOTE — Progress Notes (Signed)
MEDICARE ANNUAL WELLNESS VISIT AND FOLLOW UP Assessment:   Medicare annual wellness visit Remote hx of smoking, not candidate for CT screening, last CXR was several years ago Discussed and CXR ordered  Essential hypertension - continue medications, DASH diet, exercise and monitor at home. Call if greater than 130/80.  -     CBC with Differential/Platelet -     CMP/GFR -     TSH  Hyperlipidemia -continue medications, check lipids, decrease fatty foods, increase activity.  -     Lipid panel  Glucose management Recent A1Cs at goal Discussed diet/exercise, weight management  Defer A1C; check CMP  Medication management -     Magnesium  Overweight Commended on recent weight loss  Recommended diet heavy in fruits and veggies and low in animal meats, cheeses, and dairy products, appropriate calorie intake Patient will work on adding in exercise - discussed guidelines/recommendations Discussed appropriate weight for height goal (220lb) Follow up at next visit  Depression, major, in remission ([place]) Continue meds  DDD (degenerative disc disease), lumbar Continue meds PRN, follow up ortho  Benign prostatic hyperplasia with urinary frequency Continue medications  Vitamin D deficiency At goal at recent check; continue to recommend supplementation for goal of 70-100 Defer vitamin D level  Glaucoma, unspecified glaucoma type, unspecified laterality Follow up eye doctor  Decreased hearing of both ears Flush ears at home, no Qtips Declines hearing test/hearing aids   Over 30 minutes of exam, counseling, chart review, and critical decision making was performed  Future Appointments  Date Time Provider [place]  09/10/2018  9:30 AM Unk Pinto, MD [facility] None  03/25/2019  3:00 PM Unk Pinto, MD [facility] None     Plan:   During the course of the visit the patient was educated and counseled about appropriate screening and preventive services including:     Pneumococcal vaccine   Influenza vaccine  Prevnar 13  Td vaccine  Screening electrocardiogram  Colorectal cancer screening  Diabetes screening  Glaucoma screening  Nutrition counseling    Subjective:  Cody Hamilton is a 77 y.o. male who presents for Medicare Annual Wellness Visit and 3 month follow up for HTN, hyperlipidemia, glucose management, and vitamin D Def.   BMI is Body mass index is 28 kg/m., he has been working on diet (cut out all sugar); he is active around the house but doesn't intentionally exercise Wt Readings from Last 3 Encounters:  06/03/18 221 lb (100.2 kg)  02/19/18 222 lb 9.6 oz (101 kg)  11/01/17 245 lb (111.1 kg)   His blood pressure has not been checking at home, today their BP is BP: 124/82 Improved with weight loss, has cut enalapril in 1/2  He does not workout. He denies chest pain, shortness of breath, dizziness.   He is on cholesterol medication and denies myalgias. His cholesterol is at goal. The cholesterol last visit was:   Lab Results  Component Value Date   CHOL 142 02/19/2018   HDL 51 02/19/2018   LDLCALC 74 02/19/2018   TRIG 86 02/19/2018   CHOLHDL 2.8 02/19/2018    He has been working on diet and exercise for glucose management, and denies paresthesia of the feet, polydipsia, polyuria and visual disturbances. Last A1C in the office was:  Lab Results  Component Value Date   HGBA1C 5.4 02/19/2018   Patient is on Vitamin D supplement.   Lab Results  Component Value Date   VD25OH 86 02/19/2018      Lab Results  Component  Value Date   [facility] 74 02/19/2018      Medication Review: Current Outpatient Medications on File Prior to Visit  Medication Sig Dispense Refill  . aspirin 81 MG EC tablet Take 81 mg by mouth daily.      . Cholecalciferol (VITAMIN D3) 10000 UNITS capsule Take 10,000 Units by mouth every other day.     . enalapril (VASOTEC) 20 MG tablet TAKE 1 TABLET BY MOUTH EVERY DAY FOR BLOOD PRESSURE  (Patient taking differently: 10 mg. ) 90 tablet 3  . hydrochlorothiazide (HYDRODIURIL) 12.5 MG tablet TAKE 1 TABLET BY MOUTH EVERY MORNING FOR BLOOD PRESSURE 90 tablet 1  . latanoprost (XALATAN) 0.005 % ophthalmic solution Place 1 drop into both eyes at bedtime.    . magnesium oxide (MAG-OX) 400 MG tablet Take 400 mg by mouth 2 (two) times daily.     . Multiple Vitamin (MULTIVITAMIN) tablet Take 1 tablet by mouth daily.      . Omega-3 Fatty Acids (FISH OIL) 1200 MG CAPS Take 1 capsule by mouth 2 (two) times daily.     . pravastatin (PRAVACHOL) 40 MG tablet TAKE 1 TABLET BY MOUTH EVERY NIGHT AT BEDTIME FOR CHOLESTEROL 90 tablet 1  . tamsulosin (FLOMAX) 0.4 MG CAPS capsule TAKE ONE CAPSULE BY MOUTH EVERY NIGHT AT BEDTIME 90 capsule 0  . vitamin C (ASCORBIC ACID) 500 MG tablet Take 500 mg by mouth daily.       No current facility-administered medications on file prior to visit.     Allergies: Allergies  Allergen Reactions  . Viagra [Sildenafil Citrate]     Headache    Current Problems (verified) has Hyperlipidemia; Hypertension; Other abnormal glucose; Vitamin D deficiency; Benign prostatic hyperplasia; Glaucoma; DDD (degenerative disc disease), lumbar; Medication management; Overweight (BMI 25.0-29.9); Depression, major, in remission ([place]); Encounter for Medicare annual wellness exam; and Decreased hearing on their problem list.  Screening Tests Immunization History  Administered Date(s) Administered  . Influenza-Unspecified 04/30/2014  . Pneumococcal Conjugate-13 06/08/2014  . Pneumococcal Polysaccharide-23 08/09/2009  . Td 08/09/2009   Preventative care: Last colonoscopy: 08/2017, Dr. Oletta Lamas, no more due to age, hemoccult annually Echo 2012 CT head 10/2013 CT cervical 10/2013 Lumbar Xray 2013 CXR last in 2011  Prior vaccinations: TD or Tdap: 2011  Influenza: 2017, declines Pneumococcal: 2011 Prevnar13: 2015 Shingles/Zostavax: declines  Names of Other  Physician/Practitioners you currently use: 1. [facility] here for primary care 2. Dr. Kathlen Mody, eye doctor, last visit 2019 3. Dr. Beryle Lathe, dentist, last visit 2019  Patient Care Team: Unk Pinto, MD as PCP - General (Internal Medicine) Josue Hector, MD as Consulting Physician (Cardiology) Laurence Spates, MD as Consulting Physician (Gastroenterology)  Surgical: He  has a past surgical history that includes Inguinal hernia repair; Laparoscopic appendectomy; and Cardiac catheterization (1999). Family His family history includes Alzheimer's disease in his mother; Cancer in his father; Hyperlipidemia in his father and sister; Hypertension in his father, mother, and sister; Lymphoma in his father. Social history  He reports that he quit smoking about 16 years ago. He has never used smokeless tobacco. He reports that he does not drink alcohol or use drugs.  MEDICARE WELLNESS OBJECTIVES: Physical activity: Current Exercise Habits: The patient does not participate in regular exercise at present(Very active around house and with yardwork), Exercise limited by: None identified Cardiac risk factors: Cardiac Risk Factors include: male gender;hypertension;dyslipidemia;advanced age (>2men, >61 women);smoking/ tobacco exposure Depression/mood screen:   Depression screen [facility] 2/9 06/03/2018  Decreased Interest 0  Down, Depressed, Hopeless 0  PHQ - 2 Score 0    ADLs:  In your present state of health, do you have any difficulty performing the following activities: 06/03/2018 02/24/2018  Hearing? Y N  Comment declines hearing aids -  Vision? Y N  Comment can't drive at night -  Difficulty concentrating or making decisions? N N  Walking or climbing stairs? N N  Dressing or bathing? N N  Doing errands, shopping? N N  Some recent data might be hidden     Cognitive Testing  Alert? Yes  Normal Appearance?Yes  Oriented to person? Yes  Place? Yes   Time?  Yes  Recall of three objects?  Yes  Can perform simple calculations? Yes  Displays appropriate judgment?Yes  Can read the correct time from a watch face?Yes  EOL planning: Does Patient Have a Medical Advance Directive?: Yes Type of Advance Directive: Healthcare Power of Attorney, Living will Does patient want to make changes to medical advance directive?: No - Patient declined Copy of [place] in Chart?: No - copy requested   Objective:   Today's Vitals   06/03/18 0938  BP: 124/82  Pulse: (!) 53  Temp: (!) 97.3 F (36.3 C)  SpO2: 98%  Weight: 221 lb (100.2 kg)  Height: 6' 2.5" (1.892 m)   Body mass index is 28 kg/m.  General appearance: alert, no distress, WD/WN, male HEENT: normocephalic, sclerae anicteric, , bilateral with cerumen impaction, nares patent, no discharge or erythema, pharynx normal Oral cavity: MMM, no lesions Neck: supple, no lymphadenopathy, no thyromegaly, no masses Heart: RRR, normal S1, S2, no murmurs Lungs: CTA bilaterally, no wheezes, rhonchi, or rales Abdomen: +bs, soft, non tender, non distended, no masses, no hepatomegaly, no splenomegaly Musculoskeletal: nontender, no swelling, no obvious deformity Extremities: no edema, no cyanosis, no clubbing Pulses: 2+ symmetric, upper and lower extremities, normal cap refill Neurological: alert, oriented x 3, CN2-12 intact, strength normal upper extremities and lower extremities, sensation normal throughout, DTRs 2+ throughout, no cerebellar signs, gait normal Psychiatric: normal affect, behavior normal, pleasant   Medicare Attestation I have personally reviewed: The patient's medical and social history Their use of alcohol, tobacco or illicit drugs Their current medications and supplements The patient's functional ability including ADLs,fall risks, home safety risks, cognitive, and hearing and visual impairment Diet and physical activities Evidence for depression or mood  disorders  The patient's weight, height, BMI, and visual acuity have been recorded in the chart.  I have made referrals, counseling, and provided education to the patient based on review of the above and I have provided the patient with a written personalized care plan for preventive services.     Izora Ribas, NP   06/03/2018

## 2018-06-03 ENCOUNTER — Ambulatory Visit: Payer: Self-pay | Admitting: Adult Health

## 2018-06-03 ENCOUNTER — Encounter: Payer: Self-pay | Admitting: Adult Health

## 2018-06-03 ENCOUNTER — Ambulatory Visit (INDEPENDENT_AMBULATORY_CARE_PROVIDER_SITE_OTHER): Payer: Medicare Other | Admitting: Adult Health

## 2018-06-03 VITALS — BP 124/82 | HR 53 | Temp 97.3°F | Ht 74.5 in | Wt 221.0 lb

## 2018-06-03 DIAGNOSIS — Z0001 Encounter for general adult medical examination with abnormal findings: Secondary | ICD-10-CM | POA: Diagnosis not present

## 2018-06-03 DIAGNOSIS — R6889 Other general symptoms and signs: Secondary | ICD-10-CM | POA: Diagnosis not present

## 2018-06-03 DIAGNOSIS — I10 Essential (primary) hypertension: Secondary | ICD-10-CM | POA: Diagnosis not present

## 2018-06-03 DIAGNOSIS — R7309 Other abnormal glucose: Secondary | ICD-10-CM | POA: Diagnosis not present

## 2018-06-03 DIAGNOSIS — R350 Frequency of micturition: Secondary | ICD-10-CM

## 2018-06-03 DIAGNOSIS — E559 Vitamin D deficiency, unspecified: Secondary | ICD-10-CM | POA: Diagnosis not present

## 2018-06-03 DIAGNOSIS — F325 Major depressive disorder, single episode, in full remission: Secondary | ICD-10-CM

## 2018-06-03 DIAGNOSIS — Z87891 Personal history of nicotine dependence: Secondary | ICD-10-CM

## 2018-06-03 DIAGNOSIS — Z0000 Encounter for general adult medical examination without abnormal findings: Secondary | ICD-10-CM

## 2018-06-03 DIAGNOSIS — Z79899 Other long term (current) drug therapy: Secondary | ICD-10-CM | POA: Diagnosis not present

## 2018-06-03 DIAGNOSIS — M5136 Other intervertebral disc degeneration, lumbar region: Secondary | ICD-10-CM | POA: Diagnosis not present

## 2018-06-03 DIAGNOSIS — H9193 Unspecified hearing loss, bilateral: Secondary | ICD-10-CM

## 2018-06-03 DIAGNOSIS — E782 Mixed hyperlipidemia: Secondary | ICD-10-CM

## 2018-06-03 DIAGNOSIS — N401 Enlarged prostate with lower urinary tract symptoms: Secondary | ICD-10-CM

## 2018-06-03 DIAGNOSIS — H409 Unspecified glaucoma: Secondary | ICD-10-CM

## 2018-06-03 DIAGNOSIS — E663 Overweight: Secondary | ICD-10-CM

## 2018-06-03 NOTE — Patient Instructions (Addendum)
  Cody Hamilton , Thank you for taking time to come for your Medicare Wellness Visit. I appreciate your ongoing commitment to your health goals. Please review the following plan we discussed and let me know if I can assist you in the future.   These are the goals we discussed: Goals    . Exercise 150 min/wk Moderate Activity    . Weight (lb) < 215 lb (97.5 kg)       This is a list of the screening recommended for you and due dates:  Health Maintenance  Topic Date Due  . Flu Shot  06/04/2019*  . Tetanus Vaccine  08/10/2019  . Pneumonia vaccines  Completed  *Topic was postponed. The date shown is not the original due date.     Know what a healthy weight is for you (roughly BMI <25) and aim to maintain this  Aim for 7+ servings of fruits and vegetables daily  65-80+ fluid ounces of water or unsweet tea for healthy kidneys  Limit to max 1 drink of alcohol per day; avoid smoking/tobacco  Limit animal fats in diet for cholesterol and heart health - choose grass fed whenever available  Avoid highly processed foods, and foods high in saturated/trans fats  Aim for low stress - take time to unwind and care for your mental health  Aim for 150 min of moderate intensity exercise weekly for heart health, and weights twice weekly for bone health  Aim for 7-9 hours of sleep daily

## 2018-06-04 LAB — CBC WITH DIFFERENTIAL/PLATELET
BASOS ABS: 30 {cells}/uL (ref 0–200)
Basophils Relative: 0.5 %
Eosinophils Absolute: 100 cells/uL (ref 15–500)
Eosinophils Relative: 1.7 %
HEMATOCRIT: 45 % (ref 38.5–50.0)
Hemoglobin: 15.6 g/dL (ref 13.2–17.1)
LYMPHS ABS: 1923 {cells}/uL (ref 850–3900)
MCH: 32.9 pg (ref 27.0–33.0)
MCHC: 34.7 g/dL (ref 32.0–36.0)
MCV: 94.9 fL (ref 80.0–100.0)
MONOS PCT: 7.4 %
MPV: 10.9 fL (ref 7.5–12.5)
NEUTROS PCT: 57.8 %
Neutro Abs: 3410 cells/uL (ref 1500–7800)
PLATELETS: 157 10*3/uL (ref 140–400)
RBC: 4.74 10*6/uL (ref 4.20–5.80)
RDW: 12.3 % (ref 11.0–15.0)
TOTAL LYMPHOCYTE: 32.6 %
WBC mixed population: 437 cells/uL (ref 200–950)
WBC: 5.9 10*3/uL (ref 3.8–10.8)

## 2018-06-04 LAB — COMPLETE METABOLIC PANEL WITH GFR
AG Ratio: 2.1 (calc) (ref 1.0–2.5)
ALBUMIN MSPROF: 4.6 g/dL (ref 3.6–5.1)
ALKALINE PHOSPHATASE (APISO): 66 U/L (ref 40–115)
ALT: 23 U/L (ref 9–46)
AST: 23 U/L (ref 10–35)
BILIRUBIN TOTAL: 0.8 mg/dL (ref 0.2–1.2)
BUN: 19 mg/dL (ref 7–25)
CHLORIDE: 103 mmol/L (ref 98–110)
CO2: 28 mmol/L (ref 20–32)
Calcium: 9.5 mg/dL (ref 8.6–10.3)
Creat: 0.92 mg/dL (ref 0.70–1.18)
GFR, Est African American: 93 mL/min/{1.73_m2} (ref >=60)
GFR, Est Non African American: 81 mL/min/{1.73_m2} (ref >=60)
Globulin: 2.2 g/dL (calc) (ref 1.9–3.7)
Glucose, Bld: 84 mg/dL (ref 65–99)
POTASSIUM: 4.4 mmol/L (ref 3.5–5.3)
SODIUM: 140 mmol/L (ref 135–146)
Total Protein: 6.8 g/dL (ref 6.1–8.1)

## 2018-06-04 LAB — LIPID PANEL
Cholesterol: 171 mg/dL (ref <200)
HDL: 54 mg/dL (ref >40)
LDL Cholesterol (Calc): 90 mg/dL (calc)
Non-HDL Cholesterol (Calc): 117 mg/dL (calc) (ref <130)
TRIGLYCERIDES: 170 mg/dL — AB (ref <150)
Total CHOL/HDL Ratio: 3.2 (calc) (ref <5.0)

## 2018-06-04 LAB — MAGNESIUM: MAGNESIUM: 2 mg/dL (ref 1.5–2.5)

## 2018-06-04 LAB — TSH: TSH: 2.72 m[IU]/L (ref 0.40–4.50)

## 2018-06-14 ENCOUNTER — Other Ambulatory Visit: Payer: Self-pay | Admitting: Adult Health

## 2018-06-14 DIAGNOSIS — N401 Enlarged prostate with lower urinary tract symptoms: Secondary | ICD-10-CM

## 2018-08-04 ENCOUNTER — Other Ambulatory Visit: Payer: Self-pay | Admitting: Internal Medicine

## 2018-09-10 ENCOUNTER — Ambulatory Visit: Payer: Medicare Other | Admitting: Internal Medicine

## 2018-09-10 ENCOUNTER — Encounter: Payer: Self-pay | Admitting: Internal Medicine

## 2018-09-10 ENCOUNTER — Other Ambulatory Visit: Payer: Self-pay | Admitting: Adult Health

## 2018-09-10 VITALS — BP 122/70 | HR 72 | Temp 97.9°F | Ht 74.5 in | Wt 220.0 lb

## 2018-09-10 DIAGNOSIS — E782 Mixed hyperlipidemia: Secondary | ICD-10-CM

## 2018-09-10 DIAGNOSIS — N401 Enlarged prostate with lower urinary tract symptoms: Secondary | ICD-10-CM

## 2018-09-10 DIAGNOSIS — E559 Vitamin D deficiency, unspecified: Secondary | ICD-10-CM | POA: Diagnosis not present

## 2018-09-10 DIAGNOSIS — I10 Essential (primary) hypertension: Secondary | ICD-10-CM

## 2018-09-10 DIAGNOSIS — K219 Gastro-esophageal reflux disease without esophagitis: Secondary | ICD-10-CM | POA: Diagnosis not present

## 2018-09-10 DIAGNOSIS — R7309 Other abnormal glucose: Secondary | ICD-10-CM

## 2018-09-10 DIAGNOSIS — Z79899 Other long term (current) drug therapy: Secondary | ICD-10-CM

## 2018-09-10 NOTE — Progress Notes (Signed)
[name]  presents for 6 month follow up with HTN, HLD, Prediabetes and Vitamin D Deficiency. Thru better food choices in 2018 -2019, patient has dropped 35# - from 255.5# (BMI 31)  down to current 220# (BMI 27.9).      Patient is treated for HTN (1990)  & BP has been controlled at home. Today's BP is at goal - 122/70. Patient has had no complaints of any cardiac type chest pain, palpitations, dyspnea / orthopnea / PND, dizziness, claudication  or dependent edema.     Hyperlipidemia is controlled with diet & meds. Patient denies myalgias or other med SE's. Last Lipids were at goal albeit elevated Trig's:  Lab Results  Component Value Date   CHOL 171 06/03/2018   HDL 54 06/03/2018   LDLCALC 90 06/03/2018   TRIG 170 (H) 06/03/2018   CHOLHDL 3.2 06/03/2018      Also, the patient has history of PreDiabetes (A1c 5.7% / 2011 & Dec 2016) and has had no symptoms of reactive hypoglycemia, diabetic polys, paresthesias or visual blurring.  Last A1c was Normal & at goal: Lab Results  Component Value Date   HGBA1C 5.4 02/19/2018      Further, the patient also has history of Vitamin D Deficiency ("36" / 2008)  and supplements vitamin D without any suspected side-effects. Last vitamin D was at goal: Lab Results  Component Value Date   VD25OH 86 02/19/2018   Current Outpatient Medications on File Prior to Visit  Medication Sig  . aspirin 81 MG EC tablet Take 81 mg by mouth daily.    . Cholecalciferol (VITAMIN D3) 10000 UNITS capsule Take 10,000 Units by mouth every other day.   . enalapril (VASOTEC) 20 MG tablet TAKE 1 TABLET BY MOUTH EVERY DAY FOR BLOOD PRESSURE (Patient taking differently: 10 mg. )  . hydrochlorothiazide (HYDRODIURIL) 12.5 MG tablet TAKE 1 TABLET BY MOUTH EVERY MORNING FOR BLOOD PRESSURE  . latanoprost (XALATAN) 0.005 % ophthalmic solution Place 1 drop into both eyes at bedtime.  . magnesium oxide (MAG-OX) 400 MG tablet Take 400 mg by mouth 2 (two) times  daily.   . Multiple Vitamin (MULTIVITAMIN) tablet Take 1 tablet by mouth daily.    . Omega-3 Fatty Acids (FISH OIL) 1200 MG CAPS Take 1 capsule by mouth 2 (two) times daily.   . pravastatin (PRAVACHOL) 40 MG tablet TAKE 1 TABLET BY MOUTH EVERY NIGHT AT BEDTIME FOR CHOLESTEROL  . tamsulosin (FLOMAX) 0.4 MG CAPS capsule TAKE 1 CAPSULE BY MOUTH EVERY NIGHT AT BEDTIME  . vitamin C (ASCORBIC ACID) 500 MG tablet Take 500 mg by mouth daily.     No current facility-administered medications on file prior to visit.    Allergies  Allergen Reactions  . Viagra [Sildenafil Citrate]     Headache   PMHx:   Past Medical History:  Diagnosis Date  . BPH (benign prostatic hypertrophy)   . Colon polyps   . Elevated hemoglobin A1c   . Glaucoma   . Hypertension   . Vitamin D deficiency    Immunization History  Administered Date(s) Administered  . Influenza-Unspecified 04/30/2014  . Pneumococcal Conjugate-13 06/08/2014  . Pneumococcal Polysaccharide-23 08/09/2009  . Td 08/09/2009   Past Surgical History:  Procedure Laterality Date  . CARDIAC CATHETERIZATION  1999  . INGUINAL HERNIA REPAIR    . LAPAROSCOPIC APPENDECTOMY     FHx:    Reviewed / unchanged  SHx:    Reviewed / unchanged  Systems Review:  Constitutional: Denies fever, chills, wt changes, headaches, insomnia, fatigue, night sweats, change in appetite. Eyes: Denies redness, blurred vision, diplopia, discharge, itchy, watery eyes.  ENT: Denies discharge, congestion, post nasal drip, epistaxis, sore throat, earache, hearing loss, dental pain, tinnitus, vertigo, sinus pain, snoring.  CV: Denies chest pain, palpitations, irregular heartbeat, syncope, dyspnea, diaphoresis, orthopnea, PND, claudication or edema. Respiratory: denies cough, dyspnea, DOE, pleurisy, hoarseness, laryngitis, wheezing.  Gastrointestinal: Denies dysphagia, odynophagia, heartburn, reflux, water brash, abdominal pain or cramps, nausea, vomiting, bloating, diarrhea,  constipation, hematemesis, melena, hematochezia  or hemorrhoids. Genitourinary: Denies dysuria, frequency, urgency, nocturia, hesitancy, discharge, hematuria or flank pain. Musculoskeletal: Denies arthralgias, myalgias, stiffness, jt. swelling, pain, limping or strain/sprain.  Skin: Denies pruritus, rash, hives, warts, acne, eczema or change in skin lesion(s). Neuro: No weakness, tremor, incoordination, spasms, paresthesia or pain. Psychiatric: Denies confusion, memory loss or sensory loss. Endo: Denies change in weight, skin or hair change.  Heme/Lymph: No excessive bleeding, bruising or enlarged lymph nodes.  Physical Exam  BP 122/70   Pulse 72   Temp 97.9 F (36.6 C)   Ht 6' 2.5" (1.892 m)   Wt 220 lb (99.8 kg)   SpO2 97%   BMI 27.87 kg/m   Appears  well nourished, well groomed  and in no distress.  Eyes: PERRLA, EOMs, conjunctiva no swelling or erythema. Sinuses: No frontal/maxillary tenderness ENT/Mouth: EAC's clear, TM's nl w/o erythema, bulging. Nares clear w/o erythema, swelling, exudates. Oropharynx clear without erythema or exudates. Oral hygiene is good. Tongue normal, non obstructing. Hearing intact.  Neck: Supple. Thyroid not palpable. Car 2+/2+ without bruits, nodes or JVD. Chest: Respirations nl with BS clear & equal w/o rales, rhonchi, wheezing or stridor.  Cor: Heart sounds normal w/ regular rate and rhythm without sig. murmurs, gallops, clicks or rubs. Peripheral pulses normal and equal  without edema.  Abdomen: Soft & bowel sounds normal. Non-tender w/o guarding, rebound, hernias, masses or organomegaly.  Lymphatics: Unremarkable.  Musculoskeletal: Full ROM all peripheral extremities, joint stability, 5/5 strength and normal gait.  Skin: Warm, dry without exposed rashes, lesions or ecchymosis apparent.  Neuro: Cranial nerves intact, reflexes equal bilaterally. Sensory-motor testing grossly intact. Tendon reflexes grossly intact.  Pysch: Alert & oriented x 3.   Insight and judgement nl & appropriate. No ideations.  Assessment and Plan:  1. Essential hypertension  - Continue medication, monitor blood pressure at home.  - Continue DASH diet.  Reminder to go to the ER if any CP,  SOB, nausea, dizziness, severe HA, changes vision/speech.  - CBC with Differential/Platelet - COMPLETE METABOLIC PANEL WITH GFR - Magnesium - TSH  2. Hyp - Continue diet/meds, exercise,& lifestyle modifications.  - Continue monitor periodic cholesterol/liver & renal functions  erlipidemia, mixed  - Lipid panel - TSH  3. Abnormal glucose  - Continue diet, exercise  - Lifestyle modifications.  - Monitor appropriate labs.  - Hemoglobin A1c - Insulin, random  4. Vitamin D deficiency  - Continue supplementation.  - VITAMIN D 25 Hydroxyl  5. Prediabetes  - Hemoglobin A1c - Insulin, random  6. Gastroesophageal reflux disease   - CBC with Differential/Platelet  7. Medication management  - CBC with Differential/Platelet - COMPLETE METABOLIC PANEL WITH GFR - Magnesium - Lipid panel - TSH - Hemoglobin A1c - Insulin, random - VITAMIN D 25 Hydroxyl        Discussed  regular exercise, BP monitoring, weight control to achieve/maintain BMI less than 25 and discussed med and SE's. Recommended labs to  assess and monitor clinical status with further disposition pending results of labs. Over 30 minutes of exam, counseling, chart review was performed.

## 2018-09-10 NOTE — Patient Instructions (Signed)

## 2018-09-11 LAB — COMPLETE METABOLIC PANEL WITH GFR
AG Ratio: 2 (calc) (ref 1.0–2.5)
ALKALINE PHOSPHATASE (APISO): 60 U/L (ref 35–144)
ALT: 21 U/L (ref 9–46)
AST: 20 U/L (ref 10–35)
Albumin: 4.6 g/dL (ref 3.6–5.1)
BILIRUBIN TOTAL: 1 mg/dL (ref 0.2–1.2)
BUN: 18 mg/dL (ref 7–25)
CHLORIDE: 101 mmol/L (ref 98–110)
CO2: 31 mmol/L (ref 20–32)
Calcium: 9.7 mg/dL (ref 8.6–10.3)
Creat: 0.91 mg/dL (ref 0.70–1.18)
GFR, Est African American: 95 mL/min/{1.73_m2} (ref >=60)
GFR, Est Non African American: 82 mL/min/{1.73_m2} (ref >=60)
GLUCOSE: 84 mg/dL (ref 65–99)
Globulin: 2.3 g/dL (calc) (ref 1.9–3.7)
POTASSIUM: 4.3 mmol/L (ref 3.5–5.3)
Sodium: 139 mmol/L (ref 135–146)
TOTAL PROTEIN: 6.9 g/dL (ref 6.1–8.1)

## 2018-09-11 LAB — INSULIN, RANDOM: INSULIN: 9.4 u[IU]/mL (ref 2.0–19.6)

## 2018-09-11 LAB — CBC WITH DIFFERENTIAL/PLATELET
ABSOLUTE MONOCYTES: 470 {cells}/uL (ref 200–950)
Basophils Absolute: 29 cells/uL (ref 0–200)
Basophils Relative: 0.6 %
Eosinophils Absolute: 98 cells/uL (ref 15–500)
Eosinophils Relative: 2 %
HCT: 44.4 % (ref 38.5–50.0)
Hemoglobin: 15.6 g/dL (ref 13.2–17.1)
Lymphs Abs: 1686 cells/uL (ref 850–3900)
MCH: 33.9 pg — AB (ref 27.0–33.0)
MCHC: 35.1 g/dL (ref 32.0–36.0)
MCV: 96.5 fL (ref 80.0–100.0)
MONOS PCT: 9.6 %
MPV: 10.3 fL (ref 7.5–12.5)
NEUTROS PCT: 53.4 %
Neutro Abs: 2617 cells/uL (ref 1500–7800)
PLATELETS: 151 10*3/uL (ref 140–400)
RBC: 4.6 10*6/uL (ref 4.20–5.80)
RDW: 12.4 % (ref 11.0–15.0)
TOTAL LYMPHOCYTE: 34.4 %
WBC: 4.9 10*3/uL (ref 3.8–10.8)

## 2018-09-11 LAB — LIPID PANEL
Cholesterol: 169 mg/dL (ref <200)
HDL: 65 mg/dL (ref >=40)
LDL Cholesterol (Calc): 84 mg/dL (calc)
Non-HDL Cholesterol (Calc): 104 mg/dL (calc) (ref <130)
TRIGLYCERIDES: 106 mg/dL (ref <150)
Total CHOL/HDL Ratio: 2.6 (calc) (ref <5.0)

## 2018-09-11 LAB — TSH: TSH: 2.99 m[IU]/L (ref 0.40–4.50)

## 2018-09-11 LAB — HEMOGLOBIN A1C
EAG (MMOL/L): 5.7 (calc)
Hgb A1c MFr Bld: 5.2 % of total Hgb (ref <5.7)
Mean Plasma Glucose: 103 (calc)

## 2018-09-11 LAB — VITAMIN D 25 HYDROXY (VIT D DEFICIENCY, FRACTURES): Vit D, 25-Hydroxy: 74 ng/mL (ref 30–100)

## 2018-09-11 LAB — MAGNESIUM: MAGNESIUM: 2 mg/dL (ref 1.5–2.5)

## 2018-09-30 DIAGNOSIS — H402211 Chronic angle-closure glaucoma, right eye, mild stage: Secondary | ICD-10-CM | POA: Diagnosis not present

## 2018-09-30 DIAGNOSIS — H402222 Chronic angle-closure glaucoma, left eye, moderate stage: Secondary | ICD-10-CM | POA: Diagnosis not present

## 2018-09-30 DIAGNOSIS — H2513 Age-related nuclear cataract, bilateral: Secondary | ICD-10-CM | POA: Diagnosis not present

## 2018-09-30 DIAGNOSIS — H25013 Cortical age-related cataract, bilateral: Secondary | ICD-10-CM | POA: Diagnosis not present

## 2018-09-30 LAB — HM DIABETES EYE EXAM

## 2018-10-04 ENCOUNTER — Encounter: Payer: Self-pay | Admitting: *Deleted

## 2018-11-11 ENCOUNTER — Other Ambulatory Visit: Payer: Self-pay | Admitting: Internal Medicine

## 2018-12-06 ENCOUNTER — Other Ambulatory Visit: Payer: Self-pay | Admitting: Internal Medicine

## 2018-12-06 DIAGNOSIS — N401 Enlarged prostate with lower urinary tract symptoms: Secondary | ICD-10-CM

## 2018-12-06 MED ORDER — TAMSULOSIN HCL 0.4 MG PO CAPS: ORAL_CAPSULE | ORAL | 3 refills | Status: DC

## 2018-12-16 NOTE — Progress Notes (Signed)
MEDICARE ANNUAL WELLNESS VISIT AND FOLLOW UP Assessment:   Cody Hamilton was seen today for follow-up and medicare wellness.  Diagnoses and all orders for this visit:  Medicare annual wellness visit, subsequent Yearly  Essential hypertension Continue medication: HCTZ 12.5mg  & enalapril 20mg  Monitor blood pressure at home; call if consistently over 130/80 Continue DASH diet.   Reminder to go to the ER if any CP, SOB, nausea, dizziness, severe HA, changes vision/speech, left arm numbness and tingling and jaw pain.  Hyperlipidemia, mixed Continue medications: Pravachol 40mg  and fish oil Continue low cholesterol diet and exercise.  Check lipid panel.   Abnormal glucose Discussed dietary and exercise modifications Will check A1c and insulin  Vitamin D deficiency Taking supplementation Will check labs  Gastroesophageal reflux disease without esophagitis Doing well on current regiment Monitor diet for triggers  Benign localized prostatic hyperplasia with lower urinary tract symptoms (LUTS) No change in nocturia x1 Continue tamsulosin0.4mg  nightly  Decreased hearing of both ears Discussed audiology referral Does not want hearing aids Discussed evaluation at [facility]  Obesity (BMI 30.0-34.9) Discussed dietary and exercise modifications  Medication management Continued  Glaucoma, unspecified glaucoma type, unspecified laterality Continue Xalatan 1drop both eyes Due for eye exam for 2020   Patient and agrees with plan of care.  We will contact you in 1-3 business days with lab results drawn today.  Follow up in 3 month(s) for complete physical.  Call or return with new or worsening symptoms as discussed in appointment.  May contact office via phone [phone] or [place].   Over 30 minutes of exam, counseling, chart review, and critical decision making was performed  Future Appointments  Date Time Provider [place]  03/25/2019  3:00 PM Unk Pinto, MD  [facility] None  07/01/2019 11:15 AM Liane Comber, NP [facility] None  12/19/2019  9:30 AM Garnet Sierras, NP [facility] None     Plan:   During the course of the visit the patient was educated and counseled about appropriate screening and preventive services including:    Pneumococcal vaccine   Influenza vaccine  Prevnar 13  Td vaccine  Screening electrocardiogram  Colorectal cancer screening  Diabetes screening  Glaucoma screening  Nutrition counseling    Subjective:  Cody Hamilton is a 78 y.o. male who presents for Medicare Annual Wellness Visit and 3 month follow up for HTN, hyperlipidemia, BPH, Glaucoma, abnormal glucose, and vitamin D Def.   His blood pressure has not been checking  at home, today their BP is BP: 126/74 He does not workout. He denies chest pain, shortness of breath, dizziness.  He is on cholesterol medication and denies myalgias. His cholesterol is at goal. The cholesterol last visit was:   Lab Results  Component Value Date   CHOL 169 09/10/2018   HDL 65 09/10/2018   LDLCALC 84 09/10/2018   TRIG 106 09/10/2018   CHOLHDL 2.6 09/10/2018   He has been working on diet and exercise by mean of house and yard work for abnormal glucose, and denies foot ulcerations, paresthesia of the feet, polydipsia, polyuria and visual disturbances. Last A1C in the office was:  Lab Results  Component Value Date   HGBA1C 5.2 09/10/2018   Last GFR Lab Results  Component Value Date   GFRNONAA 82 09/10/2018     Lab Results  Component Value Date   GFRAA 95 09/10/2018   Patient is on Vitamin D supplement.   Lab Results  Component Value Date   VD25OH 74 09/10/2018  BPH Currently he is symptomatic and denies or endorses nocturia x 1 a night and denies urinary hesitancy, urinary frequency, incomplete voiding, double voiding, weak stream, perineal discomfort, dysuria, hematuria, abdominal pain, flank pain and testicular pain.  He is taking Tamulosin  (Flomax). He is not following with urology at this time and last results were in normal. Lab Results  Component Value Date   PSA 0.7 02/19/2018     Medication Review:   Current Outpatient Medications (Cardiovascular):  .  enalapril (VASOTEC) 20 MG tablet, TAKE 1 TABLET BY MOUTH EVERY DAY FOR BLOOD PRESSURE (Patient taking differently: 10 mg. ) .  hydrochlorothiazide (HYDRODIURIL) 12.5 MG tablet, TAKE 1 TABLET BY MOUTH EVERY MORNING FOR BLOOD PRESSURE .  pravastatin (PRAVACHOL) 40 MG tablet, TAKE 1 TABLET BY MOUTH EVERY NIGHT AT BEDTIME FOR CHOLESTEROL   Current Outpatient Medications (Analgesics):  .  aspirin 81 MG EC tablet, Take 81 mg by mouth daily.     Current Outpatient Medications (Other):  Marland Kitchen  Cholecalciferol (VITAMIN D3) 10000 UNITS capsule, Take 10,000 Units by mouth daily.  Marland Kitchen  latanoprost (XALATAN) 0.005 % ophthalmic solution, Place 1 drop into both eyes at bedtime. .  magnesium oxide (MAG-OX) 400 MG tablet, Take 400 mg by mouth 2 (two) times daily.  .  Multiple Vitamin (MULTIVITAMIN) tablet, Take 1 tablet by mouth daily.   .  Omega-3 Fatty Acids (FISH OIL) 1200 MG CAPS, Take 1 capsule by mouth 2 (two) times daily.  .  tamsulosin (FLOMAX) 0.4 MG CAPS capsule, Take 1 capsul at Bedtime for Prostate .  vitamin C (ASCORBIC ACID) 500 MG tablet, Take 500 mg by mouth daily.    Allergies: Allergies  Allergen Reactions  . Viagra [Sildenafil Citrate]     Headache    Current Problems (verified) has Hyperlipidemia; Hypertension; Other abnormal glucose; Vitamin D deficiency; Benign prostatic hyperplasia; Glaucoma; DDD (degenerative disc disease), lumbar; Medication management; Overweight (BMI 25.0-29.9); Depression, major, in remission ([place]); Encounter for Medicare annual wellness exam; and Decreased hearing on their problem list.  Screening Tests Immunization History  Administered Date(s) Administered  . Influenza-Unspecified 04/30/2014  . Pneumococcal Conjugate-13 06/08/2014   . Pneumococcal Polysaccharide-23 08/09/2009  . Td 08/09/2009    Preventative care: Last colonoscopy: 2019  Prior vaccinations: TD or Tdap: 2011  Influenza: Due for 2020 Pneumococcal: 2011 Prevnar13: 2013 Shingles/Zostavax: Declined   Names of Other Physician/Practitioners you currently use: 1. [facility] here for primary care 2. Eye Exam  Dr Kathlen Mody 2019 3. Dental Exam Dr Scheryl Darter 2019  Patient Care Team: Unk Pinto, MD as PCP - General (Internal Medicine) Josue Hector, MD as Consulting Physician (Cardiology) Laurence Spates, MD as Consulting Physician (Gastroenterology)  Surgical: He  has a past surgical history that includes Inguinal hernia repair; Laparoscopic appendectomy; and Cardiac catheterization (1999). Family His family history includes Alzheimer's disease in his mother; Cancer in his father; Hyperlipidemia in his father and sister; Hypertension in his father, mother, and sister; Lymphoma in his father. Social history  He reports that he quit smoking about 17 years ago. He has never used smokeless tobacco. He reports that he does not drink alcohol or use drugs.  MEDICARE WELLNESS OBJECTIVES: Physical activity: Current Exercise Habits: The patient does not participate in regular exercise at present(Does yard work around the home), Exercise limited by: None identified Cardiac risk factors: Cardiac Risk Factors include: advanced age (>7men, >61 women);dyslipidemia;hypertension;male gender Depression/mood screen:   Depression screen [facility] 2/9 12/17/2018  Decreased Interest 0  Down, Depressed, Hopeless 0  PHQ - 2 Score 0    ADLs:  In your present state of health, do you have any difficulty performing the following activities: 12/17/2018 09/10/2018  Hearing? Y [place]? N N  Comment - -  Difficulty concentrating or making decisions? N N  Walking or climbing stairs? N N  Dressing or bathing? N N  Doing  errands, shopping? N N  Preparing Food and eating ? N -  Using the Toilet? N -  In the past six months, have you accidently leaked urine? N -  Do you have problems with loss of bowel control? N -  Managing your Medications? N -  Managing your Finances? N -  Housekeeping or managing your Housekeeping? N -  Some recent data might be hidden     Cognitive Testing  Alert? Yes  Normal Appearance?Yes  Oriented to person? Yes  Place? Yes   Time? Yes  Recall of three objects?  Yes  Can perform simple calculations? Yes  Displays appropriate judgment?Yes  Can read the correct time from a watch face?Yes  EOL planning: Does Patient Have a Medical Advance Directive?: Yes Type of Advance Directive: Healthcare Power of Attorney, Living will Does patient want to make changes to medical advance directive?: Yes (Inpatient - patient defers changing a medical advance directive at this time - Information given) Copy of [place] in Chart?: No - copy requested   Objective:   Today's Vitals   12/17/18 0846  BP: 126/74  Pulse: 61  Temp: 97.7 F (36.5 C)  SpO2: 92%  Weight: 221 lb (100.2 kg)  Height: 6' 2.5" (1.892 m)   Body mass index is 28 kg/m.  General appearance: alert, no distress, WD/WN, male HEENT: normocephalic, sclerae anicteric, TMs pearly, nares patent, no discharge or erythema, pharynx normal Oral cavity: MMM, no lesions Neck: supple, no lymphadenopathy, no thyromegaly, no masses Heart: RRR, normal S1, S2, no murmurs Lungs: CTA bilaterally, no wheezes, rhonchi, or rales Abdomen: +bs, soft, non tender, non distended, no masses, no hepatomegaly, no splenomegaly Musculoskeletal: nontender, no swelling, no obvious deformity Extremities: no edema, no cyanosis, no clubbing Pulses: 2+ symmetric, upper and lower extremities, normal cap refill Neurological: alert, oriented x 3, CN2-12 intact, strength normal upper extremities and lower extremities, sensation normal  throughout, DTRs 2+ throughout, no cerebellar signs, gait normal Psychiatric: normal affect, behavior normal, pleasant   Medicare Attestation I have personally reviewed: The patient's medical and social history Their use of alcohol, tobacco or illicit drugs Their current medications and supplements The patient's functional ability including ADLs,fall risks, home safety risks, cognitive, and hearing and visual impairment Diet and physical activities Evidence for depression or mood disorders  The patient's weight, height, BMI, and visual acuity have been recorded in the chart.  I have made referrals, counseling, and provided education to the patient based on review of the above and I have provided the patient with a written personalized care plan for preventive services.    Garnet Sierras, NP [facility] 12/17/2018  9:31 AM

## 2018-12-17 ENCOUNTER — Ambulatory Visit: Payer: Self-pay | Admitting: Adult Health

## 2018-12-17 ENCOUNTER — Ambulatory Visit (INDEPENDENT_AMBULATORY_CARE_PROVIDER_SITE_OTHER): Payer: Medicare Other | Admitting: Adult Health Nurse Practitioner

## 2018-12-17 ENCOUNTER — Other Ambulatory Visit: Payer: Self-pay

## 2018-12-17 ENCOUNTER — Encounter: Payer: Self-pay | Admitting: Adult Health Nurse Practitioner

## 2018-12-17 ENCOUNTER — Ambulatory Visit: Payer: Self-pay | Admitting: Adult Health Nurse Practitioner

## 2018-12-17 VITALS — BP 126/74 | HR 61 | Temp 97.7°F | Ht 74.5 in | Wt 221.0 lb

## 2018-12-17 DIAGNOSIS — Z0001 Encounter for general adult medical examination with abnormal findings: Secondary | ICD-10-CM

## 2018-12-17 DIAGNOSIS — R6889 Other general symptoms and signs: Secondary | ICD-10-CM

## 2018-12-17 DIAGNOSIS — E782 Mixed hyperlipidemia: Secondary | ICD-10-CM | POA: Diagnosis not present

## 2018-12-17 DIAGNOSIS — R7309 Other abnormal glucose: Secondary | ICD-10-CM

## 2018-12-17 DIAGNOSIS — I10 Essential (primary) hypertension: Secondary | ICD-10-CM | POA: Diagnosis not present

## 2018-12-17 DIAGNOSIS — K219 Gastro-esophageal reflux disease without esophagitis: Secondary | ICD-10-CM | POA: Diagnosis not present

## 2018-12-17 DIAGNOSIS — E559 Vitamin D deficiency, unspecified: Secondary | ICD-10-CM

## 2018-12-17 DIAGNOSIS — Z0000 Encounter for general adult medical examination without abnormal findings: Secondary | ICD-10-CM

## 2018-12-17 DIAGNOSIS — H9193 Unspecified hearing loss, bilateral: Secondary | ICD-10-CM

## 2018-12-17 DIAGNOSIS — N401 Enlarged prostate with lower urinary tract symptoms: Secondary | ICD-10-CM

## 2018-12-17 DIAGNOSIS — Z79899 Other long term (current) drug therapy: Secondary | ICD-10-CM

## 2018-12-17 DIAGNOSIS — Z6828 Body mass index (BMI) 28.0-28.9, adult: Secondary | ICD-10-CM

## 2018-12-17 DIAGNOSIS — H409 Unspecified glaucoma: Secondary | ICD-10-CM

## 2018-12-17 NOTE — Patient Instructions (Signed)
Cody Hamilton , Thank you for taking time to come for your Medicare Wellness Visit. I appreciate your ongoing commitment to your health goals. Please review the following plan we discussed and let me know if I can assist you in the future.   These are the goals we discussed: Goals    . Exercise 150 min/wk Moderate Activity    . Weight (lb) < 215 lb (97.5 kg)       This is a list of the screening recommended for you and due dates:  Health Maintenance  Topic Date Due  . Flu Shot  03/01/2019  . Tetanus Vaccine  08/10/2019  . Pneumonia vaccines  Completed    Continue activity and increase amount of walking.  This will help with your overall health and continued weight loss.  Consider getting your hearing tested.  [employer] will schedule free audiology testing, even if you are not a member.   \We will contact you in 1-3 days with your lab results.  Coronavirus (COVID-19) Are you at risk?  Are you at risk for the Coronavirus (COVID-19)?  To be considered HIGH RISK for Coronavirus (COVID-19), you have to meet the following criteria:  . Traveled to [place], [place], [place], [place] or [place]; or in the [place] to [place], [place], [place]  . or [place]; and have fever, cough, and shortness of breath within the last 2 weeks of travel OR . Been in close contact with a person diagnosed with COVID-19 within the last 2 weeks and have  . fever, cough,and shortness of breath .  . IF YOU DO NOT MEET THESE CRITERIA, YOU ARE CONSIDERED LOW RISK FOR COVID-19.  What to do if you are HIGH RISK for COVID-19?  Marland Kitchen If you are having a medical emergency, call 911. . Seek medical care right away. Before you go to a doctor's office, urgent care or emergency department, .  call ahead and tell them about your recent travel, contact with someone diagnosed with COVID-19  .  and your symptoms.  . You should receive instructions from your physician's office regarding next steps of care.  . When  you arrive at healthcare provider, tell the healthcare staff immediately you have returned from  . visiting [place], [place], [place], [place] or [place]; or traveled in the [place] to [place], [place],  . [place] or [place] in the last two weeks or you have been in close contact with a person diagnosed with  . COVID-19 in the last 2 weeks.   . Tell the health care staff about your symptoms: fever, cough and shortness of breath. . After you have been seen by a medical provider, you will be either: o Tested for (COVID-19) and discharged home on quarantine except to seek medical care if  o symptoms worsen, and asked to  - Stay home and avoid contact with others until you get your results (4-5 days)  - Avoid travel on public transportation if possible (such as bus, train, or airplane) or o Sent to the Emergency Department by EMS for evaluation, COVID-19 testing  and  o possible admission depending on your condition and test results.  What to do if you are LOW RISK for COVID-19?  Reduce your risk of any infection by using the same precautions used for avoiding the common cold or flu:  Marland Kitchen Wash your hands often with soap and warm water for at least 20 seconds.  If soap and water are not  readily available,  . use an alcohol-based hand sanitizer with at least 60% alcohol.  . If coughing or sneezing, cover your mouth and nose by coughing or sneezing into the elbow areas of your shirt or coat, .  into a tissue or into your sleeve (not your hands). . Avoid shaking hands with others and consider head nods or verbal greetings only. . Avoid touching your eyes, nose, or mouth with unwashed hands.  . Avoid close contact with people who are sick. . Avoid places or events with large numbers of people in one location, like concerts or sporting events. . Carefully consider travel plans you have or are making. . If you are planning any travel outside or inside the [place], visit the CDC's Travelers' Health  webpage for the latest health notices. . If you have some symptoms but not all symptoms, continue to monitor at home and seek medical attention  . if your symptoms worsen. . If you are having a medical emergency, call 911. >>>>>>>>>>>>>>>>>>>>>>>>>>>>>>>>>>>>>>>>>>>>>>>>>>>>>>> We Do NOT Approve of  [facility] [place] Soliciting Our Patients  To Do Home Visits  & We Do NOT Approve of [employer] > > > > > > > > > > > > > > > > > > > > > > > > > > > > > > > > > > >  > > > >   Preventive Care for Adults  A healthy lifestyle and preventive care can promote health and wellness. Preventive health guidelines for men include the following key practices:  A routine yearly physical is a good way to check with your health care provider about your health and preventative screening. It is a chance to share any concerns and updates on your health and to receive a thorough exam.  Visit your dentist for a routine exam and preventative care every 6 months. Brush your teeth twice a day and floss once a day. Good oral hygiene prevents tooth decay and gum disease.  The frequency of eye exams is based on your age, health, family medical history, use of contact lenses, and other factors. Follow your health care provider's recommendations for frequency of eye exams.  Eat a healthy diet. Foods such as vegetables, fruits, whole grains, low-fat dairy products, and lean protein foods contain the nutrients you need without too many calories. Decrease your intake of foods high in solid fats, added sugars, and salt. Eat the right amount of calories for you. Get information about a proper diet from your health care provider, if necessary.  Regular physical exercise is one of the most important things you can do for your health. Most adults should get at least 150 minutes of moderate-intensity exercise (any activity that increases your heart rate and causes you to sweat) each week. In addition, most  adults need muscle-strengthening exercises on 2 or more days a week.  Maintain a healthy weight. The body mass index (BMI) is a screening tool to identify possible weight problems. It provides an estimate of body fat based on height and weight. Your health care provider can find your BMI and can help you achieve or maintain a healthy weight. For adults 20 years and older:  A BMI below 18.5 is considered underweight.  A BMI of 18.5 to 24.9 is normal.  A BMI of 25 to 29.9 is considered overweight.  A BMI of 30 and above is considered obese.  Maintain normal blood lipids and cholesterol levels by exercising and minimizing your intake  of saturated fat. Eat a balanced diet with plenty of fruit and vegetables. Blood tests for lipids and cholesterol should begin at age 104 and be repeated every 5 years. If your lipid or cholesterol levels are high, you are over 50, or you are at high risk for heart disease, you may need your cholesterol levels checked more frequently. Ongoing high lipid and cholesterol levels should be treated with medicines if diet and exercise are not working.  If you smoke, find out from your health care provider how to quit. If you do not use tobacco, do not start.  Lung cancer screening is recommended for adults aged 69-80 years who are at high risk for developing lung cancer because of a history of smoking. A yearly low-dose CT scan of the lungs is recommended for people who have at least a 30-pack-year history of smoking and are a current smoker or have quit within the past 15 years. A pack year of smoking is smoking an average of 1 pack of cigarettes a day for 1 year (for example: 1 pack a day for 30 years or 2 packs a day for 15 years). Yearly screening should continue until the smoker has stopped smoking for at least 15 years. Yearly screening should be stopped for people who develop a health problem that would prevent them from having lung cancer treatment.  If you choose to  drink alcohol, do not have more than 2 drinks per day. One drink is considered to be 12 ounces (355 mL) of beer, 5 ounces (148 mL) of wine, or 1.5 ounces (44 mL) of liquor.  Avoid use of street drugs. Do not share needles with anyone. Ask for help if you need support or instructions about stopping the use of drugs.  High blood pressure causes heart disease and increases the risk of stroke. Your blood pressure should be checked at least every 1-2 years. Ongoing high blood pressure should be treated with medicines, if weight loss and exercise are not effective.  If you are 69-107 years old, ask your health care provider if you should take aspirin to prevent heart disease.  Diabetes screening involves taking a blood sample to check your fasting blood sugar level. Testing should be considered at a younger age or be carried out more frequently if you are overweight and have at least 1 risk factor for diabetes.  Colorectal cancer can be detected and often prevented. Most routine colorectal cancer screening begins at the age of 38 and continues through age 64. However, your health care provider may recommend screening at an earlier age if you have risk factors for colon cancer. On a yearly basis, your health care provider may provide home test kits to check for hidden blood in the stool. Use of a small camera at the end of a tube to directly examine the colon (sigmoidoscopy or colonoscopy) can detect the earliest forms of colorectal cancer. Talk to your health care provider about this at age 17, when routine screening begins. Direct exam of the colon should be repeated every 5-10 years through age 77, unless early forms of precancerous polyps or small growths are found.  Hepatitis C blood testing is recommended for all people born from [birth year] through 1965 and any individual with known risks for hepatitis C.  Screening for abdominal aortic aneurysm (AAA)  by ultrasound is recommended for people who have history  of high blood pressure or who are current or former smokers.  Healthy men should  receive prostate-specific antigen (  PSA) blood tests as part of routine cancer screening. Talk with your health care provider about prostate cancer screening.  Testicular cancer screening is  recommended for adult males. Screening includes self-exam, a health care provider exam, and other screening tests. Consult with your health care provider about any symptoms you have or any concerns you have about testicular cancer.  Use sunscreen. Apply sunscreen liberally and repeatedly throughout the day. You should seek shade when your shadow is shorter than you. Protect yourself by wearing long sleeves, pants, a wide-brimmed hat, and sunglasses year round, whenever you are outdoors.  Once a month, do a whole-body skin exam, using a mirror to look at the skin on your back. Tell your health care provider about new moles, moles that have irregular borders, moles that are larger than a pencil eraser, or moles that have changed in shape or color.  Stay current with required vaccines (immunizations).  Influenza vaccine. All adults should be immunized every year.  Tetanus, diphtheria, and acellular pertussis (Td, Tdap) vaccine. An adult who has not previously received Tdap or who does not know his vaccine status should receive 1 dose of Tdap. This initial dose should be followed by tetanus and diphtheria toxoids (Td) booster doses every 10 years. Adults with an unknown or incomplete history of completing a 3-dose immunization series with Td-containing vaccines should begin or complete a primary immunization series including a Tdap dose. Adults should receive a Td booster every 10 years.  Zoster vaccine. One dose is recommended for adults aged 81 years or older unless certain conditions are present.    PREVNAR - Pneumococcal 13-valent conjugate (PCV13) vaccine. When indicated, a person who is uncertain of his immunization history  and has no record of immunization should receive the PCV13 vaccine. An adult aged 32 years or older who has certain medical conditions and has not been previously immunized should receive 1 dose of PCV13 vaccine. This PCV13 should be followed with a dose of pneumococcal polysaccharide (PPSV23) vaccine. The PPSV23 vaccine dose should be obtained 1 or more year(s)after the dose of PCV13 vaccine. An adult aged 41 years or older who has certain medical conditions and previously received 1 or more doses of PPSV23 vaccine should receive 1 dose of PCV13. The PCV13 vaccine dose should be obtained 1 or more years after the last PPSV23 vaccine dose.    PNEUMOVAX - Pneumococcal polysaccharide (PPSV23) vaccine. When PCV13 is also indicated, PCV13 should be obtained first. All adults aged 3 years and older should be immunized. An adult younger than age 78 years who has certain medical conditions should be immunized. Any person who resides in a nursing home or long-term care facility should be immunized. An adult smoker should be immunized. People with an immunocompromised condition and certain other conditions should receive both PCV13 and PPSV23 vaccines. People with human immunodeficiency virus (HIV) infection should be immunized as soon as possible after diagnosis. Immunization during chemotherapy or radiation therapy should be avoided. Routine use of PPSV23 vaccine is not recommended for American Indians, [place] Natives, or people younger than 65 years unless there are medical conditions that require PPSV23 vaccine. When indicated, people who have unknown immunization and have no record of immunization should receive PPSV23 vaccine. One-time revaccination 5 years after the first dose of PPSV23 is recommended for people aged 19-64 years who have chronic kidney failure, nephrotic syndrome, asplenia, or immunocompromised conditions. People who received 1-2 doses of PPSV23 before age 61 years should receive another dose of  PPSV23  vaccine at age 59 years or later if at least 5 years have passed since the previous dose. Doses of PPSV23 are not needed for people immunized with PPSV23 at or after age 28 years.    Hepatitis A vaccine. Adults who wish to be protected from this disease, have certain high-risk conditions, work with hepatitis A-infected animals, work in hepatitis A research labs, or travel to or work in countries with a high rate of hepatitis A should be immunized. Adults who were previously unvaccinated and who anticipate close contact with an international adoptee during the first 60 days after arrival in the [place] from a country with a high rate of hepatitis A should be immunized.    Hepatitis B vaccine. Adults should be immunized if they wish to be protected from this disease, have certain high-risk conditions, may be exposed to blood or other infectious body fluids, are household contacts or sex partners of hepatitis B positive people, are clients or workers in certain care facilities, or travel to or work in countries with a high rate of hepatitis B.   Preventive Service / Frequency   Ages 52 and over  Blood pressure check.  Lipid and cholesterol check.  Lung cancer screening. / Every year if you are aged 40-80 years and have a 30-pack-year history of smoking and currently smoke or have quit within the past 15 years. Yearly screening is stopped once you have quit smoking for at least 15 years or develop a health problem that would prevent you from having lung cancer treatment.  Fecal occult blood test (FOBT) of stool. You may not have to do this test if you get a colonoscopy every 10 years.  Flexible sigmoidoscopy** or colonoscopy.** / Every 5 years for a flexible sigmoidoscopy or every 10 years for a colonoscopy beginning at age 32 and continuing until age 67.  Hepatitis C blood test.** / For all people born from [birth year] through 1964 and any individual with known risks for hepatitis  C.  Abdominal aortic aneurysm (AAA) screening./ Screening current or former smokers or have Hypertension.  Skin self-exam. / Monthly.  Influenza vaccine. / Every year.  Tetanus, diphtheria, and acellular pertussis (Tdap/Td) vaccine.** / 1 dose of Td every 10 years.   Zoster vaccine.** / 1 dose for adults aged 81 years or older.         Pneumococcal 13-valent conjugate (PCV13) vaccine.    Pneumococcal polysaccharide (PPSV23) vaccine.     Hepatitis A vaccine.** / Consult your health care provider.  Hepatitis B vaccine.** / Consult your health care provider. Screening for abdominal aortic aneurysm (AAA)  by ultrasound is recommended for people who have history of high blood pressure or who are current or former smokers. ++++++++++ Recommend Adult Low Dose Aspirin or  coated  Aspirin 81 mg daily  To reduce risk of Colon Cancer 20 %,  Skin Cancer 26 % ,  Malignant Melanoma 46%  and  Pancreatic cancer 60% ++++++++++++++++++++++ Vitamin D goal  is between 70-100.  Please make sure that you are taking your Vitamin D as directed.  It is very important as a natural anti-inflammatory  helping hair, skin, and nails, as well as reducing stroke and heart attack risk.  It helps your bones and helps with mood. It also decreases numerous cancer risks so please take it as directed.  Low Vit D is associated with a 200-300% higher risk for CANCER  and 200-300% higher risk for HEART   ATTACK  &  STROKE.   .....................................Marland Kitchen  It is also associated with higher death rate at younger ages,  autoimmune diseases like Rheumatoid arthritis, Lupus, Multiple Sclerosis.    Also many other serious conditions, like depression, Alzheimer's Dementia, infertility, muscle aches, fatigue, fibromyalgia - just to name a few. ++++++++++++++++++++++ Recommend the book "The END of DIETING" by Dr Excell Seltzer  & the book "The END of DIABETES " by Dr Excell Seltzer At [email or web address] - get book &  Audio CD's    Being diabetic has a  300% increased risk for heart attack, stroke, cancer, and alzheimer- type vascular dementia. It is very important that you work harder with diet by avoiding all foods that are white. Avoid white rice (brown & wild rice is OK), white potatoes (sweetpotatoes in moderation is OK), White bread or wheat bread or anything made out of white flour like bagels, donuts, rolls, buns, biscuits, cakes, pastries, cookies, pizza crust, and pasta (made from white flour & egg whites) - vegetarian pasta or spinach or wheat pasta is OK. Multigrain breads like Arnold's or Pepperidge Farm, or multigrain sandwich thins or flatbreads.  Diet, exercise and weight loss can reverse and cure diabetes in the early stages.  Diet, exercise and weight loss is very important in the control and prevention of complications of diabetes which affects every system in your body, ie. Brain - dementia/stroke, eyes - glaucoma/blindness, heart - heart attack/heart failure, kidneys - dialysis, stomach - gastric paralysis, intestines - malabsorption, nerves - severe painful neuritis, circulation - gangrene & loss of a leg(s), and finally cancer and Alzheimers.    I recommend avoid fried & greasy foods,  sweets/candy, white rice (brown or wild rice or Quinoa is OK), white potatoes (sweet potatoes are OK) - anything made from white flour - bagels, doughnuts, rolls, buns, biscuits,white and wheat breads, pizza crust and traditional pasta made of white flour & egg white(vegetarian pasta or spinach or wheat pasta is OK).  Multi-grain bread is OK - like multi-grain flat bread or sandwich thins. Avoid alcohol in excess. Exercise is also important.    Eat all the vegetables you want - avoid meat, especially red meat and dairy - especially cheese.  Cheese is the most concentrated form of trans-fats which is the worst thing to clog up our arteries. Veggie cheese is OK which can be found in the fresh produce section at  [employer] or [employer] or [employer]  ++++++++++++++++++++++ DASH Eating Plan  DASH stands for "Dietary Approaches to Stop Hypertension."   The DASH eating plan is a healthy eating plan that has been shown to reduce high blood pressure (hypertension). Additional health benefits may include reducing the risk of type 2 diabetes mellitus, heart disease, and stroke. The DASH eating plan may also help with weight loss. WHAT DO I NEED TO KNOW ABOUT THE DASH EATING PLAN? For the DASH eating plan, you will follow these general guidelines:  Choose foods with a percent daily value for sodium of less than 5% (as listed on the food label).  Use salt-free seasonings or herbs instead of table salt or sea salt.  Check with your health care provider or pharmacist before using salt substitutes.  Eat lower-sodium products, often labeled as "lower sodium" or "no salt added."  Eat fresh foods.  Eat more vegetables, fruits, and low-fat dairy products.  Choose whole grains. Look for the word "whole" as the first word in the ingredient list.  Choose fish   Limit sweets, desserts, sugars, and sugary drinks.  Choose heart-healthy fats.  Eat veggie cheese   Eat more home-cooked food and less restaurant, buffet, and fast food.  Limit fried foods.  Cook foods using methods other than frying.  Limit canned vegetables. If you do use them, rinse them well to decrease the sodium.  When eating at a restaurant, ask that your food be prepared with less salt, or no salt if possible.                      WHAT FOODS CAN I EAT? Read Dr Fara Olden Fuhrman's books on The End of Dieting & The End of Diabetes  Grains Whole grain or whole wheat bread. Brown rice. Whole grain or whole wheat pasta. Quinoa, bulgur, and whole grain cereals. Low-sodium cereals. Corn or whole wheat flour tortillas. Whole grain cornbread. Whole grain crackers. Low-sodium crackers.  Vegetables Fresh or frozen vegetables (raw, steamed,  roasted, or grilled). Low-sodium or reduced-sodium tomato and vegetable juices. Low-sodium or reduced-sodium tomato sauce and paste. Low-sodium or reduced-sodium canned vegetables.   Fruits All fresh, canned (in natural juice), or frozen fruits.  Protein Products  All fish and seafood.  Dried beans, peas, or lentils. Unsalted nuts and seeds. Unsalted canned beans.  Dairy Low-fat dairy products, such as skim or 1% milk, 2% or reduced-fat cheeses, low-fat ricotta or cottage cheese, or plain low-fat yogurt. Low-sodium or reduced-sodium cheeses.  Fats and Oils Tub margarines without trans fats. Light or reduced-fat mayonnaise and salad dressings (reduced sodium). Avocado. Safflower, olive, or canola oils. Natural peanut or almond butter.  Other Unsalted popcorn and pretzels. The items listed above may not be a complete list of recommended foods or beverages. Contact your dietitian for more options.  ++++++++++++++++++++  WHAT FOODS ARE NOT RECOMMENDED? Grains/ White flour or wheat flour White bread. White pasta. White rice. Refined cornbread. Bagels and croissants. Crackers that contain trans fat.  Vegetables  Creamed or fried vegetables. Vegetables in a . Regular canned vegetables. Regular canned tomato sauce and paste. Regular tomato and vegetable juices.  Fruits Dried fruits. Canned fruit in light or heavy syrup. Fruit juice.  Meat and Other Protein Products Meat in general - RED meat & White meat.  Fatty cuts of meat. Ribs, chicken wings, all processed meats as bacon, sausage, bologna, salami, fatback, hot dogs, bratwurst and packaged luncheon meats.  Dairy Whole or 2% milk, cream, half-and-half, and cream cheese. Whole-fat or sweetened yogurt. Full-fat cheeses or blue cheese. Non-dairy creamers and whipped toppings. Processed cheese, cheese spreads, or cheese curds.  Condiments Onion and garlic salt, seasoned salt, table salt, and sea salt. Canned and packaged gravies.  Worcestershire sauce. Tartar sauce. Barbecue sauce. Teriyaki sauce. Soy sauce, including reduced sodium. Steak sauce. Fish sauce. Oyster sauce. Cocktail sauce. Horseradish. Ketchup and mustard. Meat flavorings and tenderizers. Bouillon cubes. Hot sauce. Tabasco sauce. Marinades. Taco seasonings. Relishes.  Fats and Oils Butter, stick margarine, lard, shortening and bacon fat. Coconut, palm kernel, or palm oils. Regular salad dressings.  Pickles and olives. Salted popcorn and pretzels.  The items listed above may not be a complete list of foods and beverages to avoid.

## 2018-12-18 LAB — COMPLETE METABOLIC PANEL WITH GFR
AG Ratio: 2.1 (calc) (ref 1.0–2.5)
ALT: 20 U/L (ref 9–46)
AST: 23 U/L (ref 10–35)
Albumin: 4.7 g/dL (ref 3.6–5.1)
Alkaline phosphatase (APISO): 59 U/L (ref 35–144)
BUN: 20 mg/dL (ref 7–25)
CO2: 24 mmol/L (ref 20–32)
Calcium: 9.4 mg/dL (ref 8.6–10.3)
Chloride: 103 mmol/L (ref 98–110)
Creat: 1.01 mg/dL (ref 0.70–1.18)
GFR, Est African American: 83 mL/min/{1.73_m2} (ref >=60)
GFR, Est Non African American: 72 mL/min/{1.73_m2} (ref >=60)
Globulin: 2.2 g/dL (calc) (ref 1.9–3.7)
Glucose, Bld: 83 mg/dL (ref 65–99)
Potassium: 4 mmol/L (ref 3.5–5.3)
Sodium: 138 mmol/L (ref 135–146)
Total Bilirubin: 1.3 mg/dL — ABNORMAL HIGH (ref 0.2–1.2)
Total Protein: 6.9 g/dL (ref 6.1–8.1)

## 2018-12-18 LAB — VITAMIN D 25 HYDROXY (VIT D DEFICIENCY, FRACTURES): Vit D, 25-Hydroxy: 75 ng/mL (ref 30–100)

## 2018-12-18 LAB — CBC WITH DIFFERENTIAL/PLATELET
Absolute Monocytes: 490 cells/uL (ref 200–950)
Basophils Absolute: 28 cells/uL (ref 0–200)
Basophils Relative: 0.5 %
Eosinophils Absolute: 99 cells/uL (ref 15–500)
Eosinophils Relative: 1.8 %
HCT: 46.2 % (ref 38.5–50.0)
Hemoglobin: 16 g/dL (ref 13.2–17.1)
Lymphs Abs: 1678 cells/uL (ref 850–3900)
MCH: 33.1 pg — ABNORMAL HIGH (ref 27.0–33.0)
MCHC: 34.6 g/dL (ref 32.0–36.0)
MCV: 95.7 fL (ref 80.0–100.0)
MPV: 10.7 fL (ref 7.5–12.5)
Monocytes Relative: 8.9 %
Neutro Abs: 3207 cells/uL (ref 1500–7800)
Neutrophils Relative %: 58.3 %
Platelets: 154 10*3/uL (ref 140–400)
RBC: 4.83 10*6/uL (ref 4.20–5.80)
RDW: 12.6 % (ref 11.0–15.0)
Total Lymphocyte: 30.5 %
WBC: 5.5 10*3/uL (ref 3.8–10.8)

## 2018-12-18 LAB — LIPID PANEL
Cholesterol: 168 mg/dL (ref <200)
HDL: 63 mg/dL (ref >=40)
LDL Cholesterol (Calc): 89 mg/dL (calc)
Non-HDL Cholesterol (Calc): 105 mg/dL (calc) (ref <130)
Total CHOL/HDL Ratio: 2.7 (calc) (ref <5.0)
Triglycerides: 74 mg/dL (ref <150)

## 2018-12-18 LAB — TSH: TSH: 1.72 mIU/L (ref 0.40–4.50)

## 2018-12-18 LAB — HEMOGLOBIN A1C
Hgb A1c MFr Bld: 5.3 % of total Hgb (ref <5.7)
Mean Plasma Glucose: 105 (calc)
eAG (mmol/L): 5.8 (calc)

## 2018-12-18 LAB — INSULIN, RANDOM: Insulin: 11.2 u[IU]/mL

## 2018-12-18 LAB — MAGNESIUM: Magnesium: 2.1 mg/dL (ref 1.5–2.5)

## 2019-03-25 ENCOUNTER — Encounter: Payer: Self-pay | Admitting: Internal Medicine

## 2019-03-25 ENCOUNTER — Other Ambulatory Visit: Payer: Self-pay

## 2019-03-25 ENCOUNTER — Ambulatory Visit (INDEPENDENT_AMBULATORY_CARE_PROVIDER_SITE_OTHER): Payer: Medicare Other | Admitting: Internal Medicine

## 2019-03-25 VITALS — BP 114/72 | HR 72 | Temp 97.2°F | Resp 16 | Ht 75.0 in | Wt 227.2 lb

## 2019-03-25 DIAGNOSIS — Z0001 Encounter for general adult medical examination with abnormal findings: Secondary | ICD-10-CM

## 2019-03-25 DIAGNOSIS — E782 Mixed hyperlipidemia: Secondary | ICD-10-CM | POA: Diagnosis not present

## 2019-03-25 DIAGNOSIS — Z8249 Family history of ischemic heart disease and other diseases of the circulatory system: Secondary | ICD-10-CM

## 2019-03-25 DIAGNOSIS — R7309 Other abnormal glucose: Secondary | ICD-10-CM

## 2019-03-25 DIAGNOSIS — I10 Essential (primary) hypertension: Secondary | ICD-10-CM

## 2019-03-25 DIAGNOSIS — Z136 Encounter for screening for cardiovascular disorders: Secondary | ICD-10-CM | POA: Diagnosis not present

## 2019-03-25 DIAGNOSIS — Z79899 Other long term (current) drug therapy: Secondary | ICD-10-CM

## 2019-03-25 DIAGNOSIS — Z87891 Personal history of nicotine dependence: Secondary | ICD-10-CM

## 2019-03-25 DIAGNOSIS — N138 Other obstructive and reflux uropathy: Secondary | ICD-10-CM

## 2019-03-25 DIAGNOSIS — E559 Vitamin D deficiency, unspecified: Secondary | ICD-10-CM | POA: Diagnosis not present

## 2019-03-25 DIAGNOSIS — Z0000 Encounter for general adult medical examination without abnormal findings: Secondary | ICD-10-CM | POA: Diagnosis not present

## 2019-03-25 DIAGNOSIS — Z125 Encounter for screening for malignant neoplasm of prostate: Secondary | ICD-10-CM

## 2019-03-25 DIAGNOSIS — Z1211 Encounter for screening for malignant neoplasm of colon: Secondary | ICD-10-CM

## 2019-03-25 NOTE — Patient Instructions (Signed)

## 2019-03-25 NOTE — Progress Notes (Signed)
Annual  Screening/Preventative Visit  & Comprehensive Evaluation & Examination     [name] presents for a Screening /Preventative Visit & comprehensive evaluation and management of multiple medical co-morbidities.  Patient has been followed for HTN, HLD, Prediabetes and Vitamin D Deficiency.     HTN predates circa 1990. Patient's BP has been controlled at home.  Today's BP is at goal - 114/72. Patient denies any cardiac symptoms as chest pain, palpitations, shortness of breath, dizziness or ankle swelling.     Patient's hyperlipidemia is controlled with diet and medications. Patient denies myalgias or other medication SE's. Last lipids were  Lab Results  Component Value Date   CHOL 168 12/17/2018   HDL 63 12/17/2018   LDLCALC 89 12/17/2018   TRIG 74 12/17/2018   CHOLHDL 2.7 12/17/2018      Patient has hx/o prediabetes  (A1c 5.7%/2011 and 2016)  and patient denies reactive hypoglycemic symptoms, visual blurring, diabetic polys or paresthesias. Last A1c was Normal & at goal: Lab Results  Component Value Date   HGBA1C 5.3 12/17/2018       Finally, patient has history of Vitamin D Deficiency ("36" in 2008)  and last vitamin D was at goal: Lab Results  Component Value Date   VD25OH 10 12/17/2018   Current Outpatient Medications on File Prior to Visit  Medication Sig  . aspirin 81 MG EC tablet Take 81 mg by mouth daily.    . Cholecalciferol (VITAMIN D3) 10000 UNITS capsule Take 10,000 Units by mouth daily.   . enalapril (VASOTEC) 20 MG tablet TAKE 1 TABLET BY MOUTH EVERY DAY FOR BLOOD PRESSURE (Patient taking differently: 10 mg. )  . hydrochlorothiazide (HYDRODIURIL) 12.5 MG tablet TAKE 1 TABLET BY MOUTH EVERY MORNING FOR BLOOD PRESSURE  . latanoprost (XALATAN) 0.005 % ophthalmic solution Place 1 drop into both eyes at bedtime.  . magnesium oxide (MAG-OX) 400 MG tablet Take 400 mg by mouth 2 (two) times daily.   . Multiple Vitamin (MULTIVITAMIN) tablet Take 1 tablet  by mouth daily.    . Omega-3 Fatty Acids (FISH OIL) 1200 MG CAPS Take 1 capsule by mouth 2 (two) times daily.   . pravastatin (PRAVACHOL) 40 MG tablet TAKE 1 TABLET BY MOUTH EVERY NIGHT AT BEDTIME FOR CHOLESTEROL  . tamsulosin (FLOMAX) 0.4 MG CAPS capsule Take 1 capsul at Bedtime for Prostate  . vitamin C (ASCORBIC ACID) 500 MG tablet Take 500 mg by mouth daily.     No current facility-administered medications on file prior to visit.    Allergies  Allergen Reactions  . Viagra [Sildenafil Citrate]     Headache   Past Medical History:  Diagnosis Date  . BPH (benign prostatic hypertrophy)   . Colon polyps   . Elevated hemoglobin A1c   . Glaucoma   . Hypertension   . Vitamin D deficiency    Health Maintenance  Topic Date Due  . INFLUENZA VACCINE  03/01/2019  . TETANUS/TDAP  08/10/2019  . PNA vac Low Risk Adult  Completed   Immunization History  Administered Date(s) Administered  . Influenza-Unspecified 04/30/2014  . Pneumococcal Conjugate-13 06/08/2014  . Pneumococcal Polysaccharide-23 08/09/2009  . Td 08/09/2009   Last Colon - 09/07/2017 - Dr Arty Baumgartner - recc no F/U due to Age  Past Surgical History:  Procedure Laterality Date  . CARDIAC CATHETERIZATION  1999  . INGUINAL HERNIA REPAIR    . LAPAROSCOPIC APPENDECTOMY     Family History  Problem Relation Age of  Onset  . Hypertension Mother   . Alzheimer's disease Mother   . Hyperlipidemia Father   . Hypertension Father   . Cancer Father        esophagus  . Lymphoma Father   . Hypertension Sister   . Hyperlipidemia Sister    Social History   Socioeconomic History  . Marital status: Married    Spouse name: Mardene Celeste  . Number of children: 2 daughters - 5 GC  Occupational History  . Retired Airline pilot  Tobacco Use  . Smoking status: Former Smoker    Quit date: 07/31/2001    Years since quitting: 17.6  . Smokeless tobacco: Never Used  Substance and Sexual Activity  . Alcohol use: No  . Drug use: No  .  Sexual activity: Not on file    ROS Constitutional: Denies fever, chills, weight loss/gain, headaches, insomnia,  night sweats or change in appetite. Does c/o fatigue. Eyes: Denies redness, blurred vision, diplopia, discharge, itchy or watery eyes.  ENT: Denies discharge, congestion, post nasal drip, epistaxis, sore throat, earache, hearing loss, dental pain, Tinnitus, Vertigo, Sinus pain or snoring.  Cardio: Denies chest pain, palpitations, irregular heartbeat, syncope, dyspnea, diaphoresis, orthopnea, PND, claudication or edema Respiratory: denies cough, dyspnea, DOE, pleurisy, hoarseness, laryngitis or wheezing.  Gastrointestinal: Denies dysphagia, heartburn, reflux, water brash, pain, cramps, nausea, vomiting, bloating, diarrhea, constipation, hematemesis, melena, hematochezia, jaundice or hemorrhoids Genitourinary: Denies dysuria, frequency, urgency, nocturia, hesitancy, discharge, hematuria or flank pain Musculoskeletal: Denies arthralgia, myalgia, stiffness, Jt. Swelling, pain, limp or strain/sprain. Denies Falls. Skin: Denies puritis, rash, hives, warts, acne, eczema or change in skin lesion Neuro: No weakness, tremor, incoordination, spasms, paresthesia or pain Psychiatric: Denies confusion, memory loss or sensory loss. Denies Depression. Endocrine: Denies change in weight, skin, hair change, nocturia, and paresthesia, diabetic polys, visual blurring or hyper / hypo glycemic episodes.  Heme/Lymph: No excessive bleeding, bruising or enlarged lymph nodes.  Physical Exam  BP 114/72   Pulse 72   Temp (!) 97.2 F (36.2 C)   Resp 16   Ht 6\' 3"  (1.905 m)   Wt 227 lb 3.2 oz (103.1 kg)   BMI 28.40 kg/m   General Appearance: Well nourished and well groomed and in no apparent distress.  Eyes: PERRLA, EOMs, conjunctiva no swelling or erythema, normal fundi and vessels. Sinuses: No frontal/maxillary tenderness ENT/Mouth: EACs patent / TMs  nl. Nares clear without erythema, swelling,  mucoid exudates. Oral hygiene is good. No erythema, swelling, or exudate. Tongue normal, non-obstructing. Tonsils not swollen or erythematous. Hearing normal.  Neck: Supple, thyroid not palpable. No bruits, nodes or JVD. Respiratory: Respiratory effort normal.  BS equal and clear bilateral without rales, rhonci, wheezing or stridor. Cardio: Heart sounds are normal with regular rate and rhythm and no murmurs, rubs or gallops. Peripheral pulses are normal and equal bilaterally without edema. No aortic or femoral bruits. Chest: symmetric with normal excursions and percussion.  Abdomen: Soft, with Nl bowel sounds. Nontender, no guarding, rebound, hernias, masses, or organomegaly.  Lymphatics: Non tender without lymphadenopathy.  Musculoskeletal: Full ROM all peripheral extremities, joint stability, 5/5 strength, and normal gait. Skin: Warm and dry without rashes, lesions, cyanosis, clubbing or  ecchymosis.  Neuro: Cranial nerves intact, reflexes equal bilaterally. Normal muscle tone, no cerebellar symptoms. Sensation intact.  Pysch: Alert and oriented X 3 with normal affect, insight and judgment appropriate.   Assessment and Plan  1. Annual Preventative/Screening Exam   1. Encounter for general adult medical examination with abnormal findings   2.  Essential hypertension  - EKG 12-Lead - [place], RETROPERITNL ABD,  LTD - Urinalysis, Routine w reflex microscopic - Microalbumin / creatinine urine ratio - CBC with Differential/Platelet - COMPLETE METABOLIC PANEL WITH GFR - Magnesium - TSH  3. Hyperlipidemia, mixed  - EKG 12-Lead - [place], RETROPERITNL ABD,  LTD - Lipid panel - TSH  4. Abnormal glucose  - EKG 12-Lead - [place], RETROPERITNL ABD,  LTD - Hemoglobin A1c - Insulin, random  5. Vitamin D deficiency  - VITAMIN D 25 Hydroxy (Vit-D Deficiency, Fractures)  6. Prediabetes  - EKG 12-Lead - [place], RETROPERITNL ABD,  LTD - Hemoglobin A1c - Insulin, random  7. Screening for colorectal  cancer  - POC Hemoccult Bld/Stl (3-Cd Home Screen); Future  8. BPH with obstruction/lower urinary tract symptoms  - PSA  9. Prostate cancer screening  - PSA  10. Screening for ischemic heart disease  - EKG 12-Lead  11. FH: hypertension  - EKG 12-Lead - [place], RETROPERITNL ABD,  LTD  12. Former smoker  - EKG 12-Lead - [place], RETROPERITNL ABD,  LTD  13. Screening for AAA (aortic abdominal aneurysm)  - [place], RETROPERITNL ABD,  LTD  14. Medication management  - Urinalysis, Routine w reflex microscopic - Microalbumin / creatinine urine ratio - CBC with Differential/Platelet - COMPLETE METABOLIC PANEL WITH GFR - Magnesium - Lipid panel - TSH - Hemoglobin A1c - Insulin, random - VITAMIN D 25 Hydroxyl        Patient was counseled in prudent diet, weight control to achieve/maintain BMI less than 25, BP monitoring, regular exercise and medications as discussed.  Discussed med effects and SE's. Routine screening labs and tests as requested with regular follow-up as recommended. Over 40 minutes of exam, counseling, chart review and high complex critical decision making was performed   Kirtland Bouchard, MD

## 2019-03-26 ENCOUNTER — Other Ambulatory Visit: Payer: Self-pay | Admitting: Internal Medicine

## 2019-03-26 LAB — LIPID PANEL
Cholesterol: 172 mg/dL (ref <200)
HDL: 59 mg/dL (ref >=40)
LDL Cholesterol (Calc): 92 mg/dL (calc)
Non-HDL Cholesterol (Calc): 113 mg/dL (calc) (ref <130)
Total CHOL/HDL Ratio: 2.9 (calc) (ref <5.0)
Triglycerides: 117 mg/dL (ref <150)

## 2019-03-26 LAB — COMPLETE METABOLIC PANEL WITH GFR
AG Ratio: 2.1 (calc) (ref 1.0–2.5)
ALT: 21 U/L (ref 9–46)
AST: 21 U/L (ref 10–35)
Albumin: 4.6 g/dL (ref 3.6–5.1)
Alkaline phosphatase (APISO): 58 U/L (ref 35–144)
BUN: 19 mg/dL (ref 7–25)
CO2: 28 mmol/L (ref 20–32)
Calcium: 9.8 mg/dL (ref 8.6–10.3)
Chloride: 102 mmol/L (ref 98–110)
Creat: 1.11 mg/dL (ref 0.70–1.18)
GFR, Est African American: 74 mL/min/{1.73_m2} (ref >=60)
GFR, Est Non African American: 64 mL/min/{1.73_m2} (ref >=60)
Globulin: 2.2 g/dL (calc) (ref 1.9–3.7)
Glucose, Bld: 85 mg/dL (ref 65–99)
Potassium: 4.9 mmol/L (ref 3.5–5.3)
Sodium: 140 mmol/L (ref 135–146)
Total Bilirubin: 0.7 mg/dL (ref 0.2–1.2)
Total Protein: 6.8 g/dL (ref 6.1–8.1)

## 2019-03-26 LAB — CBC WITH DIFFERENTIAL/PLATELET
Absolute Monocytes: 512 cells/uL (ref 200–950)
Basophils Absolute: 38 cells/uL (ref 0–200)
Basophils Relative: 0.6 %
Eosinophils Absolute: 141 cells/uL (ref 15–500)
Eosinophils Relative: 2.2 %
HCT: 44.1 % (ref 38.5–50.0)
Hemoglobin: 15.2 g/dL (ref 13.2–17.1)
Lymphs Abs: 2106 cells/uL (ref 850–3900)
MCH: 34 pg — ABNORMAL HIGH (ref 27.0–33.0)
MCHC: 34.5 g/dL (ref 32.0–36.0)
MCV: 98.7 fL (ref 80.0–100.0)
MPV: 10.6 fL (ref 7.5–12.5)
Monocytes Relative: 8 %
Neutro Abs: 3603 cells/uL (ref 1500–7800)
Neutrophils Relative %: 56.3 %
Platelets: 152 10*3/uL (ref 140–400)
RBC: 4.47 10*6/uL (ref 4.20–5.80)
RDW: 12.9 % (ref 11.0–15.0)
Total Lymphocyte: 32.9 %
WBC: 6.4 10*3/uL (ref 3.8–10.8)

## 2019-03-26 LAB — URINALYSIS, ROUTINE W REFLEX MICROSCOPIC
Bilirubin Urine: NEGATIVE
Glucose, UA: NEGATIVE
Hgb urine dipstick: NEGATIVE
Ketones, ur: NEGATIVE
Leukocytes,Ua: NEGATIVE
Nitrite: NEGATIVE
Protein, ur: NEGATIVE
Specific Gravity, Urine: 1.013 (ref 1.001–1.03)
pH: 5.5 (ref 5.0–8.0)

## 2019-03-26 LAB — MAGNESIUM: Magnesium: 2.1 mg/dL (ref 1.5–2.5)

## 2019-03-26 LAB — MICROALBUMIN / CREATININE URINE RATIO
Creatinine, Urine: 64 mg/dL (ref 20–320)
Microalb, Ur: <0.2 mg/dL

## 2019-03-26 LAB — INSULIN, RANDOM: Insulin: 8.5 u[IU]/mL

## 2019-03-26 LAB — HEMOGLOBIN A1C
Hgb A1c MFr Bld: 5.4 % of total Hgb (ref <5.7)
Mean Plasma Glucose: 108 (calc)
eAG (mmol/L): 6 (calc)

## 2019-03-26 LAB — PSA: PSA: 0.4 ng/mL (ref <=4.0)

## 2019-03-26 LAB — TSH: TSH: 2.76 mIU/L (ref 0.40–4.50)

## 2019-03-26 LAB — VITAMIN D 25 HYDROXY (VIT D DEFICIENCY, FRACTURES): Vit D, 25-Hydroxy: 98 ng/mL (ref 30–100)

## 2019-03-26 MED ORDER — GABAPENTIN 300 MG PO CAPS: ORAL_CAPSULE | ORAL | 0 refills | Status: DC

## 2019-04-15 ENCOUNTER — Other Ambulatory Visit: Payer: Self-pay

## 2019-04-15 DIAGNOSIS — Z1211 Encounter for screening for malignant neoplasm of colon: Secondary | ICD-10-CM | POA: Diagnosis not present

## 2019-04-15 DIAGNOSIS — Z1212 Encounter for screening for malignant neoplasm of rectum: Secondary | ICD-10-CM

## 2019-04-15 LAB — POC HEMOCCULT BLD/STL (HOME/3-CARD/SCREEN)
Card #2 Fecal Occult Blod, POC: NEGATIVE
Card #3 Fecal Occult Blood, POC: NEGATIVE
Fecal Occult Blood, POC: NEGATIVE

## 2019-04-21 DIAGNOSIS — H402211 Chronic angle-closure glaucoma, right eye, mild stage: Secondary | ICD-10-CM | POA: Diagnosis not present

## 2019-04-21 DIAGNOSIS — H402222 Chronic angle-closure glaucoma, left eye, moderate stage: Secondary | ICD-10-CM | POA: Diagnosis not present

## 2019-04-22 ENCOUNTER — Other Ambulatory Visit: Payer: Self-pay | Admitting: Internal Medicine

## 2019-04-22 DIAGNOSIS — F5101 Primary insomnia: Secondary | ICD-10-CM

## 2019-04-22 MED ORDER — GABAPENTIN 300 MG PO CAPS: ORAL_CAPSULE | ORAL | 3 refills | Status: DC

## 2019-05-10 ENCOUNTER — Other Ambulatory Visit: Payer: Self-pay | Admitting: Internal Medicine

## 2019-05-10 DIAGNOSIS — I10 Essential (primary) hypertension: Secondary | ICD-10-CM

## 2019-05-10 MED ORDER — HYDROCHLOROTHIAZIDE 12.5 MG PO TABS: ORAL_TABLET | ORAL | 3 refills | Status: DC

## 2019-05-19 ENCOUNTER — Other Ambulatory Visit: Payer: Self-pay | Admitting: Internal Medicine

## 2019-06-09 ENCOUNTER — Ambulatory Visit: Payer: Self-pay | Admitting: Adult Health

## 2019-06-30 NOTE — Progress Notes (Signed)
FOLLOW UP  Assessment and Plan:   Hypertension At goal with current regimen; continue medications  Monitor blood pressure at home; patient to call if consistently greater than 130/80 Continue DASH diet.   Reminder to go to the ER if any CP, SOB, nausea, dizziness, severe HA, changes vision/speech, left arm numbness and tingling and jaw pain.  Cholesterol Currently at goal; continue statin therapy  Continue low cholesterol diet and exercise.  Check lipid panel.   Other abnormal glucose Recent A1Cs at goal -  Discussed diet/exercise, weight management  Defer A1C; monitor weight and serum glucose via CMP  Overweight (BMI 28) with co morbidities Long discussion about weight loss, diet, and exercise -  Recommended diet heavy in fruits and veggies and low in animal meats, cheeses, and dairy products, appropriate calorie intake Discussed ideal weight for height  Patient will work on continue with low carb diet, portion control  discussed importance of exercise and to try starting walking 15 min~30 min daily for cardiovascular health and memory Will follow up in 3 months  Vitamin D Def At goal at last visit; continue supplementation to maintain goal of 70-100 Defer Vit D level  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 Further disposition pending results of labs. Discussed med's effects and SE's.   Over 30 minutes of exam, counseling, chart review, and critical decision making was performed.   Future Appointments  Date Time Provider Man  10/08/2019  9:30 AM Unk Pinto, MD [facility] None  12/19/2019  9:30 AM Garnet Sierras, NP [facility] None  04/21/2020  9:00 AM  Unk Pinto, MD [facility] None    ----------------------------------------------------------------------------------------------------------------------  HPI 78 y.o. male  presents for 3 month follow up on hypertension, cholesterol, glucose management, weight, depression/anxiety and vitamin D deficiency.   he has a diagnosis of depression/anxiety that is significantly improved from previous/in remission off of medications he currently uses gabapentin 300 mg at night for sleep; previous xanax was tapered to d/c'd successfully in 2019.   BMI is Body mass index is 28.25 kg/m., he has been working on diet and exercise- he has cut out sugar, high fructose corn syrup, white starches and has lost 1 lb since last visit.  He walks occasionally, is active  Wt Readings from Last 3 Encounters:  07/01/19 226 lb (102.5 kg)  03/25/19 227 lb 3.2 oz (103.1 kg)  12/17/18 221 lb (100.2 kg)   His blood pressure has been controlled at home (120s/70s), today their BP is BP: 110/70.   He does not workout - picks up during the summer. He denies chest pain, shortness of breath, dizziness.   He is on cholesterol medication (pravastatin 20 mg daily) and denies myalgias. His cholesterol is at goal. The cholesterol last visit was:   Lab Results  Component Value Date   CHOL 172 03/25/2019   HDL 59 03/25/2019   LDLCALC 92 03/25/2019   TRIG 117 03/25/2019   CHOLHDL 2.9 03/25/2019    He has been working on diet and exercise for glucose management, and denies increased appetite, nausea, paresthesia of the feet, polydipsia, polyuria, visual disturbances, vomiting and weight loss. 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 aspirin 81 MG EC tablet Take 81 mg by mouth daily.    . Cholecalciferol (VITAMIN D3) 10000 UNITS capsule Take 10,000 Units by mouth daily.   . enalapril (VASOTEC) 20 MG tablet Take 1 tablet Daily for BP  . gabapentin (NEURONTIN) 300 MG capsule Take 1 to 2 capsules 1 to 2 hours before sleep  . hydrochlorothiazide (HYDRODIURIL) 12.5 MG tablet Take 1 tablet every Morning for BP & Fluid Retention / Ankle Swelling  . latanoprost (XALATAN) 0.005 % ophthalmic solution Place 1 drop into both eyes at bedtime.  . magnesium oxide (MAG-OX) 400 MG tablet Take 400 mg by mouth 2 (two) times daily.   . Multiple Vitamin (MULTIVITAMIN) tablet Take 1 tablet by mouth daily.    . Omega-3 Fatty Acids (FISH OIL) 1200 MG CAPS Take 1 capsule by mouth 2 (two) times daily.   . pravastatin (PRAVACHOL) 40 MG tablet TAKE 1 TABLET BY MOUTH EVERY NIGHT AT BEDTIME FOR CHOLESTEROL  . tamsulosin (FLOMAX) 0.4 MG CAPS capsule Take 1 capsul at Bedtime for Prostate  . vitamin C (ASCORBIC ACID) 500 MG tablet Take 500 mg by mouth daily.     No current facility-administered medications on file prior to visit.      Allergies:  Allergies  Allergen Reactions  . Viagra [Sildenafil Citrate]     Headache     Medical History:  Past Medical History:  Diagnosis Date  . BPH (benign prostatic hypertrophy)   . Colon polyps   . Elevated hemoglobin A1c   . Glaucoma   . Hypertension   . Vitamin D deficiency    Family history- Reviewed and unchanged Social history- Reviewed and unchanged   Review of Systems:  Review of Systems  Constitutional: Negative for malaise/fatigue and weight loss.  HENT: Negative for hearing loss and tinnitus.   Eyes: Negative for blurred vision and double vision.  Respiratory: Negative for cough, shortness of breath and wheezing.   Cardiovascular: Negative for chest pain, palpitations, orthopnea,  claudication and leg swelling.  Gastrointestinal: Negative for abdominal pain, blood in stool, constipation, diarrhea, heartburn, melena, nausea and vomiting.  Genitourinary: Negative.   Musculoskeletal: Negative for joint pain and myalgias.  Skin: Negative for rash.  Neurological: Negative for dizziness, tingling, sensory change, weakness and headaches.  Endo/Heme/Allergies: Negative for polydipsia.  Psychiatric/Behavioral: Negative.  Negative for depression, substance abuse and suicidal ideas. The patient is not nervous/anxious and does not have insomnia.   All other systems reviewed and are negative.     Physical Exam: BP 110/70   Pulse (!) 55   Temp (!) 97.3 F (36.3 C)   Wt 226 lb (102.5 kg)   SpO2 97%   BMI 28.25 kg/m  Wt Readings from Last 3 Encounters:  07/01/19 226 lb (102.5 kg)  03/25/19 227 lb 3.2 oz (103.1 kg)  12/17/18 221 lb (100.2 kg)   General Appearance: Well nourished, in no apparent distress. Eyes: PERRLA, EOMs, conjunctiva no swelling or erythema Sinuses: No Frontal/maxillary tenderness ENT/Mouth: Ext aud canals clear, TMs without erythema, bulging. No erythema, swelling, or exudate on post pharynx.  Tonsils not swollen  or erythematous. Mildly HOH.   Neck: Supple, thyroid normal.  Respiratory: Respiratory effort normal, BS equal bilaterally without rales, rhonchi, wheezing or stridor.  Cardio: RRR with no MRGs. Brisk peripheral pulses without edema.  Abdomen: Soft, + BS.  Non tender, no guarding, rebound, hernias, masses. Lymphatics: Non tender without lymphadenopathy.  Musculoskeletal: Full ROM, 5/5 strength, Normal gait Skin: Warm, dry without rashes, lesions, ecchymosis.  Neuro: Cranial nerves intact. No cerebellar symptoms.  Psych: Awake and oriented X 3, normal affect, Insight and Judgment appropriate.    Izora Ribas, NP 11:35 AM [facility]

## 2019-07-01 ENCOUNTER — Encounter: Payer: Self-pay | Admitting: Adult Health

## 2019-07-01 ENCOUNTER — Other Ambulatory Visit: Payer: Self-pay

## 2019-07-01 ENCOUNTER — Ambulatory Visit (INDEPENDENT_AMBULATORY_CARE_PROVIDER_SITE_OTHER): Payer: Medicare Other | Admitting: Adult Health

## 2019-07-01 VITALS — BP 110/70 | HR 55 | Temp 97.3°F | Wt 226.0 lb

## 2019-07-01 DIAGNOSIS — R7309 Other abnormal glucose: Secondary | ICD-10-CM

## 2019-07-01 DIAGNOSIS — E559 Vitamin D deficiency, unspecified: Secondary | ICD-10-CM

## 2019-07-01 DIAGNOSIS — E663 Overweight: Secondary | ICD-10-CM

## 2019-07-01 DIAGNOSIS — E782 Mixed hyperlipidemia: Secondary | ICD-10-CM | POA: Diagnosis not present

## 2019-07-01 DIAGNOSIS — Z79899 Other long term (current) drug therapy: Secondary | ICD-10-CM

## 2019-07-01 DIAGNOSIS — F325 Major depressive disorder, single episode, in full remission: Secondary | ICD-10-CM

## 2019-07-01 DIAGNOSIS — I10 Essential (primary) hypertension: Secondary | ICD-10-CM

## 2019-07-01 NOTE — Patient Instructions (Addendum)
Goals    . Exercise 150 min/wk Moderate Activity    . Weight (lb) < 215 lb (97.5 kg)         Drink 1/2 your body weight in fluid ounces of water daily; drink a tall glass of water 30 min before meals  Don't eat until you're stuffed- listen to your stomach and eat until you are 80% full   Try eating off of a salad plate; wait 10 min after finishing before going back for seconds  Start by eating the vegetables on your plate; aim for 50% of your meals to be fruits or vegetables  Then eat your protein - lean meats (grass fed if possible), fish, beans, nuts in moderation  Eat your carbs/starch last ONLY if you still are hungry. If you can, stop before finishing it all  Avoid sugar and flour - the closer it looks to it's original form in nature, typically the better it is for you  Splurge in moderation - "assign" days when you get to splurge and have the "bad stuff" - I like to follow a 80% - 20% plan- "good" choices 80 % of the time, "bad" choices in moderation 20% of the time  Simple equation is: Calories out > calories in = weight loss - even if you eat the bad stuff, if you limit portions, you will still lose weight      Exercising to Stay Healthy To become healthy and stay healthy, it is recommended that you do moderate-intensity and vigorous-intensity exercise. You can tell that you are exercising at a moderate intensity if your heart starts beating faster and you start breathing faster but can still hold a conversation. You can tell that you are exercising at a vigorous intensity if you are breathing much harder and faster and cannot hold a conversation while exercising. Exercising regularly is important. It has many health benefits, such as:  Improving overall fitness, flexibility, and endurance.  Increasing bone density.  Helping with weight control.  Decreasing body fat.  Increasing muscle strength.  Reducing stress and tension.  Improving overall health. How often  should I exercise? Choose an activity that you enjoy, and set realistic goals. Your health care provider can help you make an activity plan that works for you. Exercise regularly as told by your health care provider. This may include:  Doing strength training two times a week, such as: ? Lifting weights. ? Using resistance bands. ? Push-ups. ? Sit-ups. ? Yoga.  Doing a certain intensity of exercise for a given amount of time. Choose from these options: ? A total of 150 minutes of moderate-intensity exercise every week. ? A total of 75 minutes of vigorous-intensity exercise every week. ? A mix of moderate-intensity and vigorous-intensity exercise every week. Children, pregnant women, people who have not exercised regularly, people who are overweight, and older adults may need to talk with a health care provider about what activities are safe to do. If you have a medical condition, be sure to talk with your health care provider before you start a new exercise program. What are some exercise ideas? Moderate-intensity exercise ideas include:  Walking 1 mile (1.6 km) in about 15 minutes.  Biking.  Hiking.  Golfing.  Dancing.  Water aerobics. Vigorous-intensity exercise ideas include:  Walking 4.5 miles (7.2 km) or more in about 1 hour.  Jogging or running 5 miles (8 km) in about 1 hour.  Biking 10 miles (16.1 km) or more in about 1 hour.  Lap  swimming.  Roller-skating or in-line skating.  Cross-country skiing.  Vigorous competitive sports, such as football, basketball, and soccer.  Jumping rope.  Aerobic dancing. What are some everyday activities that can help me to get exercise?  [place] work, such as: ? Pushing a Conservation officer, nature. ? Raking and bagging leaves.  Washing your car.  Pushing a stroller.  Shoveling snow.  Gardening.  Washing windows or floors. How can I be more active in my day-to-day activities?  Use stairs instead of an elevator.  Take a walk  during your lunch break.  If you drive, park your car farther away from your work or school.  If you take public transportation, get off one stop early and walk the rest of the way.  Stand up or walk around during all of your indoor phone calls.  Get up, stretch, and walk around every 30 minutes throughout the day.  Enjoy exercise with a friend. Support to continue exercising will help you keep a regular routine of activity. What guidelines can I follow while exercising?  Before you start a new exercise program, talk with your health care provider.  Do not exercise so much that you hurt yourself, feel dizzy, or get very short of breath.  Wear comfortable clothes and wear shoes with good support.  Drink plenty of water while you exercise to prevent dehydration or heat stroke.  Work out until your breathing and your heartbeat get faster. Where to find more information  [place] Department of Health and Human Services: [employer].at  Centers for Disease Control and Prevention (CDC): http://www.wolf.info/ Summary  Exercising regularly is important. It will improve your overall fitness, flexibility, and endurance.  Regular exercise also will improve your overall health. It can help you control your weight, reduce stress, and improve your bone density.  Do not exercise so much that you hurt yourself, feel dizzy, or get very short of breath.  Before you start a new exercise program, talk with your health care provider. This information is not intended to replace advice given to you by your health care provider. Make sure you discuss any questions you have with your health care provider. Document Released: 08/19/2010 Document Revised: 06/29/2017 Document Reviewed: 06/07/2017 Elsevier Patient Education  2020 [employer].

## 2019-07-02 LAB — COMPLETE METABOLIC PANEL WITH GFR
AG Ratio: 1.7 (calc) (ref 1.0–2.5)
ALT: 21 U/L (ref 9–46)
AST: 21 U/L (ref 10–35)
Albumin: 4.5 g/dL (ref 3.6–5.1)
Alkaline phosphatase (APISO): 63 U/L (ref 35–144)
BUN: 20 mg/dL (ref 7–25)
CO2: 26 mmol/L (ref 20–32)
Calcium: 9.5 mg/dL (ref 8.6–10.3)
Chloride: 102 mmol/L (ref 98–110)
Creat: 0.94 mg/dL (ref 0.70–1.18)
GFR, Est African American: 90 mL/min/{1.73_m2} (ref >=60)
GFR, Est Non African American: 78 mL/min/{1.73_m2} (ref >=60)
Globulin: 2.6 g/dL (calc) (ref 1.9–3.7)
Glucose, Bld: 80 mg/dL (ref 65–99)
Potassium: 4.1 mmol/L (ref 3.5–5.3)
Sodium: 139 mmol/L (ref 135–146)
Total Bilirubin: 1.3 mg/dL — ABNORMAL HIGH (ref 0.2–1.2)
Total Protein: 7.1 g/dL (ref 6.1–8.1)

## 2019-07-02 LAB — CBC WITH DIFFERENTIAL/PLATELET
Absolute Monocytes: 421 cells/uL (ref 200–950)
Basophils Absolute: 39 cells/uL (ref 0–200)
Basophils Relative: 0.8 %
Eosinophils Absolute: 98 cells/uL (ref 15–500)
Eosinophils Relative: 2 %
HCT: 44.9 % (ref 38.5–50.0)
Hemoglobin: 15.7 g/dL (ref 13.2–17.1)
Lymphs Abs: 1926 cells/uL (ref 850–3900)
MCH: 33 pg (ref 27.0–33.0)
MCHC: 35 g/dL (ref 32.0–36.0)
MCV: 94.3 fL (ref 80.0–100.0)
MPV: 11 fL (ref 7.5–12.5)
Monocytes Relative: 8.6 %
Neutro Abs: 2416 cells/uL (ref 1500–7800)
Neutrophils Relative %: 49.3 %
Platelets: 138 10*3/uL — ABNORMAL LOW (ref 140–400)
RBC: 4.76 10*6/uL (ref 4.20–5.80)
RDW: 12.4 % (ref 11.0–15.0)
Total Lymphocyte: 39.3 %
WBC: 4.9 10*3/uL (ref 3.8–10.8)

## 2019-07-02 LAB — TSH: TSH: 1.75 mIU/L (ref 0.40–4.50)

## 2019-07-02 LAB — LIPID PANEL
Cholesterol: 181 mg/dL (ref <200)
HDL: 57 mg/dL (ref >=40)
LDL Cholesterol (Calc): 107 mg/dL (calc) — ABNORMAL HIGH
Non-HDL Cholesterol (Calc): 124 mg/dL (calc) (ref <130)
Total CHOL/HDL Ratio: 3.2 (calc) (ref <5.0)
Triglycerides: 80 mg/dL (ref <150)

## 2019-07-02 LAB — MAGNESIUM: Magnesium: 2.1 mg/dL (ref 1.5–2.5)

## 2019-07-20 ENCOUNTER — Other Ambulatory Visit: Payer: Self-pay | Admitting: Internal Medicine

## 2019-07-20 DIAGNOSIS — E782 Mixed hyperlipidemia: Secondary | ICD-10-CM

## 2019-07-20 MED ORDER — PRAVASTATIN SODIUM 40 MG PO TABS: ORAL_TABLET | ORAL | 3 refills | Status: DC

## 2019-08-15 ENCOUNTER — Ambulatory Visit (INDEPENDENT_AMBULATORY_CARE_PROVIDER_SITE_OTHER): Payer: Medicare Other | Admitting: Adult Health

## 2019-08-15 ENCOUNTER — Ambulatory Visit
Admission: RE | Admit: 2019-08-15 | Discharge: 2019-08-15 | Disposition: A | Discharge status: Home / Self Care | Payer: Medicare Other | Source: Ambulatory Visit | Attending: Adult Health | Admitting: Adult Health

## 2019-08-15 ENCOUNTER — Other Ambulatory Visit: Payer: Self-pay | Admitting: Adult Health

## 2019-08-15 ENCOUNTER — Encounter: Payer: Self-pay | Admitting: Adult Health

## 2019-08-15 ENCOUNTER — Other Ambulatory Visit: Payer: Self-pay

## 2019-08-15 VITALS — BP 122/74 | HR 69 | Temp 97.5°F | Wt 231.0 lb

## 2019-08-15 DIAGNOSIS — M25512 Pain in left shoulder: Secondary | ICD-10-CM

## 2019-08-15 DIAGNOSIS — S4992XA Unspecified injury of left shoulder and upper arm, initial encounter: Secondary | ICD-10-CM | POA: Diagnosis not present

## 2019-08-15 MED ORDER — MELOXICAM 15 MG PO TABS: 7.5000 mg | ORAL_TABLET | Freq: Every day | ORAL | 0 refills | Status: DC

## 2019-08-15 NOTE — Patient Instructions (Addendum)
  Mobic/meloxicam with food on your stomach once daily for 2 weeks, then as needed  No other OTC oral pain meds other than tylenol   Can continue biofreeze  Lidocaine patches (sal[place] pas)     Shoulder Pain Many things can cause shoulder pain, including:  An injury to the shoulder.  Overuse of the shoulder.  Arthritis. The source of the pain can be:  Inflammation.  An injury to the shoulder joint.  An injury to a tendon, ligament, or bone. Follow these instructions at home: Pay attention to changes in your symptoms. Let your health care provider know about them. Follow these instructions to relieve your pain. If you have a sling:  Wear the sling as told by your health care provider. Remove it only as told by your health care provider.  Loosen the sling if your fingers tingle, become numb, or turn cold and blue.  Keep the sling clean.  If the sling is not waterproof: ? Do not let it get wet. Remove it to shower or bathe.  Move your arm as little as possible, but keep your hand moving to prevent swelling. Managing pain, stiffness, and swelling   If directed, put ice on the painful area: ? Put ice in a plastic bag. ? Place a towel between your skin and the bag. ? Leave the ice on for 20 minutes, 2-3 times per day. Stop applying ice if it does not help with the pain.  Squeeze a soft ball or a foam pad as much as possible. This helps to keep the shoulder from swelling. It also helps to strengthen the arm. General instructions  Take over-the-counter and prescription medicines only as told by your health care provider.  Keep all follow-up visits as told by your health care provider. This is important. Contact a health care provider if:  Your pain gets worse.  Your pain is not relieved with medicines.  New pain develops in your arm, hand, or fingers. Get help right away if:  Your arm, hand, or fingers: ? Tingle. ? Become numb. ? Become swollen. ? Become  painful. ? Turn white or blue. Summary  Shoulder pain can be caused by an injury, overuse, or arthritis.  Pay attention to changes in your symptoms. Let your health care provider know about them.  This condition may be treated with a sling, ice, and pain medicines.  Contact your health care provider if the pain gets worse or new pain develops. Get help right away if your arm, hand, or fingers tingle or become numb, swollen, or painful.  Keep all follow-up visits as told by your health care provider. This is important. This information is not intended to replace advice given to you by your health care provider. Make sure you discuss any questions you have with your health care provider. Document Revised: 01/29/2018 Document Reviewed: 01/29/2018 Elsevier Patient Education  [place].
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
## 2019-08-26 ENCOUNTER — Telehealth: Payer: Self-pay | Admitting: Adult Health

## 2019-08-26 ENCOUNTER — Other Ambulatory Visit: Payer: Self-pay | Admitting: Adult Health

## 2019-08-26 DIAGNOSIS — M25512 Pain in left shoulder: Secondary | ICD-10-CM

## 2019-08-26 NOTE — Telephone Encounter (Signed)
Patient reports that mobility is still really limited. Pain has somewhat subsided but when moving it a certain way, pain is intense. Patient would prefer if possible to see Dr. Lynann Bologna or Dr. Tamera Punt.

## 2019-08-26 NOTE — Telephone Encounter (Signed)
Patient called to report, Left shoulder not improving. Please advise the next steps you would recommend.

## 2019-08-27 NOTE — Telephone Encounter (Signed)
Thanks, patient has been scheduled w/ Dr Tania Ade 09/10/19.

## 2019-09-10 DIAGNOSIS — M25512 Pain in left shoulder: Secondary | ICD-10-CM | POA: Diagnosis not present

## 2019-09-12 DIAGNOSIS — M25612 Stiffness of left shoulder, not elsewhere classified: Secondary | ICD-10-CM | POA: Diagnosis not present

## 2019-10-08 ENCOUNTER — Encounter: Payer: Self-pay | Admitting: Internal Medicine

## 2019-10-08 ENCOUNTER — Other Ambulatory Visit: Payer: Self-pay

## 2019-10-08 ENCOUNTER — Ambulatory Visit (INDEPENDENT_AMBULATORY_CARE_PROVIDER_SITE_OTHER): Payer: Medicare Other | Admitting: Internal Medicine

## 2019-10-08 VITALS — BP 110/64 | HR 72 | Temp 96.3°F | Resp 16 | Ht 75.0 in | Wt 225.2 lb

## 2019-10-08 DIAGNOSIS — M25612 Stiffness of left shoulder, not elsewhere classified: Secondary | ICD-10-CM | POA: Diagnosis not present

## 2019-10-08 DIAGNOSIS — E559 Vitamin D deficiency, unspecified: Secondary | ICD-10-CM | POA: Diagnosis not present

## 2019-10-08 DIAGNOSIS — I10 Essential (primary) hypertension: Secondary | ICD-10-CM | POA: Diagnosis not present

## 2019-10-08 DIAGNOSIS — Z79899 Other long term (current) drug therapy: Secondary | ICD-10-CM | POA: Diagnosis not present

## 2019-10-08 DIAGNOSIS — R7309 Other abnormal glucose: Secondary | ICD-10-CM

## 2019-10-08 DIAGNOSIS — E782 Mixed hyperlipidemia: Secondary | ICD-10-CM | POA: Diagnosis not present

## 2019-10-08 NOTE — Patient Instructions (Signed)

## 2019-10-08 NOTE — Progress Notes (Signed)
History of Present Illness:       This very nice 79 y.o. MWM presents for 6 month follow up with HTN, HLD, Pre-Diabetes and Vitamin D Deficiency.       Patient is treated for HTN (1990) & BP has been controlled at home. Today's BP is at goal - 110/64. Patient has had no complaints of any cardiac type chest pain, palpitations, dyspnea / orthopnea / PND, dizziness, claudication, or dependent edema.      Hyperlipidemia is controlled with diet & meds. Patient denies myalgias or other med SE's. Last Lipids were not at goal:  Lab Results  Component Value Date   CHOL 168 10/08/2019   HDL 55 10/08/2019   LDLCALC 97 10/08/2019   TRIG 73 10/08/2019   CHOLHDL 3.1 10/08/2019    Also, the patient has history of PreDiabetes (A1c 5.7%  / 2011 and 5.8% / 2012)  and has had no symptoms of reactive hypoglycemia, diabetic polys, paresthesias or visual blurring.  Last A1c was  Normal & at goal:  Lab Results  Component Value Date   HGBA1C 5.2 10/08/2019           Further, the patient also has history of Vitamin D Deficiency and supplements vitamin D without any suspected side-effects. Last vitamin D was at goal:  Lab Results  Component Value Date   VD25OH 83 10/08/2019    Current Outpatient Medications on File Prior to Visit  Medication Sig  . aspirin 81 MG EC tablet Take 81 mg by mouth daily.    . Cholecalciferol (VITAMIN D3) 10000 UNITS capsule Take 10,000 Units by mouth daily.   . enalapril (VASOTEC) 20 MG tablet Take 1 tablet Daily for BP  . gabapentin (NEURONTIN) 300 MG capsule Take 1 to 2 capsules 1 to 2 hours before sleep  . hydrochlorothiazide (HYDRODIURIL) 12.5 MG tablet Take 1 tablet every Morning for BP & Fluid Retention / Ankle Swelling  . latanoprost (XALATAN) 0.005 % ophthalmic solution Place 1 drop into both eyes at bedtime.  . magnesium oxide (MAG-OX) 400 MG tablet Take 400 mg by mouth 2 (two) times daily.   . meloxicam (MOBIC) 15 MG tablet Take 1/2 to 1 tablet Daily with  Food for Pain & Inflammation  . Multiple Vitamin (MULTIVITAMIN) tablet Take 1 tablet by mouth daily.    . Omega-3 Fatty Acids (FISH OIL) 1200 MG CAPS Take 1 capsule by mouth 2 (two) times daily.   . pravastatin (PRAVACHOL) 40 MG tablet Take 1 tablet Bedtime for Cholesterol  . tamsulosin (FLOMAX) 0.4 MG CAPS capsule Take 1 capsul at Bedtime for Prostate  . vitamin C (ASCORBIC ACID) 500 MG tablet Take 500 mg by mouth daily.     No current facility-administered medications on file prior to visit.    Allergies  Allergen Reactions  . Viagra [Sildenafil Citrate]     Headache    PMHx:   Past Medical History:  Diagnosis Date  . BPH (benign prostatic hypertrophy)   . Colon polyps   . Elevated hemoglobin A1c   . Glaucoma   . Hypertension   . Vitamin D deficiency     Immunization History  Administered Date(s) Administered  . Influenza, High Dose Seasonal PF 04/01/2019  . Influenza-Unspecified 04/30/2014  . Pneumococcal Conjugate-13 06/08/2014  . Pneumococcal Polysaccharide-23 08/09/2009  . Td 08/09/2009    Past Surgical History:  Procedure Laterality Date  . CARDIAC CATHETERIZATION  1999  . INGUINAL HERNIA REPAIR    .  LAPAROSCOPIC APPENDECTOMY      FHx:    Reviewed / unchanged  SHx:    Reviewed / unchanged   Systems Review:  Constitutional: Denies fever, chills, wt changes, headaches, insomnia, fatigue, night sweats, change in appetite. Eyes: Denies redness, blurred vision, diplopia, discharge, itchy, watery eyes.  ENT: Denies discharge, congestion, post nasal drip, epistaxis, sore throat, earache, hearing loss, dental pain, tinnitus, vertigo, sinus pain, snoring.  CV: Denies chest pain, palpitations, irregular heartbeat, syncope, dyspnea, diaphoresis, orthopnea, PND, claudication or edema. Respiratory: denies cough, dyspnea, DOE, pleurisy, hoarseness, laryngitis, wheezing.  Gastrointestinal: Denies dysphagia, odynophagia, heartburn, reflux, water brash, abdominal pain or  cramps, nausea, vomiting, bloating, diarrhea, constipation, hematemesis, melena, hematochezia  or hemorrhoids. Genitourinary: Denies dysuria, frequency, urgency, nocturia, hesitancy, discharge, hematuria or flank pain. Musculoskeletal: Denies arthralgias, myalgias, stiffness, jt. swelling, pain, limping or strain/sprain.  Skin: Denies pruritus, rash, hives, warts, acne, eczema or change in skin lesion(s). Neuro: No weakness, tremor, incoordination, spasms, paresthesia or pain. Psychiatric: Denies confusion, memory loss or sensory loss. Endo: Denies change in weight, skin or hair change.  Heme/Lymph: No excessive bleeding, bruising or enlarged lymph nodes.  Physical Exam  BP 110/64   Pulse 72   Temp (!) 96.3 F (35.7 C)   Resp 16   Ht 6\' 3"  (1.905 m)   Wt 225 lb 3.2 oz (102.2 kg)   BMI 28.15 kg/m   Appears  well nourished, well groomed  and in no distress.  Eyes: PERRLA, EOMs, conjunctiva no swelling or erythema. Sinuses: No frontal/maxillary tenderness ENT/Mouth: EAC's clear, TM's nl w/o erythema, bulging. Nares clear w/o erythema, swelling, exudates. Oropharynx clear without erythema or exudates. Oral hygiene is good. Tongue normal, non obstructing. Hearing intact.  Neck: Supple. Thyroid not palpable. Car 2+/2+ without bruits, nodes or JVD. Chest: Respirations nl with BS clear & equal w/o rales, rhonchi, wheezing or stridor.  Cor: Heart sounds normal w/ regular rate and rhythm without sig. murmurs, gallops, clicks or rubs. Peripheral pulses normal and equal  without edema.  Abdomen: Soft & bowel sounds normal. Non-tender w/o guarding, rebound, hernias, masses or organomegaly.  Lymphatics: Unremarkable.  Musculoskeletal: Full ROM all peripheral extremities, joint stability, 5/5 strength and normal gait.  Skin: Warm, dry without exposed rashes, lesions or ecchymosis apparent.  Neuro: Cranial nerves intact, reflexes equal bilaterally. Sensory-motor testing grossly intact. Tendon  reflexes grossly intact.  Pysch: Alert & oriented x 3.  Insight and judgement nl & appropriate. No ideations.  Assessment and Plan:  1. Essential hypertension  - Continue medication, monitor blood pressure at home.  - Continue DASH diet.  Reminder to go to the ER if any CP,  SOB, nausea, dizziness, severe HA, changes vision/speech.  - CBC with Differential/Platelet - COMPLETE METABOLIC PANEL WITH GFR - Magnesium - TSH  2. Hyperlipidemia, mixed  - Continue diet/meds, exercise,& lifestyle modifications.  - Continue monitor periodic cholesterol/liver & renal functions   - Lipid panel - TSH  3. Abnormal glucose  - Hemoglobin A1c - Insulin, random  4. Vitamin D deficiency  - Continue diet, exercise  - Lifestyle modifications.  - Monitor appropriate labs. - Continue supplementation.  - VITAMIN D 25 Hydroxy  5. Prediabetes  - Hemoglobin A1c - Insulin, random  6. Medication management  - CBC with Differential/Platelet - COMPLETE METABOLIC PANEL WITH GFR - Magnesium - Lipid panel - TSH - Hemoglobin A1c - Insulin, random - VITAMIN D 25 Hydroxy         Discussed  regular exercise, BP monitoring,  weight control to achieve/maintain BMI less than 25 and discussed med and SE's. Recommended labs to assess and monitor clinical status with further disposition pending results of labs.  I discussed the assessment and treatment plan with the patient. The patient was provided an opportunity to ask questions and all were answered. The patient agreed with the plan and demonstrated an understanding of the instructions.  I provided over 30 minutes of exam, counseling, chart review and  complex critical decision making.   Kirtland Bouchard, MD

## 2019-10-09 LAB — INSULIN, RANDOM: Insulin: 14.3 u[IU]/mL

## 2019-10-09 LAB — LIPID PANEL
Cholesterol: 168 mg/dL (ref <200)
HDL: 55 mg/dL (ref >=40)
LDL Cholesterol (Calc): 97 mg/dL (calc)
Non-HDL Cholesterol (Calc): 113 mg/dL (calc) (ref <130)
Total CHOL/HDL Ratio: 3.1 (calc) (ref <5.0)
Triglycerides: 73 mg/dL (ref <150)

## 2019-10-09 LAB — COMPLETE METABOLIC PANEL WITH GFR
AG Ratio: 2.2 (calc) (ref 1.0–2.5)
ALT: 28 U/L (ref 9–46)
AST: 22 U/L (ref 10–35)
Albumin: 4.4 g/dL (ref 3.6–5.1)
Alkaline phosphatase (APISO): 55 U/L (ref 35–144)
BUN: 23 mg/dL (ref 7–25)
CO2: 26 mmol/L (ref 20–32)
Calcium: 9.3 mg/dL (ref 8.6–10.3)
Chloride: 106 mmol/L (ref 98–110)
Creat: 1 mg/dL (ref 0.70–1.18)
GFR, Est African American: 84 mL/min/{1.73_m2} (ref >=60)
GFR, Est Non African American: 72 mL/min/{1.73_m2} (ref >=60)
Globulin: 2 g/dL (calc) (ref 1.9–3.7)
Glucose, Bld: 103 mg/dL — ABNORMAL HIGH (ref 65–99)
Potassium: 4.3 mmol/L (ref 3.5–5.3)
Sodium: 139 mmol/L (ref 135–146)
Total Bilirubin: 0.9 mg/dL (ref 0.2–1.2)
Total Protein: 6.4 g/dL (ref 6.1–8.1)

## 2019-10-09 LAB — CBC WITH DIFFERENTIAL/PLATELET
Absolute Monocytes: 400 cells/uL (ref 200–950)
Basophils Absolute: 20 cells/uL (ref 0–200)
Basophils Relative: 0.5 %
Eosinophils Absolute: 100 cells/uL (ref 15–500)
Eosinophils Relative: 2.5 %
HCT: 41.8 % (ref 38.5–50.0)
Hemoglobin: 14.7 g/dL (ref 13.2–17.1)
Lymphs Abs: 1380 cells/uL (ref 850–3900)
MCH: 34.4 pg — ABNORMAL HIGH (ref 27.0–33.0)
MCHC: 35.2 g/dL (ref 32.0–36.0)
MCV: 97.9 fL (ref 80.0–100.0)
MPV: 10.1 fL (ref 7.5–12.5)
Monocytes Relative: 10 %
Neutro Abs: 2100 cells/uL (ref 1500–7800)
Neutrophils Relative %: 52.5 %
Platelets: 135 10*3/uL — ABNORMAL LOW (ref 140–400)
RBC: 4.27 10*6/uL (ref 4.20–5.80)
RDW: 12.9 % (ref 11.0–15.0)
Total Lymphocyte: 34.5 %
WBC: 4 10*3/uL (ref 3.8–10.8)

## 2019-10-09 LAB — VITAMIN D 25 HYDROXY (VIT D DEFICIENCY, FRACTURES): Vit D, 25-Hydroxy: 83 ng/mL (ref 30–100)

## 2019-10-09 LAB — MAGNESIUM: Magnesium: 2.1 mg/dL (ref 1.5–2.5)

## 2019-10-09 LAB — HEMOGLOBIN A1C
Hgb A1c MFr Bld: 5.2 % of total Hgb (ref <5.7)
Mean Plasma Glucose: 103 (calc)
eAG (mmol/L): 5.7 (calc)

## 2019-10-09 LAB — TSH: TSH: 2.05 mIU/L (ref 0.40–4.50)

## 2019-10-11 ENCOUNTER — Encounter: Payer: Self-pay | Admitting: Internal Medicine

## 2019-10-20 DIAGNOSIS — H25013 Cortical age-related cataract, bilateral: Secondary | ICD-10-CM | POA: Diagnosis not present

## 2019-10-20 DIAGNOSIS — H402222 Chronic angle-closure glaucoma, left eye, moderate stage: Secondary | ICD-10-CM | POA: Diagnosis not present

## 2019-10-20 DIAGNOSIS — H2513 Age-related nuclear cataract, bilateral: Secondary | ICD-10-CM | POA: Diagnosis not present

## 2019-10-20 DIAGNOSIS — H402211 Chronic angle-closure glaucoma, right eye, mild stage: Secondary | ICD-10-CM | POA: Diagnosis not present

## 2019-10-21 ENCOUNTER — Encounter: Payer: Self-pay | Admitting: *Deleted

## 2019-10-21 LAB — HM DIABETES EYE EXAM

## 2019-11-05 DIAGNOSIS — M67912 Unspecified disorder of synovium and tendon, left shoulder: Secondary | ICD-10-CM | POA: Diagnosis not present

## 2019-11-18 DIAGNOSIS — M25512 Pain in left shoulder: Secondary | ICD-10-CM | POA: Diagnosis not present

## 2019-11-21 DIAGNOSIS — M75122 Complete rotator cuff tear or rupture of left shoulder, not specified as traumatic: Secondary | ICD-10-CM | POA: Diagnosis not present

## 2019-11-23 ENCOUNTER — Other Ambulatory Visit: Payer: Self-pay | Admitting: Internal Medicine

## 2019-11-23 DIAGNOSIS — N401 Enlarged prostate with lower urinary tract symptoms: Secondary | ICD-10-CM

## 2019-11-23 MED ORDER — TAMSULOSIN HCL 0.4 MG PO CAPS: ORAL_CAPSULE | ORAL | 0 refills | Status: DC

## 2019-12-19 ENCOUNTER — Ambulatory Visit: Payer: Medicare Other | Admitting: Adult Health Nurse Practitioner

## 2019-12-23 ENCOUNTER — Ambulatory Visit: Payer: Medicare Other | Admitting: Adult Health Nurse Practitioner

## 2020-01-09 DIAGNOSIS — Z1211 Encounter for screening for malignant neoplasm of colon: Secondary | ICD-10-CM | POA: Diagnosis not present

## 2020-01-09 DIAGNOSIS — Z1212 Encounter for screening for malignant neoplasm of rectum: Secondary | ICD-10-CM | POA: Diagnosis not present

## 2020-01-27 ENCOUNTER — Ambulatory Visit: Payer: Medicare Other | Admitting: Adult Health Nurse Practitioner

## 2020-01-30 NOTE — Progress Notes (Signed)
MEDICARE ANNUAL WELLNESS VISIT AND FOLLOW UP   Assessment:   [name] was seen today for follow-up and medicare wellness.  Diagnoses and all orders for this visit:  Medicare annual wellness visit, subsequent Yearly  Essential hypertension Continue medication: HCTZ 12.5mg  & enalapril 20mg  Monitor blood pressure at home; call if consistently over 130/80 Continue DASH diet.   Reminder to go to the ER if any CP, SOB, nausea, dizziness, severe HA, changes vision/speech, left arm numbness and tingling and jaw pain.  Hyperlipidemia, mixed Continue medications: Pravachol 40mg  and fish oil Continue low cholesterol diet and exercise.  Check lipid panel.   Abnormal glucose Discussed dietary and exercise modifications Will check A1c and insulin  Vitamin D deficiency Taking supplementation Will check labs  Gastroesophageal reflux disease without esophagitis Doing well on current regiment Monitor diet for triggers  Benign localized prostatic hyperplasia with lower urinary tract symptoms (LUTS) No change in nocturia x1 Continue tamsulosin0.4mg  nightly  Decreased hearing of both ears Discussed audiology referral Does not want hearing aids Discussed evaluation at [facility]  Obesity (BMI 28.0-28.9) Discussed dietary and exercise modifications  Medication management Continued  Glaucoma, unspecified glaucoma type, unspecified laterality Continue Xalatan 1drop both eyes Due for eye exam for 2020   Patient and agrees with plan of care.  We will contact you in 1-3 business days with lab results drawn today.  Follow up in 3 month(s) for complete physical.  Call or return with new or worsening symptoms as discussed in appointment.  May contact office via phone [phone] or [place].   Over 30 minutes of face to face interview, exam, counseling, chart review, and critical decision making was performed  Future Appointments  Date Time Provider [place]  05/12/2020  11:00 AM Unk Pinto, MD [facility] None  02/03/2021 10:30 AM Garnet Sierras, NP [facility] None     Plan:   During the course of the visit the patient was educated and counseled about appropriate screening and preventive services including:    Pneumococcal vaccine   Influenza vaccine  Prevnar 13  Td vaccine  Screening electrocardiogram  Colorectal cancer screening  Diabetes screening  Glaucoma screening  Nutrition counseling    Subjective:  Cody Hamilton is a 79 y.o. male who presents for Medicare Annual Wellness Visit and 3 month follow up for HTN, HLD, BPH, Glaucoma, abnormal glucose, and vitamin D Def.   His blood pressure has not been checking  at home, today their BP is BP: 122/74 He does not workout. He denies chest pain, shortness of breath, dizziness.  He is on cholesterol medication and denies myalgias. His cholesterol is at goal. The cholesterol last visit was:   Lab Results  Component Value Date   CHOL 168 02/03/2020   HDL 50 02/03/2020   LDLCALC 93 02/03/2020   TRIG 158 (H) 02/03/2020   CHOLHDL 3.4 02/03/2020   He has been working on diet and exercise by mean of house and yard work for abnormal glucose, and denies foot ulcerations, paresthesia of the feet, polydipsia, polyuria and visual disturbances. Last A1C in the office was:  Lab Results  Component Value Date   HGBA1C 5.2 10/08/2019   Last GFR Lab Results  Component Value Date   GFRNONAA 70 02/03/2020     Lab Results  Component Value Date   GFRAA 81 02/03/2020   Patient is on Vitamin D supplement.   Lab Results  Component Value Date   VD25OH 83 10/08/2019      BPH Currently he is  symptomatic and denies or endorses nocturia x 1 a night and denies urinary hesitancy, urinary frequency, incomplete voiding, double voiding, weak stream, perineal discomfort, dysuria, hematuria, abdominal pain, flank pain and testicular pain.  He is taking Tamulosin (Flomax). He is not following with  urology at this time and last results were in normal. Lab Results  Component Value Date   PSA 0.4 03/25/2019     Medication Review:   Current Outpatient Medications (Cardiovascular):  .  enalapril (VASOTEC) 20 MG tablet, Take 1 tablet Daily for BP .  hydrochlorothiazide (HYDRODIURIL) 12.5 MG tablet, Take 1 tablet every Morning for BP & Fluid Retention / Ankle Swelling .  pravastatin (PRAVACHOL) 40 MG tablet, Take 1 tablet Bedtime for Cholesterol   Current Outpatient Medications (Analgesics):  .  aspirin 81 MG EC tablet, Take 81 mg by mouth daily.   .  meloxicam (MOBIC) 15 MG tablet, Take 1/2 to 1 tablet Daily with Food for Pain & Inflammation   Current Outpatient Medications (Other):  Marland Kitchen  Cholecalciferol (VITAMIN D3) 10000 UNITS capsule, Take 10,000 Units by mouth daily.  Marland Kitchen  latanoprost (XALATAN) 0.005 % ophthalmic solution, Place 1 drop into both eyes at bedtime. .  magnesium oxide (MAG-OX) 400 MG tablet, Take 400 mg by mouth 2 (two) times daily.  .  Multiple Vitamin (MULTIVITAMIN) tablet, Take 1 tablet by mouth daily.   .  Omega-3 Fatty Acids (FISH OIL) 1200 MG CAPS, Take 1 capsule by mouth 2 (two) times daily.  .  tamsulosin (FLOMAX) 0.4 MG CAPS capsule, Take 1 capsule at Bedtime for Prostate .  vitamin C (ASCORBIC ACID) 500 MG tablet, Take 500 mg by mouth daily.    Allergies: Allergies  Allergen Reactions  . Viagra [Sildenafil Citrate]     Headache    Current Problems (verified) has Hyperlipidemia; Hypertension; Other abnormal glucose; Vitamin D deficiency; Benign prostatic hyperplasia; Glaucoma; DDD (degenerative disc disease), lumbar; Medication management; Overweight (BMI 25.0-29.9); Depression, major, in remission ([place]); Encounter for Medicare annual wellness exam; and Decreased hearing on their problem list.  Screening Tests Immunization History  Administered Date(s) Administered  . Influenza, High Dose Seasonal PF 04/01/2019  . Influenza-Unspecified 04/30/2014  .  Pneumococcal Conjugate-13 06/08/2014  . Pneumococcal Polysaccharide-23 08/09/2009  . Td 08/09/2009    Preventative care: Last colonoscopy: 2019  Prior vaccinations: TD or Tdap: 2011  Influenza: Due for 2020 Pneumococcal: 2011 Prevnar13: 2013 Shingles/Zostavax: Declined   Names of Other Physician/Practitioners you currently use: 1. [facility] here for primary care 2. Eye Exam  Dr Kathlen Mody 2019, DUE 3. Dental Exam Dr Scheryl Darter 2019, DUE  Patient Care Team: Unk Pinto, MD as PCP - General (Internal Medicine) Josue Hector, MD as Consulting Physician (Cardiology) Laurence Spates, MD (Inactive) as Consulting Physician (Gastroenterology)  Surgical: He  has a past surgical history that includes Inguinal hernia repair; Laparoscopic appendectomy; and Cardiac catheterization (1999). Family His family history includes Alzheimer's disease in his mother; Cancer in his father; Hyperlipidemia in his father and sister; Hypertension in his father, mother, and sister; Lymphoma in his father. Social history  He reports that he quit smoking about 18 years ago. He has never used smokeless tobacco. He reports that he does not drink alcohol and does not use drugs.  MEDICARE WELLNESS OBJECTIVES: Physical activity: Current Exercise Habits: Home exercise routine, Time (Minutes): 30, Frequency (Times/Week): 7, Weekly Exercise (Minutes/Week): 210, Intensity: Moderate, Exercise limited by: None identified Cardiac risk factors: Cardiac Risk Factors include: advanced age (>50men, >31  women);dyslipidemia;male gender;hypertension Depression/mood screen:   Depression screen [facility] 2/9 02/03/2020  Decreased Interest 0  Down, Depressed, Hopeless 0  PHQ - 2 Score 0    ADLs:  In your present state of health, do you have any difficulty performing the following activities: 02/03/2020 10/08/2019  Hearing? N N  Vision? N N  Difficulty concentrating or making decisions? N N   Walking or climbing stairs? N N  Dressing or bathing? N N  Doing errands, shopping? N N  Preparing Food and eating ? N -  Using the Toilet? N -  In the past six months, have you accidently leaked urine? N -  Do you have problems with loss of bowel control? N -  Managing your Medications? N -  Managing your Finances? N -  Housekeeping or managing your Housekeeping? N -  Some recent data might be hidden     Cognitive Testing  Alert? Yes  Normal Appearance?Yes  Oriented to person? Yes  Place? Yes   Time? Yes  Recall of three objects?  Yes  Can perform simple calculations? Yes  Displays appropriate judgment?Yes  Can read the correct time from a watch face?Yes  EOL planning: Does Patient Have a Medical Advance Directive?: Yes Type of Advance Directive: Healthcare Power of Attorney, Living will Copy of [place] in Chart?: No - copy requested   Objective:   Today's Vitals   02/03/20 1140  BP: 122/74  Pulse: 61  Temp: (!) 97.4 F (36.3 C)  SpO2: 95%  Weight: 230 lb (104.3 kg)  PainSc: 0-No pain   Body mass index is 28.75 kg/m.  General appearance: alert, no distress, WD/WN, male HEENT: normocephalic, sclerae anicteric, TMs pearly, nares patent, no discharge or erythema, pharynx normal Oral cavity: MMM, no lesions Neck: supple, no lymphadenopathy, no thyromegaly, no masses Heart: RRR, normal S1, S2, no murmurs Lungs: CTA bilaterally, no wheezes, rhonchi, or rales Abdomen: +bs, soft, non tender, non distended, no masses, no hepatomegaly, no splenomegaly Musculoskeletal: nontender, no swelling, no obvious deformity Extremities: no edema, no cyanosis, no clubbing Pulses: 2+ symmetric, upper and lower extremities, normal cap refill Neurological: alert, oriented x 3, CN2-12 intact, strength normal upper extremities and lower extremities, sensation normal throughout, DTRs 2+ throughout, no cerebellar signs, gait normal Psychiatric: normal affect, behavior  normal, pleasant   Medicare Attestation I have personally reviewed: The patient's medical and social history Their use of alcohol, tobacco or illicit drugs Their current medications and supplements The patient's functional ability including ADLs,fall risks, home safety risks, cognitive, and hearing and visual impairment Diet and physical activities Evidence for depression or mood disorders  The patient's weight, height, BMI, and visual acuity have been recorded in the chart.  I have made referrals, counseling, and provided education to the patient based on review of the above and I have provided the patient with a written personalized care plan for preventive services.    Garnet Sierras, NP [facility] 02/05/2020  12:57 PM

## 2020-02-03 ENCOUNTER — Other Ambulatory Visit: Payer: Self-pay

## 2020-02-03 ENCOUNTER — Ambulatory Visit (INDEPENDENT_AMBULATORY_CARE_PROVIDER_SITE_OTHER): Payer: Medicare Other | Admitting: Adult Health Nurse Practitioner

## 2020-02-03 ENCOUNTER — Encounter: Payer: Self-pay | Admitting: Adult Health Nurse Practitioner

## 2020-02-03 VITALS — BP 122/74 | HR 61 | Temp 97.4°F | Wt 230.0 lb

## 2020-02-03 DIAGNOSIS — M51369 Other intervertebral disc degeneration, lumbar region without mention of lumbar back pain or lower extremity pain: Secondary | ICD-10-CM

## 2020-02-03 DIAGNOSIS — R6889 Other general symptoms and signs: Secondary | ICD-10-CM | POA: Diagnosis not present

## 2020-02-03 DIAGNOSIS — Z0000 Encounter for general adult medical examination without abnormal findings: Secondary | ICD-10-CM

## 2020-02-03 DIAGNOSIS — R7309 Other abnormal glucose: Secondary | ICD-10-CM | POA: Diagnosis not present

## 2020-02-03 DIAGNOSIS — Z0001 Encounter for general adult medical examination with abnormal findings: Secondary | ICD-10-CM

## 2020-02-03 DIAGNOSIS — E559 Vitamin D deficiency, unspecified: Secondary | ICD-10-CM

## 2020-02-03 DIAGNOSIS — I10 Essential (primary) hypertension: Secondary | ICD-10-CM

## 2020-02-03 DIAGNOSIS — M5136 Other intervertebral disc degeneration, lumbar region: Secondary | ICD-10-CM | POA: Diagnosis not present

## 2020-02-03 DIAGNOSIS — H9193 Unspecified hearing loss, bilateral: Secondary | ICD-10-CM

## 2020-02-03 DIAGNOSIS — E663 Overweight: Secondary | ICD-10-CM

## 2020-02-03 DIAGNOSIS — Z79899 Other long term (current) drug therapy: Secondary | ICD-10-CM

## 2020-02-03 DIAGNOSIS — E782 Mixed hyperlipidemia: Secondary | ICD-10-CM | POA: Diagnosis not present

## 2020-02-03 DIAGNOSIS — N401 Enlarged prostate with lower urinary tract symptoms: Secondary | ICD-10-CM

## 2020-02-03 DIAGNOSIS — R350 Frequency of micturition: Secondary | ICD-10-CM

## 2020-02-03 DIAGNOSIS — H409 Unspecified glaucoma: Secondary | ICD-10-CM

## 2020-02-03 DIAGNOSIS — F325 Major depressive disorder, single episode, in full remission: Secondary | ICD-10-CM

## 2020-02-04 LAB — LIPID PANEL
Cholesterol: 168 mg/dL (ref <200)
HDL: 50 mg/dL (ref >=40)
LDL Cholesterol (Calc): 93 mg/dL (calc)
Non-HDL Cholesterol (Calc): 118 mg/dL (calc) (ref <130)
Total CHOL/HDL Ratio: 3.4 (calc) (ref <5.0)
Triglycerides: 158 mg/dL — ABNORMAL HIGH (ref <150)

## 2020-02-04 LAB — COMPLETE METABOLIC PANEL WITH GFR
AG Ratio: 2 (calc) (ref 1.0–2.5)
ALT: 19 U/L (ref 9–46)
AST: 20 U/L (ref 10–35)
Albumin: 4.5 g/dL (ref 3.6–5.1)
Alkaline phosphatase (APISO): 58 U/L (ref 35–144)
BUN: 20 mg/dL (ref 7–25)
CO2: 29 mmol/L (ref 20–32)
Calcium: 9.8 mg/dL (ref 8.6–10.3)
Chloride: 103 mmol/L (ref 98–110)
Creat: 1.02 mg/dL (ref 0.70–1.18)
GFR, Est African American: 81 mL/min/{1.73_m2} (ref >=60)
GFR, Est Non African American: 70 mL/min/{1.73_m2} (ref >=60)
Globulin: 2.3 g/dL (calc) (ref 1.9–3.7)
Glucose, Bld: 92 mg/dL (ref 65–99)
Potassium: 4.6 mmol/L (ref 3.5–5.3)
Sodium: 139 mmol/L (ref 135–146)
Total Bilirubin: 0.9 mg/dL (ref 0.2–1.2)
Total Protein: 6.8 g/dL (ref 6.1–8.1)

## 2020-02-04 LAB — CBC WITH DIFFERENTIAL/PLATELET
Absolute Monocytes: 543 cells/uL (ref 200–950)
Basophils Absolute: 40 cells/uL (ref 0–200)
Basophils Relative: 0.6 %
Eosinophils Absolute: 127 cells/uL (ref 15–500)
Eosinophils Relative: 1.9 %
HCT: 44.9 % (ref 38.5–50.0)
Hemoglobin: 15.4 g/dL (ref 13.2–17.1)
Lymphs Abs: 2084 cells/uL (ref 850–3900)
MCH: 33.2 pg — ABNORMAL HIGH (ref 27.0–33.0)
MCHC: 34.3 g/dL (ref 32.0–36.0)
MCV: 96.8 fL (ref 80.0–100.0)
MPV: 10.6 fL (ref 7.5–12.5)
Monocytes Relative: 8.1 %
Neutro Abs: 3906 cells/uL (ref 1500–7800)
Neutrophils Relative %: 58.3 %
Platelets: 141 10*3/uL (ref 140–400)
RBC: 4.64 10*6/uL (ref 4.20–5.80)
RDW: 12.3 % (ref 11.0–15.0)
Total Lymphocyte: 31.1 %
WBC: 6.7 10*3/uL (ref 3.8–10.8)

## 2020-02-21 ENCOUNTER — Other Ambulatory Visit: Payer: Self-pay | Admitting: Internal Medicine

## 2020-02-21 DIAGNOSIS — N401 Enlarged prostate with lower urinary tract symptoms: Secondary | ICD-10-CM

## 2020-03-25 ENCOUNTER — Other Ambulatory Visit: Payer: Self-pay | Admitting: Internal Medicine

## 2020-03-25 DIAGNOSIS — N401 Enlarged prostate with lower urinary tract symptoms: Secondary | ICD-10-CM

## 2020-04-21 ENCOUNTER — Encounter: Payer: Medicare Other | Admitting: Internal Medicine

## 2020-04-29 ENCOUNTER — Other Ambulatory Visit: Payer: Self-pay | Admitting: Internal Medicine

## 2020-04-29 DIAGNOSIS — I10 Essential (primary) hypertension: Secondary | ICD-10-CM

## 2020-05-04 DIAGNOSIS — M7542 Impingement syndrome of left shoulder: Secondary | ICD-10-CM | POA: Diagnosis not present

## 2020-05-04 DIAGNOSIS — M7502 Adhesive capsulitis of left shoulder: Secondary | ICD-10-CM | POA: Diagnosis not present

## 2020-05-04 DIAGNOSIS — M75122 Complete rotator cuff tear or rupture of left shoulder, not specified as traumatic: Secondary | ICD-10-CM | POA: Diagnosis not present

## 2020-05-04 DIAGNOSIS — G8918 Other acute postprocedural pain: Secondary | ICD-10-CM | POA: Diagnosis not present

## 2020-05-11 ENCOUNTER — Encounter: Payer: Self-pay | Admitting: Internal Medicine

## 2020-05-11 DIAGNOSIS — Z9889 Other specified postprocedural states: Secondary | ICD-10-CM | POA: Diagnosis not present

## 2020-05-11 NOTE — Patient Instructions (Signed)

## 2020-05-11 NOTE — Progress Notes (Signed)
Annual  Screening/Preventative Visit  & Comprehensive Evaluation & Examination     This very nice 79 y.o.  MWM presents for a Screening /Preventative Visit & comprehensive evaluation and management of multiple medical co-morbidities.  Patient has been followed for HTN, HLD, Prediabetes and Vitamin D Deficiency.     HTN predates since 74. Patient's BP has been controlled at home.  Today's BP is at goal - 122/82. Patient denies any cardiac symptoms as chest pain, palpitations, shortness of breath, dizziness or ankle swelling.     Patient's hyperlipidemia is controlled with diet and Pravastatin. Patient denies myalgias or other medication SE's. Last lipids were at goal:  Lab Results  Component Value Date   CHOL 168 02/03/2020   HDL 50 02/03/2020   LDLCALC 93 02/03/2020   TRIG 158 (H) 02/03/2020   CHOLHDL 3.4 02/03/2020       Patient has hx/o prediabetes  (A1c 5.7%  /2011 ) and patient denies reactive hypoglycemic symptoms, visual blurring, diabetic polys or paresthesias. Last A1c was Normal & atgoal: Lab Results  Component Value Date   HGBA1C 5.2 10/08/2019        Finally, patient has history of Vitamin D Deficiency ("36" /2008)  and last vitamin D was at goal:  Lab Results  Component Value Date   VD25OH 83 10/08/2019    Current Outpatient Medications on File Prior to Visit  Medication Sig  . aspirin 81 MG EC tablet Take  daily.    Marland Kitchen VITAMIN D 10175 UNITS  Take  daily.   . enalapril  20 MG tablet Take 1 tablet Daily for BP  . hctz12.5 MG tablet TAKE 1 TABLET EVERY MORNING  . XALATAN  ophth soln Place 1 drop into both eyes at bedtime.  . magnesium  400 MG tablet Take  2 times daily.   . meloxicam  15 MG  Take 1/2 to 1 tablet Daily   . Multiple Vitamin  Take 1 tablet  daily.    . Omega-3 FISH OIL 1200 MG  Take 1 capsule 2  times daily.   . pravastatin  40 MG tablet Take 1 tablet Bedtime for Cholesterol  . tamsulosin  0.4 MG CAPS  TAKE 1 CAPSULE AT BEDTIME   . vitamin C  500 MG tablet Take  daily.      Allergies  Allergen Reactions  . Viagra [Sildenafil Citrate]     Headache   Past Medical History:  Diagnosis Date  . BPH (benign prostatic hypertrophy)   . Colon polyps   . Elevated hemoglobin A1c   . Glaucoma   . Hypertension   . Vitamin D deficiency    Health Maintenance  Topic Date Due  . Hepatitis C Screening  Never done  . COVID-19 Vaccine (1) Never done  . TETANUS/TDAP  08/10/2019  . INFLUENZA VACCINE  02/29/2020  . PNA vac Low Risk Adult  Completed   Immunization History  Administered Date(s) Administered  . Influenza, High Dose Seasonal PF 04/01/2019  . Influenza-Unspecified 04/30/2014  . Pneumococcal Conjugate-13 06/08/2014  . Pneumococcal Polysaccharide-23 08/09/2009  . Td 08/09/2009   Last Colon - 09/07/2017 - Dr Arty Baumgartner - recc no F/U due to Age  Past Surgical History:  Procedure Laterality Date  . CARDIAC CATHETERIZATION  1999  . INGUINAL HERNIA REPAIR    . LAPAROSCOPIC APPENDECTOMY     Family History  Problem Relation Age of Onset  . Hypertension Mother   . Alzheimer's disease Mother   . Hyperlipidemia Father   .  Hypertension Father   . Cancer Father        esophagus  . Lymphoma Father   . Hypertension Sister   . Hyperlipidemia Sister    Social History   Socioeconomic History  . Marital status: Married    Spouse name: Mardene Celeste  . Number of children: 2 daughters - 5 GC  Occupational History  . 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 Smoking status: Former Smoker    Quit date: 07/31/2001    Years since quitting: 18.7  . Smokeless tobacco: Never Used  Substance and Sexual Activity  . Alcohol use: No  . Drug use: No  . Sexual activity: Not on file    ROS Constitutional: Denies fever, chills, weight loss/gain, headaches, insomnia,  night sweats or change in appetite. Does c/o fatigue. Eyes: Denies redness, blurred vision, diplopia, discharge, itchy or watery eyes.  ENT: Denies discharge, congestion, post nasal  drip, epistaxis, sore throat, earache, hearing loss, dental pain, Tinnitus, Vertigo, Sinus pain or snoring.  Cardio: Denies chest pain, palpitations, irregular heartbeat, syncope, dyspnea, diaphoresis, orthopnea, PND, claudication or edema Respiratory: denies cough, dyspnea, DOE, pleurisy, hoarseness, laryngitis or wheezing.  Gastrointestinal: Denies dysphagia, heartburn, reflux, water brash, pain, cramps, nausea, vomiting, bloating, diarrhea, constipation, hematemesis, melena, hematochezia, jaundice or hemorrhoids Genitourinary: Denies dysuria, frequency, discharge, hematuria or flank pain. Has urgency, nocturia x 1-2 & occasional hesitancy. Musculoskeletal: Denies arthralgia, myalgia, stiffness, Jt. Swelling, pain, limp or strain/sprain. Denies Falls. Skin: Denies puritis, rash, hives, warts, acne, eczema or change in skin lesion Neuro: No weakness, tremor, incoordination, spasms, paresthesia or pain Psychiatric: Denies confusion, memory loss or sensory loss. Denies Depression. Endocrine: Denies change in weight, skin, hair change, nocturia, and paresthesia, diabetic polys, visual blurring or hyper / hypo glycemic episodes.  Heme/Lymph: No excessive bleeding, bruising or enlarged lymph nodes.  Physical Exam  BP 122/82   Pulse 69   Temp (!) 97 F (36.1 C)   Resp 16   Ht 6\' 3"  (1.905 m)   Wt 225 lb (102.1 kg)   SpO2 97%   BMI 28.12 kg/m   General Appearance: Well nourished and well groomed and in no apparent distress.  Eyes: PERRLA, EOMs, conjunctiva no swelling or erythema, normal fundi and vessels. Sinuses: No frontal/maxillary tenderness ENT/Mouth: EACs patent / TMs  nl. Nares clear without erythema, swelling, mucoid exudates. Oral hygiene is good. No erythema, swelling, or exudate. Tongue normal, non-obstructing. Tonsils not swollen or erythematous. Hearing normal.  Neck: Supple, thyroid not palpable. No bruits, nodes or JVD. Respiratory: Respiratory effort normal.  BS equal and  clear bilateral without rales, rhonci, wheezing or stridor. Cardio: Heart sounds are normal with regular rate and rhythm and no murmurs, rubs or gallops. Peripheral pulses are normal and equal bilaterally without edema. No aortic or femoral bruits. Chest: symmetric with normal excursions and percussion.  Abdomen: Soft, with Nl bowel sounds. Nontender, no guarding, rebound, hernias, masses, or organomegaly.  Lymphatics: Non tender without lymphadenopathy.  Musculoskeletal: Full ROM all peripheral extremities, joint stability, 5/5 strength, and normal gait. Skin: Warm and dry without rashes, lesions, cyanosis, clubbing or  ecchymosis.  Neuro: Cranial nerves intact, reflexes equal bilaterally. Normal muscle tone, no cerebellar symptoms. Sensation intact.  Pysch: Alert and oriented X 3 with normal affect, insight and judgment appropriate.   Assessment and Plan  1. Annual Preventative/Screening Exam    2. Essential hypertension  - EKG 12-Lead - [place], RETROPERITNL ABD,  LTD - Urinalysis, Routine w reflex microscopic - Microalbumin / creatinine  urine ratio - CBC with Differential/Platelet - COMPLETE METABOLIC PANEL WITH GFR - Magnesium - TSH  3. Hyperlipidemia, mixed  - EKG 12-Lead - [place], RETROPERITNL ABD,  LTD - Lipid panel - TSH  4. Abnormal glucose  - EKG 12-Lead - [place], RETROPERITNL ABD,  LTD - Hemoglobin A1c - Insulin, random  5. Vitamin D deficiency  - VITAMIN D 25 Hydroxy   6. Prediabetes  - EKG 12-Lead - [place], RETROPERITNL ABD,  LTD  7. Benign localized prostatic hyperplasia with  lower urinary tract symptoms (LUTS)  - PSA  8. Prostate cancer screening  - PSA  9. Screening for colorectal cancer  - POC Hemoccult Bld/Stle  10. Screening for ischemic heart disease  - EKG 12-Lead  11. FH: hypertension  - EKG 12-Lead - [place], RETROPERITNL ABD,  LTD  12. Former smoker  - EKG 12-Lead - [place], RETROPERITNL ABD,  LTD  13. Screening for AAA (aortic abdominal  aneurysm)  - [place], RETROPERITNL ABD,  LTD  14. Medication management  - Urinalysis, Routine w reflex microscopic - Microalbumin / creatinine urine ratio - CBC with Differential/Platelet - COMPLETE METABOLIC PANEL WITH GFR - Magnesium - Lipid panel - TSH - Hemoglobin A1c - Insulin, random - VITAMIN D 25 Hydroxy          Patient was counseled in prudent diet, weight control to achieve/maintain BMI less than 25, BP monitoring, regular exercise and medications as discussed.  Discussed med effects and SE's. Routine screening labs and tests as requested with regular follow-up as recommended. Over 40 minutes of exam, counseling, chart review and high complex critical decision making was performed   Kirtland Bouchard, MD

## 2020-05-12 ENCOUNTER — Other Ambulatory Visit: Payer: Self-pay

## 2020-05-12 ENCOUNTER — Ambulatory Visit (INDEPENDENT_AMBULATORY_CARE_PROVIDER_SITE_OTHER): Payer: Medicare Other | Admitting: Internal Medicine

## 2020-05-12 VITALS — BP 122/82 | HR 69 | Temp 97.0°F | Resp 16 | Ht 75.0 in | Wt 225.0 lb

## 2020-05-12 DIAGNOSIS — E559 Vitamin D deficiency, unspecified: Secondary | ICD-10-CM

## 2020-05-12 DIAGNOSIS — Z0000 Encounter for general adult medical examination without abnormal findings: Secondary | ICD-10-CM

## 2020-05-12 DIAGNOSIS — Z8249 Family history of ischemic heart disease and other diseases of the circulatory system: Secondary | ICD-10-CM | POA: Diagnosis not present

## 2020-05-12 DIAGNOSIS — Z0001 Encounter for general adult medical examination with abnormal findings: Secondary | ICD-10-CM

## 2020-05-12 DIAGNOSIS — Z87891 Personal history of nicotine dependence: Secondary | ICD-10-CM

## 2020-05-12 DIAGNOSIS — Z136 Encounter for screening for cardiovascular disorders: Secondary | ICD-10-CM

## 2020-05-12 DIAGNOSIS — E782 Mixed hyperlipidemia: Secondary | ICD-10-CM | POA: Diagnosis not present

## 2020-05-12 DIAGNOSIS — Z1212 Encounter for screening for malignant neoplasm of rectum: Secondary | ICD-10-CM

## 2020-05-12 DIAGNOSIS — I10 Essential (primary) hypertension: Secondary | ICD-10-CM

## 2020-05-12 DIAGNOSIS — Z79899 Other long term (current) drug therapy: Secondary | ICD-10-CM

## 2020-05-12 DIAGNOSIS — Z125 Encounter for screening for malignant neoplasm of prostate: Secondary | ICD-10-CM

## 2020-05-12 DIAGNOSIS — N401 Enlarged prostate with lower urinary tract symptoms: Secondary | ICD-10-CM

## 2020-05-12 DIAGNOSIS — R7309 Other abnormal glucose: Secondary | ICD-10-CM | POA: Diagnosis not present

## 2020-05-12 DIAGNOSIS — Z1211 Encounter for screening for malignant neoplasm of colon: Secondary | ICD-10-CM

## 2020-05-13 LAB — CBC WITH DIFFERENTIAL/PLATELET
Absolute Monocytes: 706 cells/uL (ref 200–950)
Basophils Absolute: 42 cells/uL (ref 0–200)
Basophils Relative: 0.5 %
Eosinophils Absolute: 133 cells/uL (ref 15–500)
Eosinophils Relative: 1.6 %
HCT: 44 % (ref 38.5–50.0)
Hemoglobin: 15.3 g/dL (ref 13.2–17.1)
Lymphs Abs: 2000 cells/uL (ref 850–3900)
MCH: 33.8 pg — ABNORMAL HIGH (ref 27.0–33.0)
MCHC: 34.8 g/dL (ref 32.0–36.0)
MCV: 97.3 fL (ref 80.0–100.0)
MPV: 10.7 fL (ref 7.5–12.5)
Monocytes Relative: 8.5 %
Neutro Abs: 5420 cells/uL (ref 1500–7800)
Neutrophils Relative %: 65.3 %
Platelets: 159 10*3/uL (ref 140–400)
RBC: 4.52 10*6/uL (ref 4.20–5.80)
RDW: 12.6 % (ref 11.0–15.0)
Total Lymphocyte: 24.1 %
WBC: 8.3 10*3/uL (ref 3.8–10.8)

## 2020-05-13 LAB — COMPLETE METABOLIC PANEL WITH GFR
AG Ratio: 1.9 (calc) (ref 1.0–2.5)
ALT: 24 U/L (ref 9–46)
AST: 16 U/L (ref 10–35)
Albumin: 4.4 g/dL (ref 3.6–5.1)
Alkaline phosphatase (APISO): 59 U/L (ref 35–144)
BUN: 23 mg/dL (ref 7–25)
CO2: 30 mmol/L (ref 20–32)
Calcium: 9.6 mg/dL (ref 8.6–10.3)
Chloride: 101 mmol/L (ref 98–110)
Creat: 1.1 mg/dL (ref 0.70–1.18)
GFR, Est African American: 74 mL/min/{1.73_m2} (ref >=60)
GFR, Est Non African American: 64 mL/min/{1.73_m2} (ref >=60)
Globulin: 2.3 g/dL (calc) (ref 1.9–3.7)
Glucose, Bld: 96 mg/dL (ref 65–99)
Potassium: 5 mmol/L (ref 3.5–5.3)
Sodium: 138 mmol/L (ref 135–146)
Total Bilirubin: 1.1 mg/dL (ref 0.2–1.2)
Total Protein: 6.7 g/dL (ref 6.1–8.1)

## 2020-05-13 LAB — URINALYSIS, ROUTINE W REFLEX MICROSCOPIC
Bacteria, UA: NONE SEEN /HPF
Bilirubin Urine: NEGATIVE
Glucose, UA: NEGATIVE
Hgb urine dipstick: NEGATIVE
Hyaline Cast: NONE SEEN /LPF
Ketones, ur: NEGATIVE
Nitrite: NEGATIVE
Protein, ur: NEGATIVE
Specific Gravity, Urine: 1.019 (ref 1.001–1.03)
Squamous Epithelial / HPF: NONE SEEN /HPF (ref <=5)
pH: 5.5 (ref 5.0–8.0)

## 2020-05-13 LAB — HEMOGLOBIN A1C
Hgb A1c MFr Bld: 5.3 % of total Hgb (ref <5.7)
Mean Plasma Glucose: 105 (calc)
eAG (mmol/L): 5.8 (calc)

## 2020-05-13 LAB — LIPID PANEL
Cholesterol: 153 mg/dL (ref <200)
HDL: 51 mg/dL (ref >=40)
LDL Cholesterol (Calc): 78 mg/dL (calc)
Non-HDL Cholesterol (Calc): 102 mg/dL (calc) (ref <130)
Total CHOL/HDL Ratio: 3 (calc) (ref <5.0)
Triglycerides: 144 mg/dL (ref <150)

## 2020-05-13 LAB — INSULIN, RANDOM: Insulin: 15.6 u[IU]/mL

## 2020-05-13 LAB — MICROALBUMIN / CREATININE URINE RATIO
Creatinine, Urine: 109 mg/dL (ref 20–320)
Microalb Creat Ratio: 4 mcg/mg creat (ref <30)
Microalb, Ur: 0.4 mg/dL

## 2020-05-13 LAB — VITAMIN D 25 HYDROXY (VIT D DEFICIENCY, FRACTURES): Vit D, 25-Hydroxy: 82 ng/mL (ref 30–100)

## 2020-05-13 LAB — PSA: PSA: 1.19 ng/mL (ref <=4.0)

## 2020-05-13 LAB — TSH: TSH: 1.53 mIU/L (ref 0.40–4.50)

## 2020-05-13 LAB — MAGNESIUM: Magnesium: 2.2 mg/dL (ref 1.5–2.5)

## 2020-05-20 DIAGNOSIS — H402222 Chronic angle-closure glaucoma, left eye, moderate stage: Secondary | ICD-10-CM | POA: Diagnosis not present

## 2020-05-20 DIAGNOSIS — Z1211 Encounter for screening for malignant neoplasm of colon: Secondary | ICD-10-CM | POA: Diagnosis not present

## 2020-05-20 DIAGNOSIS — Z1212 Encounter for screening for malignant neoplasm of rectum: Secondary | ICD-10-CM | POA: Diagnosis not present

## 2020-05-20 DIAGNOSIS — H402211 Chronic angle-closure glaucoma, right eye, mild stage: Secondary | ICD-10-CM | POA: Diagnosis not present

## 2020-05-21 ENCOUNTER — Other Ambulatory Visit: Payer: Self-pay

## 2020-05-21 DIAGNOSIS — Z1211 Encounter for screening for malignant neoplasm of colon: Secondary | ICD-10-CM

## 2020-05-21 LAB — POC HEMOCCULT BLD/STL (HOME/3-CARD/SCREEN)
Card #2 Fecal Occult Blod, POC: NEGATIVE
Card #3 Fecal Occult Blood, POC: NEGATIVE
Fecal Occult Blood, POC: NEGATIVE

## 2020-06-14 ENCOUNTER — Ambulatory Visit (INDEPENDENT_AMBULATORY_CARE_PROVIDER_SITE_OTHER): Payer: Medicare Other | Admitting: Adult Health

## 2020-06-14 ENCOUNTER — Other Ambulatory Visit: Payer: Self-pay

## 2020-06-14 ENCOUNTER — Encounter: Payer: Self-pay | Admitting: Adult Health

## 2020-06-14 VITALS — BP 114/68 | HR 67 | Temp 97.3°F | Wt 232.0 lb

## 2020-06-14 DIAGNOSIS — H6982 Other specified disorders of Eustachian tube, left ear: Secondary | ICD-10-CM

## 2020-06-14 MED ORDER — PREDNISONE 20 MG PO TABS: ORAL_TABLET | ORAL | 0 refills | Status: DC

## 2020-06-14 NOTE — Patient Instructions (Signed)
Your ears and sinuses are connected by the eustachian tube. When your sinuses are inflamed, this can close off the tube and cause fluid to collect in your middle ear. This can then cause dizziness, popping, clicking, ringing, and echoing in your ears. This is often NOT an infection and does NOT require antibiotics, it is caused by inflammation so the treatments help the inflammation. This can take a long time to get better so please be patient.  Here are things you can do to help with this: - Try the Flonase or Nasonex. Remember to spray each nostril twice towards the outer part of your eye.  Do not sniff but instead pinch your nose and tilt your head back to help the medicine get into your sinuses.  The best time to do this is at bedtime.Stop if you get blurred vision or nose bleeds.  -While drinking fluids, pinch and hold nose close and swallow, to help open eustachian tubes to drain fluid behind ear drums. -Please pick one of the over the counter allergy medications below and take it once daily for allergies.  It will also help with fluid behind ear drums. Claritin or loratadine cheapest but likely the weakest  Zyrtec or certizine at night because it can make you sleepy The strongest is allegra or fexafinadine  Cheapest at [facility], [facility], [facility] -can use decongestant over the counter, please do not use if you have high blood pressure or certain heart conditions.   if worsening HA, changes vision/speech, imbalance, weakness go to the ER   

## 2020-06-14 NOTE — Progress Notes (Signed)
Assessment and Plan:  Maximillian was seen today for ear problem.  Diagnoses and all orders for this visit:  Dysfunction of left eustachian tube -Continue Allergy pill,  autoinflation, explained no need for ABX at this time.  Will give prednisone taper, then can transition to flonase if recurrent sx - if not better 2-4 weeks will refer to ENT - Follow up sooner if any new concerning sx;  -     predniSONE (DELTASONE) 20 MG tablet; 2 tablets daily for 3 days, 1 tablet daily for 4 days.  Further disposition pending results of labs. Discussed med's effects and SE's.   Over 15 minutes of exam, counseling, chart review, and critical decision making was performed.   Future Appointments  Date Time Provider [place]  08/12/2020  8:45 AM Liane Comber, NP [facility] None  11/23/2020  9:30 AM Unk Pinto, MD [facility] None  02/03/2021 10:30 AM Garnet Sierras, NP [facility] None  05/30/2021 11:00 AM Unk Pinto, MD [facility] None    ------------------------------------------------------------------------------------------------------------------   HPI BP 114/68   Pulse 67   Temp (!) 97.3 F (36.3 C)   Wt 232 lb (105.2 kg)   SpO2 97%   BMI 29.00 kg/m   79 y.o.male presents for evaluation of sense of fullness of left ear.   He reports had moderna booster on 06/03/2020, later that weekend developed sense of fullness in the L ear. "like it needs to pop." Denies ear pain, discharge, fever/chills. 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 Denies changes in hearing (has been evaluated, has been recommended hearing aid but hasn't wanted to pursue, no limitations in day to day activities).   Denies nasal congestion, sore throat, sneezing, watery itchy eyes, cough, dyspnea, changes in sense of taste or smell. 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 Hasn't tried decongestants or flonase.   [place] and just wants to feel better quickly.   Past Medical History:  Diagnosis Date  . BPH (benign prostatic hypertrophy)   . Colon polyps   . Elevated hemoglobin A1c   . Glaucoma   . Hypertension   . Vitamin D deficiency      Allergies  Allergen Reactions  . Viagra [Sildenafil Citrate]     Headache    Current Outpatient Medications on File Prior to Visit  Medication Sig  . aspirin 81 MG EC tablet Take 81 mg by mouth daily.    . Cholecalciferol (VITAMIN D3) 10000 UNITS capsule Take 10,000 Units by mouth daily.   . enalapril (VASOTEC) 20 MG tablet Take 1 tablet Daily for BP  . hydrochlorothiazide (HYDRODIURIL) 12.5 MG tablet TAKE 1 TABLET BY MOUTH EVERY MORNING FOR BLOOD PRESSURE AND FLUID RETENTION/ ANKLE SWELLING.  . latanoprost (XALATAN) 0.005 % ophthalmic solution Place 1 drop into both eyes at bedtime.  . magnesium oxide (MAG-OX) 400 MG tablet Take 400 mg by mouth 2 (two) times daily.   . meloxicam (MOBIC) 15 MG tablet Take 1/2 to 1 tablet Daily with Food for Pain & Inflammation  . Multiple Vitamin (MULTIVITAMIN) tablet Take 1 tablet by mouth daily.    . Omega-3 Fatty Acids (FISH OIL) 1200 MG CAPS Take 1 capsule by mouth 2 (two) times daily.   . pravastatin (PRAVACHOL) 40 MG tablet Take 1 tablet Bedtime for Cholesterol  . tamsulosin (FLOMAX) 0.4 MG CAPS capsule TAKE 1 CAPSULE BY MOUTH AT BEDTIME FOR PROSTATE  . vitamin C (ASCORBIC  ACID) 500 MG tablet Take 500 mg by mouth daily.     No current facility-administered medications on file prior to visit.    ROS: all negative except above.   Physical Exam:  BP 114/68   Pulse 67   Temp (!) 97.3 F (36.3 C)   Wt 232 lb (105.2 kg)   SpO2 97%   BMI 29.00 kg/m   General Appearance: Well nourished, in no apparent distress. Eyes: PERRLA, EOMs, conjunctiva no swelling or erythema Sinuses: No Frontal/maxillary tenderness ENT/Mouth: Ext aud canals clear, TMs without erythema, bulging. No erythema, swelling, or  exudate on post pharynx.  Tonsils not swollen or erythematous. Mildly HOH with mask in place.  Neck: Supple, thyroid normal.   Respiratory: Respiratory effort normal, BS equal bilaterally without rales, rhonchi, wheezing or stridor.  Cardio: RRR with no MRGs. Brisk peripheral pulses without edema.  Lymphatics: Non tender without lymphadenopathy.  Musculoskeletal: normal gait.  Skin: Warm, dry without rashes, lesions, ecchymosis.  Neuro: Cranial nerves intact. Normal muscle tone Psych: Awake and oriented X 3, normal affect, Insight and Judgment appropriate.     Izora Ribas, NP 3:53 PM [facility]

## 2020-07-29 ENCOUNTER — Other Ambulatory Visit: Payer: Self-pay | Admitting: Internal Medicine

## 2020-08-11 NOTE — Progress Notes (Unsigned)
FOLLOW UP  Assessment and Plan:   Hypertension At goal with current regimen; continue medications  Monitor blood pressure at home; patient to call if consistently greater than 130/80 Continue DASH diet.   Reminder to go to the ER if any CP, SOB, nausea, dizziness, severe HA, changes vision/speech, left arm numbness and tingling and jaw pain.  Cholesterol Currently at goal; continue statin therapy  Continue low cholesterol diet and exercise.  Check lipid panel.   Other abnormal glucose Recent A1Cs at goal -  Discussed diet/exercise, weight management  Defer A1C; monitor weight and serum glucose via CMP  Overweight (BMI 29) with co morbidities Long discussion about weight loss, diet, and exercise -  Recommended diet heavy in fruits and veggies and low in animal meats, cheeses, and dairy products, appropriate calorie intake Discussed ideal weight for height  Patient will work on continue with low carb diet, portion control  Try keeping food log short term for accountability  discussed importance of exercise and to try starting walking 15 min~30 min daily for cardiovascular health and memory Will follow up in 3 months  Vitamin D Def At goal at last visit; continue supplementation to maintain goal of 70-100 Defer Vit D level  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 Further disposition pending results of labs. Discussed med's effects and SE's.   Over 30 minutes of exam, counseling, chart review, and critical decision making was performed.   Future Appointments  Date Time Provider [place]  11/23/2020  9:30 AM Unk Pinto, MD [facility] None  02/03/2021 10:30 AM  Garnet Sierras, NP [facility] None  05/30/2021 11:00 AM Unk Pinto, MD [facility] None    ----------------------------------------------------------------------------------------------------------------------  HPI 80 y.o. male  presents for 3 month follow up on hypertension, cholesterol, glucose management, weight, depression/anxiety and vitamin D deficiency.   He takes meloxicam PRN for L shoulder pain, s/p rotator cuff repair, reports takes this occasionally only.   he has a diagnosis of depression/anxiety recently in remission off of medication   he currently uses gabapentin 300 mg at night for sleep; previous xanax was tapered to d/c'd successfully in 2019.   BMI is Body mass index is 29.5 kg/m., he has been working on diet and exercise- he has cut out sugar, high fructose corn syrup, white starches, but admits not as good over the holidays and has lost 0 lb since last visit. Likes to fish, more active during the summer, admits fairly sedentary at this time He walks occasionally, is active  Trying to get down to 220 lb -  Wt Readings from Last 3 Encounters:  08/12/20 236 lb (107 kg)  06/14/20 232 lb (105.2 kg)  05/12/20 225 lb (102.1 kg)   His blood pressure has been controlled at home (120s/70s), today their BP is BP: 130/78.   He does not workout - picks up during the summer. He denies chest pain, shortness of breath, dizziness.   He is on cholesterol medication (pravastatin 20 mg daily) and denies myalgias. His cholesterol is at goal. The cholesterol last visit was:   Lab Results  Component Value Date   CHOL 153 05/12/2020   HDL 51 05/12/2020   LDLCALC 78 05/12/2020   TRIG 144 05/12/2020   CHOLHDL 3.0 05/12/2020  He has been working on diet and exercise for glucose management, and denies increased appetite, nausea, paresthesia of the feet, polydipsia, polyuria, visual disturbances, vomiting and weight loss. 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 aspirin 81 MG EC tablet Take 81 mg by mouth daily.  . Cholecalciferol (VITAMIN D3) 10000 UNITS capsule Take 10,000 Units by mouth daily.  . enalapril (VASOTEC) 20 MG tablet Take 1 tablet Daily for BP  . hydrochlorothiazide (HYDRODIURIL) 12.5 MG tablet TAKE 1 TABLET BY MOUTH EVERY MORNING FOR BLOOD PRESSURE AND FLUID RETENTION/ ANKLE SWELLING.  . latanoprost (XALATAN) 0.005 % ophthalmic solution Place 1 drop into both eyes at bedtime.  . magnesium oxide (MAG-OX) 400 MG tablet Take 400 mg by mouth 2 (two) times daily.  . meloxicam (MOBIC) 15 MG tablet TAKE 1/2 TO 1 TABLET BY MOUTH DAILY WITH FOOD FOR PAIN AND INFLAMMATION (Patient taking differently: as needed. TAKE 1/2 TO 1 TABLET BY MOUTH DAILY WITH FOOD FOR PAIN AND INFLAMMATION)  . Multiple Vitamin (MULTIVITAMIN) tablet Take 1 tablet by mouth daily.  . Omega-3 Fatty Acids (FISH OIL) 1200 MG CAPS Take 1 capsule by mouth 2 (two) times daily.  . pravastatin (PRAVACHOL) 40 MG tablet Take 1 tablet Bedtime for Cholesterol  . tamsulosin (FLOMAX) 0.4 MG CAPS capsule TAKE 1 CAPSULE BY MOUTH AT BEDTIME FOR PROSTATE  . vitamin C (ASCORBIC ACID) 500 MG tablet Take 500 mg by mouth daily.   No current facility-administered medications on file prior to visit.     Allergies:  Allergies  Allergen Reactions  . Viagra [Sildenafil Citrate]     Headache     Medical History:  Past Medical History:  Diagnosis Date  . BPH (benign prostatic hypertrophy)   . Colon polyps   . Elevated hemoglobin A1c   . Glaucoma   . Hypertension   . Vitamin D deficiency    Family history-  Reviewed and unchanged Social history- Reviewed and unchanged   Review of Systems:  Review of Systems  Constitutional: Negative for malaise/fatigue and weight loss.  HENT: Negative for hearing loss and tinnitus.   Eyes: Negative for blurred vision and double vision.  Respiratory: Negative for cough, shortness of breath and wheezing.   Cardiovascular: Negative for chest pain, palpitations, orthopnea, claudication and leg swelling.  Gastrointestinal: Negative for abdominal pain, blood in stool, constipation, diarrhea, heartburn, melena, nausea and vomiting.  Genitourinary: Negative.   Musculoskeletal: Negative for joint pain and myalgias.  Skin: Negative for rash.  Neurological: Negative for dizziness, tingling, sensory change, weakness and headaches.  Endo/Heme/Allergies: Negative for polydipsia.  Psychiatric/Behavioral: Negative.  Negative for depression, substance abuse and suicidal ideas. The patient is not nervous/anxious and does not have insomnia.   All other systems reviewed and are negative.     Physical Exam: BP 130/78   Pulse 65   Temp (!) 97.5 F (36.4 C)   Wt 236 lb (107 kg)   SpO2 95%   BMI 29.50 kg/m  Wt Readings from Last 3 Encounters:  08/12/20  236 lb (107 kg)  06/14/20 232 lb (105.2 kg)  05/12/20 225 lb (102.1 kg)   General Appearance: Well nourished, in no apparent distress. Eyes: PERRLA, EOMs, conjunctiva no swelling or erythema Sinuses: No Frontal/maxillary tenderness ENT/Mouth: Ext aud canals clear, TMs without erythema, bulging. No erythema, swelling, or exudate on post pharynx.  Tonsils not swollen or erythematous. Mildly HOH.   Neck: Supple, thyroid normal.  Respiratory: Respiratory effort normal, BS equal bilaterally without rales, rhonchi, wheezing or stridor.  Cardio: RRR with no MRGs. Brisk peripheral pulses without edema.  Abdomen: Soft, + BS.  Non tender, no guarding, rebound, hernias, masses. Lymphatics: Non tender without lymphadenopathy.   Musculoskeletal: Full ROM, 5/5 strength, Normal gait Skin: Warm, dry without rashes, lesions, ecchymosis.  Neuro: Cranial nerves intact. No cerebellar symptoms.  Psych: Awake and oriented X 3, normal affect, Insight and Judgment appropriate.    Izora Ribas, NP 8:45 AM [facility]

## 2020-08-12 ENCOUNTER — Other Ambulatory Visit: Payer: Self-pay

## 2020-08-12 ENCOUNTER — Encounter: Payer: Self-pay | Admitting: Adult Health

## 2020-08-12 ENCOUNTER — Ambulatory Visit (INDEPENDENT_AMBULATORY_CARE_PROVIDER_SITE_OTHER): Payer: Medicare Other | Admitting: Adult Health

## 2020-08-12 VITALS — BP 130/78 | HR 65 | Temp 97.5°F | Wt 236.0 lb

## 2020-08-12 DIAGNOSIS — E782 Mixed hyperlipidemia: Secondary | ICD-10-CM

## 2020-08-12 DIAGNOSIS — F325 Major depressive disorder, single episode, in full remission: Secondary | ICD-10-CM

## 2020-08-12 DIAGNOSIS — I10 Essential (primary) hypertension: Secondary | ICD-10-CM | POA: Diagnosis not present

## 2020-08-12 DIAGNOSIS — R7309 Other abnormal glucose: Secondary | ICD-10-CM

## 2020-08-12 DIAGNOSIS — E559 Vitamin D deficiency, unspecified: Secondary | ICD-10-CM

## 2020-08-12 DIAGNOSIS — Z79899 Other long term (current) drug therapy: Secondary | ICD-10-CM

## 2020-08-12 DIAGNOSIS — E663 Overweight: Secondary | ICD-10-CM

## 2020-08-12 NOTE — Patient Instructions (Signed)
Goals    . Exercise 3x per week (30 min per time)    . Weight (lb) < 220 lb (99.8 kg)     Try keeping a food log Focus on reasonable portions -        Try to focus on increasing good things - high fiber - non-starchy veggies (everything except potatoes, corn) - can have as much as you want  Beans are excellent for healthy heart/gut and weight loss  Try to plan ahead when you "splurge"  Have some sort of accountability/logging system -     High-Fiber Eating Plan Fiber, also called dietary fiber, is a type of carbohydrate. It is found foods such as fruits, vegetables, whole grains, and beans. A high-fiber diet can have many health benefits. Your health care provider may recommend a high-fiber diet to help:  Prevent constipation. Fiber can make your bowel movements more regular.  Lower your cholesterol.  Relieve the following conditions: ? Inflammation of veins in the anus (hemorrhoids). ? Inflammation of specific areas of the digestive tract (uncomplicated diverticulosis). ? A problem of the large intestine, also called the colon, that sometimes causes pain and diarrhea (irritable bowel syndrome, or IBS).  Prevent overeating as part of a weight-loss plan.  Prevent heart disease, type 2 diabetes, and certain cancers. What are tips for following this plan? Reading food labels  Check the nutrition facts label on food products for the amount of dietary fiber. Choose foods that have 5 grams of fiber or more per serving.  The goals for recommended daily fiber intake include: ? Men (age 53 or younger): 34-38 g. ? Men (over age 33): 28-34 g. ? Women (age 18 or younger): 25-28 g. ? Women (over age 50): 22-25 g. Your daily fiber goal is _____________ g.   Shopping  Choose whole fruits and vegetables instead of processed forms, such as apple juice or applesauce.  Choose a wide variety of high-fiber foods such as avocados, lentils, oats, and kidney beans.  Read the nutrition  facts label of the foods you choose. Be aware of foods with added fiber. These foods often have high sugar and sodium amounts per serving. Cooking  Use whole-grain flour for baking and cooking.  Cook with brown rice instead of white rice. Meal planning  Start the day with a breakfast that is high in fiber, such as a cereal that contains 5 g of fiber or more per serving.  Eat breads and cereals that are made with whole-grain flour instead of refined flour or white flour.  Eat brown rice, bulgur wheat, or millet instead of white rice.  Use beans in place of meat in soups, salads, and pasta dishes.  Be sure that half of the grains you eat each day are whole grains. General information  You can get the recommended daily intake of dietary fiber by: ? Eating a variety of fruits, vegetables, grains, nuts, and beans. ? Taking a fiber supplement if you are not able to take in enough fiber in your diet. It is better to get fiber through food than from a supplement.  Gradually increase how much fiber you consume. If you increase your intake of dietary fiber too quickly, you may have bloating, cramping, or gas.  Drink plenty of water to help you digest fiber.  Choose high-fiber snacks, such as berries, raw vegetables, nuts, and popcorn. What foods should I eat? Fruits Berries. Pears. Apples. Oranges. Avocado. Prunes and raisins. Dried figs. Vegetables Sweet potatoes. Spinach. Kale. Artichokes.  Cabbage. Broccoli. Cauliflower. Green peas. Carrots. Squash. Grains Whole-grain breads. Multigrain cereal. Oats and oatmeal. Brown rice. Barley. Bulgur wheat. [place]. Quinoa. Bran muffins. Popcorn. Rye wafer crackers. Meats and other proteins Navy beans, kidney beans, and pinto beans. Soybeans. Split peas. Lentils. Nuts and seeds. Dairy Fiber-fortified yogurt. Beverages Fiber-fortified soy milk. Fiber-fortified orange juice. Other foods Fiber bars. The items listed above may not be a complete  list of recommended foods and beverages. Contact a dietitian for more information. What foods should I avoid? Fruits Fruit juice. Cooked, strained fruit. Vegetables Fried potatoes. Canned vegetables. Well-cooked vegetables. Grains White bread. Pasta made with refined flour. White rice. Meats and other proteins Fatty cuts of meat. Fried chicken or fried fish. Dairy Milk. Yogurt. Cream cheese. Sour cream. Fats and oils Butters. Beverages Soft drinks. Other foods Cakes and pastries. The items listed above may not be a complete list of foods and beverages to avoid. Talk with your dietitian about what choices are best for you. Summary  Fiber is a type of carbohydrate. It is found in foods such as fruits, vegetables, whole grains, and beans.  A high-fiber diet has many benefits. It can help to prevent constipation, lower blood cholesterol, aid weight loss, and reduce your risk of heart disease, diabetes, and certain cancers.  Increase your intake of fiber gradually. Increasing fiber too quickly may cause cramping, bloating, and gas. Drink plenty of water while you increase the amount of fiber you consume.  The best sources of fiber include whole fruits and vegetables, whole grains, nuts, seeds, and beans. This information is not intended to replace advice given to you by your health care provider. Make sure you discuss any questions you have with your health care provider. Document Revised: 11/20/2019 Document Reviewed: 11/20/2019 Elsevier Patient Education  2021 [employer].

## 2020-08-13 LAB — COMPLETE METABOLIC PANEL WITH GFR
AG Ratio: 2 (calc) (ref 1.0–2.5)
ALT: 21 U/L (ref 9–46)
AST: 19 U/L (ref 10–35)
Albumin: 4.5 g/dL (ref 3.6–5.1)
Alkaline phosphatase (APISO): 60 U/L (ref 35–144)
BUN: 19 mg/dL (ref 7–25)
CO2: 27 mmol/L (ref 20–32)
Calcium: 9.5 mg/dL (ref 8.6–10.3)
Chloride: 104 mmol/L (ref 98–110)
Creat: 0.96 mg/dL (ref 0.70–1.18)
GFR, Est African American: 87 mL/min/{1.73_m2} (ref >=60)
GFR, Est Non African American: 75 mL/min/{1.73_m2} (ref >=60)
Globulin: 2.3 g/dL (calc) (ref 1.9–3.7)
Glucose, Bld: 86 mg/dL (ref 65–99)
Potassium: 4.6 mmol/L (ref 3.5–5.3)
Sodium: 141 mmol/L (ref 135–146)
Total Bilirubin: 1 mg/dL (ref 0.2–1.2)
Total Protein: 6.8 g/dL (ref 6.1–8.1)

## 2020-08-13 LAB — LIPID PANEL
Cholesterol: 175 mg/dL (ref <200)
HDL: 48 mg/dL (ref >=40)
LDL Cholesterol (Calc): 97 mg/dL (calc)
Non-HDL Cholesterol (Calc): 127 mg/dL (calc) (ref <130)
Total CHOL/HDL Ratio: 3.6 (calc) (ref <5.0)
Triglycerides: 208 mg/dL — ABNORMAL HIGH (ref <150)

## 2020-08-13 LAB — TSH: TSH: 2.28 mIU/L (ref 0.40–4.50)

## 2020-08-13 LAB — CBC WITH DIFFERENTIAL/PLATELET
Absolute Monocytes: 478 cells/uL (ref 200–950)
Basophils Absolute: 41 cells/uL (ref 0–200)
Basophils Relative: 0.7 %
Eosinophils Absolute: 100 cells/uL (ref 15–500)
Eosinophils Relative: 1.7 %
HCT: 45 % (ref 38.5–50.0)
Hemoglobin: 15.4 g/dL (ref 13.2–17.1)
Lymphs Abs: 1888 cells/uL (ref 850–3900)
MCH: 33 pg (ref 27.0–33.0)
MCHC: 34.2 g/dL (ref 32.0–36.0)
MCV: 96.4 fL (ref 80.0–100.0)
MPV: 10.5 fL (ref 7.5–12.5)
Monocytes Relative: 8.1 %
Neutro Abs: 3393 cells/uL (ref 1500–7800)
Neutrophils Relative %: 57.5 %
Platelets: 160 10*3/uL (ref 140–400)
RBC: 4.67 10*6/uL (ref 4.20–5.80)
RDW: 12.7 % (ref 11.0–15.0)
Total Lymphocyte: 32 %
WBC: 5.9 10*3/uL (ref 3.8–10.8)

## 2020-08-13 LAB — MAGNESIUM: Magnesium: 2.1 mg/dL (ref 1.5–2.5)

## 2020-08-13 NOTE — Progress Notes (Signed)
PATIENT IS AWARE OF LAB RESULTS AND INSTRUCTIONS. -E WELCH

## 2020-10-14 ENCOUNTER — Ambulatory Visit (INDEPENDENT_AMBULATORY_CARE_PROVIDER_SITE_OTHER): Payer: Medicare Other | Admitting: Adult Health Nurse Practitioner

## 2020-10-14 ENCOUNTER — Other Ambulatory Visit: Payer: Self-pay

## 2020-10-14 ENCOUNTER — Encounter: Payer: Self-pay | Admitting: Adult Health Nurse Practitioner

## 2020-10-14 VITALS — BP 136/80 | HR 60 | Temp 97.6°F | Wt 236.0 lb

## 2020-10-14 DIAGNOSIS — M25551 Pain in right hip: Secondary | ICD-10-CM

## 2020-10-14 DIAGNOSIS — I10 Essential (primary) hypertension: Secondary | ICD-10-CM | POA: Diagnosis not present

## 2020-10-14 DIAGNOSIS — M5431 Sciatica, right side: Secondary | ICD-10-CM

## 2020-10-14 MED ORDER — PREDNISONE 10 MG (21) PO TBPK: ORAL_TABLET | Freq: Every day | ORAL | 0 refills | Status: DC

## 2020-10-14 NOTE — Progress Notes (Signed)
Assessment and Plan:  Cody Hamilton was seen today for acute visit and pinch nerve.  Diagnoses and all orders for this visit:  Acute pain of right hip -     predniSONE (STERAPRED UNI-PAK 21 TAB) 10 MG (21) TBPK tablet; Take by mouth daily. Follow directions on Taper pack Trochanteric bursitis? Discussed ice  Sciatic nerve pain Discussed gabapentin 300mg  nightly, may increase to 600mg   Essential hypertension Continue current medications: Monitor blood pressure at home; call if consistently over 130/80 Continue DASH diet.   Reminder to go to the ER if any CP, SOB, nausea, dizziness, severe HA, changes vision/speech, left arm numbness and tingling and jaw pain.     Further disposition pending results of labs. Discussed med's effects and SE's.   Over 30 minutes of face to face interview, exam, counseling, chart review, and critical decision making was performed.   Future Appointments  Date Time Provider [place]  11/23/2020  9:30 AM Unk Pinto, MD [facility] None  02/03/2021 10:30 AM Cody Sierras, NP [facility] None  05/30/2021 11:00 AM Unk Pinto, MD [facility] None    ------------------------------------------------------------------------------------------------------------------   HPI 80 y.o.male presents for evaluation of back pain that started 11 days ago.  He reports he was laying on his back jacking up a boat trailer and working on it.  He has been taking hydrocodone, ibuprofen 600mg  and tylenol 1,000 and also meloxicam.  He reports it is intermittent in nature.  He reports it is constant and shoots all they way down to his foot.  He can not put weight on it and ends up.  The pain wakes him up 9/10 pain throbbing ad when he goes to stand up it really flares.  He is having difficulty calming it down.  Once calmed he will be good for a short while and ago to continue on with his usual activities.  Denies any numbness or tingling, back pain, loss of control of  bowel or bladder.  He reports he has not had anything like this before.  Report he does have some gabapentin, previously prescribed for sleep.  Reports it did not help with his sleep.  He has an appointment in 3/29 with Dr Cody Hamilton, ortho.   Past Medical History:  Diagnosis Date  . BPH (benign prostatic hypertrophy)   . Colon polyps   . Elevated hemoglobin A1c   . Glaucoma   . Hypertension   . Vitamin D deficiency      Allergies  Allergen Reactions  . Viagra [Sildenafil Citrate]     Headache    Current Outpatient Medications on File Prior to Visit  Medication Sig  . aspirin 81 MG EC tablet Take 81 mg by mouth daily.  . Cholecalciferol (VITAMIN D3) 10000 UNITS capsule Take 10,000 Units by mouth daily.  . enalapril (VASOTEC) 20 MG tablet Take 1 tablet Daily for BP  . hydrochlorothiazide (HYDRODIURIL) 12.5 MG tablet TAKE 1 TABLET BY MOUTH EVERY MORNING FOR BLOOD PRESSURE AND FLUID RETENTION/ ANKLE SWELLING.  . latanoprost (XALATAN) 0.005 % ophthalmic solution Place 1 drop into both eyes at bedtime.  . magnesium oxide (MAG-OX) 400 MG tablet Take 400 mg by mouth 2 (two) times daily.  . meloxicam (MOBIC) 15 MG tablet TAKE 1/2 TO 1 TABLET BY MOUTH DAILY WITH FOOD FOR PAIN AND INFLAMMATION (Patient taking differently: as needed. TAKE 1/2 TO 1 TABLET BY MOUTH DAILY WITH FOOD FOR PAIN AND INFLAMMATION)  . Multiple Vitamin (MULTIVITAMIN) tablet Take 1 tablet by mouth daily.  . Omega-3 Fatty  Acids (FISH OIL) 1200 MG CAPS Take 1 capsule by mouth 2 (two) times daily.  . pravastatin (PRAVACHOL) 40 MG tablet Take 1 tablet Bedtime for Cholesterol  . tamsulosin (FLOMAX) 0.4 MG CAPS capsule TAKE 1 CAPSULE BY MOUTH AT BEDTIME FOR PROSTATE  . vitamin C (ASCORBIC ACID) 500 MG tablet Take 500 mg by mouth daily.   No current facility-administered medications on file prior to visit.    ROS: all negative except above.   Physical Exam:  BP 136/80   Pulse 60   Temp 97.6 F (36.4 C)   Wt 236 lb  (107 kg)   SpO2 95%   BMI 29.50 kg/m   General Appearance: Well nourished, in no apparent distress. Eyes: PERRLA, EOMs, conjunctiva no swelling or erythema Respiratory: Respiratory effort normal, BS equal bilaterally without rales, rhonchi, wheezing or stridor.  Cardio: RRR with no MRGs. Brisk peripheral pulses without edema.  Abdomen: Soft, + BS.  Non tender, no guarding, rebound, hernias, masses. Lymphatics: Non tender without lymphadenopathy.  Musculoskeletal: Full ROM, 5/5 strength, normal gait. Point tenderness to greater trochanter, no crepitus or weakness noted, full ROM. Skin: Warm, dry without rashes, lesions, ecchymosis.  Neuro: Cranial nerves intact. Normal muscle tone, no cerebellar symptoms. Sensation intact.  Psych: Awake and oriented X 3, normal affect, Insight and Judgment appropriate.     Cody Hamilton, Cody Jean, DNP [facility] 10/14/2020  2:10 PM

## 2020-10-21 ENCOUNTER — Encounter: Payer: Self-pay | Admitting: Adult Health Nurse Practitioner

## 2020-10-21 ENCOUNTER — Other Ambulatory Visit: Payer: Self-pay | Admitting: Adult Health Nurse Practitioner

## 2020-10-21 DIAGNOSIS — M25551 Pain in right hip: Secondary | ICD-10-CM

## 2020-10-21 MED ORDER — PREDNISONE 10 MG PO TABS: ORAL_TABLET | ORAL | 0 refills | Status: DC

## 2020-10-21 NOTE — Progress Notes (Signed)
Spoke with patient via telephone regarding hip pain from [place]  10/14/20.  Prednisone taper did help with the pain but starting to return.  He is having a hard time sleeping, waking him up at night 2am.  Reports he took 9 tylenol this am as he was in so much pain trying to relive.  Reports his daughter has taken away this mediation from their home.  Discussed dangers of this. Patient agrees.  Sent in extension of prednisone.  Ortho appointment in 5 days. In OV discussed gabapentin use, patient did not try this.  Discussed again at length with patient benefit of gabapentin, agrees to try the medication.  Contact office with new or worsening symptoms.   Garnet Sierras, Laqueta Jean, DNP [facility] 10/21/2020  5:28 PM

## 2020-10-26 DIAGNOSIS — M5416 Radiculopathy, lumbar region: Secondary | ICD-10-CM | POA: Diagnosis not present

## 2020-10-30 DIAGNOSIS — M545 Low back pain, unspecified: Secondary | ICD-10-CM | POA: Diagnosis not present

## 2020-11-03 ENCOUNTER — Other Ambulatory Visit: Payer: Self-pay | Admitting: Internal Medicine

## 2020-11-08 DIAGNOSIS — M5416 Radiculopathy, lumbar region: Secondary | ICD-10-CM | POA: Diagnosis not present

## 2020-11-23 ENCOUNTER — Ambulatory Visit: Payer: Medicare Other | Admitting: Internal Medicine

## 2020-12-01 NOTE — Progress Notes (Signed)
Future Appointments  Date Time Provider [place]  12/02/2020 11:30 AM Unk Pinto, MD [facility] None  02/03/2021 10:30 AM Garnet Sierras, NP [facility] None  05/30/2021 11:00 AM Unk Pinto, MD [facility] None    History of Present Illness:       This very nice 80 y.o. MWM presents for 6 month follow up with HTN, HLD, Pre-Diabetes and Vitamin D Deficiency.        Patient is treated for HTN  (1990) & BP has been controlled at home. Today's BP is at goal - 118/72. Patient has had no complaints of any cardiac type chest pain, palpitations, dyspnea / orthopnea / PND, dizziness, claudication, or dependent edema.       Hyperlipidemia is controlled with diet & Pravastatin. Patient denies myalgias or other med SE's. Last Lipids were at goal except elevated Trig's:  Lab Results  Component Value Date   CHOL 175 08/12/2020   HDL 48 08/12/2020   LDLCALC 97 08/12/2020   TRIG 208 (H) 08/12/2020   CHOLHDL 3.6 08/12/2020     Also, the patient has history of PreDiabetes (A1c 5.7% /2011 )and has had no symptoms of reactive hypoglycemia, diabetic polys, paresthesias or visual blurring.  Last A1c was at goal:  Lab Results  Component Value Date   HGBA1C 5.3 05/12/2020          Further, the patient also has history of Vitamin D Deficiency ("36" /2008) and supplements vitamin D without any suspected side-effects. Last vitamin D was at goal:  Lab Results  Component Value Date   VD25OH 82 05/12/2020     Current Outpatient Medications on File Prior to Visit  Medication Sig  . aspirin 81 MG EC tablet Take daily.  Marland Kitchen VITAMIN D 57846 u Take  daily.  . Enalapril  20 MG tablet Take  1 tablet  Daily  for BP  . hctz 12.5 MG tablet TAKE 1 TABLET  EVERY MORNING   . XALATAN ophth soln Place 1 drop into both eyes at bedtime.  . magnesium  400 MG tablet Take  2 times daily.  . Multiple Vitamin  Take 1 tabletdaily.  . Omega-3 FISH OI 1200 MG C Take 1 capsule  2  times daily.   . pravastatin  40 MG tablet Take 1 tablet Bedtime for Cholesterol  . tamsulosin  0.4 MG CAPS  TAKE 1 CAPSULE AT BEDTIME  . vitamin C  500 MG tablet Take  daily.     Allergies  Allergen Reactions  . Viagra [Sildenafil Citrate]     Headache     PMHx:   Past Medical History:  Diagnosis Date  . BPH (benign prostatic hypertrophy)   . Colon polyps   . Elevated hemoglobin A1c   . Glaucoma   . Hypertension   . Vitamin D deficiency      Immunization History  Administered Date(s) Administered  . Influenza, High Dose Seasonal PF 04/01/2019  . Influenza-Unspecified 04/30/2014, 04/12/2020  . Moderna Sars-Covid-2 Vaccination 10/13/2019, 11/11/2019  . Pneumococcal Conjugate-13 06/08/2014  . Pneumococcal Polysaccharide-23 08/09/2009  . Td 08/09/2009     Past Surgical History:  Procedure Laterality Date  . CARDIAC CATHETERIZATION  1999  . INGUINAL HERNIA REPAIR    . LAPAROSCOPIC APPENDECTOMY      FHx:    Reviewed / unchanged  SHx:    Reviewed / unchanged   Systems Review:  Constitutional: Denies fever, chills, wt changes, headaches, insomnia, fatigue, night sweats, change in appetite. Eyes: Denies redness, blurred  vision, diplopia, discharge, itchy, watery eyes.  ENT: Denies discharge, congestion, post nasal drip, epistaxis, sore throat, earache, hearing loss, dental pain, tinnitus, vertigo, sinus pain, snoring.  CV: Denies chest pain, palpitations, irregular heartbeat, syncope, dyspnea, diaphoresis, orthopnea, PND, claudication or edema. Respiratory: denies cough, dyspnea, DOE, pleurisy, hoarseness, laryngitis, wheezing.  Gastrointestinal: Denies dysphagia, odynophagia, heartburn, reflux, water brash, abdominal pain or cramps, nausea, vomiting, bloating, diarrhea, constipation, hematemesis, melena, hematochezia  or hemorrhoids. Genitourinary: Denies dysuria, frequency, urgency, nocturia, hesitancy, discharge, hematuria or flank pain. Musculoskeletal: Denies arthralgias,  myalgias, stiffness, jt. swelling, pain, limping or strain/sprain.  Skin: Denies pruritus, rash, hives, warts, acne, eczema or change in skin lesion(s). Neuro: No weakness, tremor, incoordination, spasms, paresthesia or pain. Psychiatric: Denies confusion, memory loss or sensory loss. Endo: Denies change in weight, skin or hair change.  Heme/Lymph: No excessive bleeding, bruising or enlarged lymph nodes.  Physical Exam  BP 118/72   Pulse 64   Temp (!) 97.5 F (36.4 C)   Resp 16   Ht 6\' 3"  (1.905 m)   Wt 232 lb (105.2 kg)   SpO2 93%   BMI 29.00 kg/m   Appears  well nourished, well groomed  and in no distress.  Eyes: PERRLA, EOMs, conjunctiva no swelling or erythema. Sinuses: No frontal/maxillary tenderness ENT/Mouth: EAC's clear, TM's nl w/o erythema, bulging. Nares clear w/o erythema, swelling, exudates. Oropharynx clear without erythema or exudates. Oral hygiene is good. Tongue normal, non obstructing. Hearing intact.  Neck: Supple. Thyroid not palpable. Car 2+/2+ without bruits, nodes or JVD. Chest: Respirations nl with BS clear & equal w/o rales, rhonchi, wheezing or stridor.  Cor: Heart sounds normal w/ regular rate and rhythm without sig. murmurs, gallops, clicks or rubs. Peripheral pulses normal and equal  without edema.  Abdomen: Soft & bowel sounds normal. Non-tender w/o guarding, rebound, hernias, masses or organomegaly.  Lymphatics: Unremarkable.  Musculoskeletal: Full ROM all peripheral extremities, joint stability, 5/5 strength and normal gait.  Skin: Warm, dry without exposed rashes, lesions or ecchymosis apparent.  Neuro: Cranial nerves intact, reflexes equal bilaterally. Sensory-motor testing grossly intact. Tendon reflexes grossly intact.  Pysch: Alert & oriented x 3.  Insight and judgement nl & appropriate. No ideations.  Assessment and Plan:  1. Essential hypertension  - Continue medication, monitor blood pressure at home.  - Continue DASH diet.  Reminder to  go to the ER if any CP,  SOB, nausea, dizziness, severe HA, changes vision/speech.  - CBC with Differential/Platelet - COMPLETE METABOLIC PANEL WITH GFR - Magnesium - TSH  2. Hyperlipidemia, mixed  - Continue diet/meds, exercise,& lifestyle modifications.  - Continue monitor periodic cholesterol/liver & renal functions   - Lipid panel - TSH  3. Abnormal glucose  - Continue diet, exercise  - Lifestyle modifications.  - Monitor appropriate labs  - Hemoglobin A1c - Insulin, random  4. Vitamin D deficiency   - Continue supplementation.  - VITAMIN D 25 Hydroxy  5. Medication management  - CBC with Differential/Platelet - COMPLETE METABOLIC PANEL WITH GFR - Magnesium - Lipid panel - TSH - Hemoglobin A1c - Insulin, random - VITAMIN D 25 Hydroxy        Discussed  regular exercise, BP monitoring, weight control to achieve/maintain BMI less than 25 and discussed med and SE's. Recommended labs to assess and monitor clinical status with further disposition pending results of labs.  I discussed the assessment and treatment plan with the patient. The patient was provided an opportunity to ask questions and all were answered.  The patient agreed with the plan and demonstrated an understanding of the instructions.  I provided over 30 minutes of exam, counseling, chart review and  complex critical decision making.        The patient was advised to call back or seek an in-person evaluation if the symptoms worsen or if the condition fails to improve as anticipated.   Kirtland Bouchard, MD

## 2020-12-01 NOTE — Patient Instructions (Signed)

## 2020-12-02 ENCOUNTER — Ambulatory Visit (INDEPENDENT_AMBULATORY_CARE_PROVIDER_SITE_OTHER): Payer: Medicare Other | Admitting: Internal Medicine

## 2020-12-02 ENCOUNTER — Encounter: Payer: Self-pay | Admitting: Internal Medicine

## 2020-12-02 ENCOUNTER — Other Ambulatory Visit: Payer: Self-pay

## 2020-12-02 VITALS — BP 118/72 | HR 64 | Temp 97.5°F | Resp 16 | Ht 75.0 in | Wt 232.0 lb

## 2020-12-02 DIAGNOSIS — R7309 Other abnormal glucose: Secondary | ICD-10-CM

## 2020-12-02 DIAGNOSIS — Z79899 Other long term (current) drug therapy: Secondary | ICD-10-CM

## 2020-12-02 DIAGNOSIS — E559 Vitamin D deficiency, unspecified: Secondary | ICD-10-CM

## 2020-12-02 DIAGNOSIS — E782 Mixed hyperlipidemia: Secondary | ICD-10-CM

## 2020-12-02 DIAGNOSIS — I10 Essential (primary) hypertension: Secondary | ICD-10-CM

## 2020-12-03 LAB — INSULIN, RANDOM: Insulin: 19.1 u[IU]/mL

## 2020-12-03 LAB — CBC WITH DIFFERENTIAL/PLATELET
Absolute Monocytes: 587 cells/uL (ref 200–950)
Basophils Absolute: 53 cells/uL (ref 0–200)
Basophils Relative: 0.8 %
Eosinophils Absolute: 92 cells/uL (ref 15–500)
Eosinophils Relative: 1.4 %
HCT: 45.5 % (ref 38.5–50.0)
Hemoglobin: 15.6 g/dL (ref 13.2–17.1)
Lymphs Abs: 2171 cells/uL (ref 850–3900)
MCH: 33.3 pg — ABNORMAL HIGH (ref 27.0–33.0)
MCHC: 34.3 g/dL (ref 32.0–36.0)
MCV: 97.2 fL (ref 80.0–100.0)
MPV: 11.1 fL (ref 7.5–12.5)
Monocytes Relative: 8.9 %
Neutro Abs: 3696 cells/uL (ref 1500–7800)
Neutrophils Relative %: 56 %
Platelets: 166 10*3/uL (ref 140–400)
RBC: 4.68 10*6/uL (ref 4.20–5.80)
RDW: 12.8 % (ref 11.0–15.0)
Total Lymphocyte: 32.9 %
WBC: 6.6 10*3/uL (ref 3.8–10.8)

## 2020-12-03 LAB — COMPLETE METABOLIC PANEL WITH GFR
AG Ratio: 1.8 (calc) (ref 1.0–2.5)
ALT: 19 U/L (ref 9–46)
AST: 19 U/L (ref 10–35)
Albumin: 4.6 g/dL (ref 3.6–5.1)
Alkaline phosphatase (APISO): 59 U/L (ref 35–144)
BUN: 19 mg/dL (ref 7–25)
CO2: 24 mmol/L (ref 20–32)
Calcium: 9.7 mg/dL (ref 8.6–10.3)
Chloride: 103 mmol/L (ref 98–110)
Creat: 1.09 mg/dL (ref 0.70–1.18)
GFR, Est African American: 75 mL/min/{1.73_m2} (ref >=60)
GFR, Est Non African American: 65 mL/min/{1.73_m2} (ref >=60)
Globulin: 2.5 g/dL (calc) (ref 1.9–3.7)
Glucose, Bld: 98 mg/dL (ref 65–99)
Potassium: 4 mmol/L (ref 3.5–5.3)
Sodium: 139 mmol/L (ref 135–146)
Total Bilirubin: 1.2 mg/dL (ref 0.2–1.2)
Total Protein: 7.1 g/dL (ref 6.1–8.1)

## 2020-12-03 LAB — TSH: TSH: 2.45 mIU/L (ref 0.40–4.50)

## 2020-12-03 LAB — LIPID PANEL
Cholesterol: 182 mg/dL (ref <200)
HDL: 48 mg/dL (ref >=40)
LDL Cholesterol (Calc): 102 mg/dL (calc) — ABNORMAL HIGH
Non-HDL Cholesterol (Calc): 134 mg/dL (calc) — ABNORMAL HIGH (ref <130)
Total CHOL/HDL Ratio: 3.8 (calc) (ref <5.0)
Triglycerides: 205 mg/dL — ABNORMAL HIGH (ref <150)

## 2020-12-03 LAB — VITAMIN D 25 HYDROXY (VIT D DEFICIENCY, FRACTURES): Vit D, 25-Hydroxy: 106 ng/mL — ABNORMAL HIGH (ref 30–100)

## 2020-12-03 LAB — HEMOGLOBIN A1C
Hgb A1c MFr Bld: 5.4 % of total Hgb (ref <5.7)
Mean Plasma Glucose: 108 mg/dL
eAG (mmol/L): 6 mmol/L

## 2020-12-03 LAB — MAGNESIUM: Magnesium: 2.2 mg/dL (ref 1.5–2.5)

## 2020-12-05 ENCOUNTER — Other Ambulatory Visit: Payer: Self-pay | Admitting: Internal Medicine

## 2020-12-05 DIAGNOSIS — E782 Mixed hyperlipidemia: Secondary | ICD-10-CM

## 2020-12-05 MED ORDER — ROSUVASTATIN CALCIUM 10 MG PO TABS: ORAL_TABLET | ORAL | 3 refills | Status: DC

## 2020-12-05 NOTE — Progress Notes (Signed)
============================================================ ============================================================  -   Total Chol = 182 - Excellent   - Very low risk for Heart Attack / Stroke ========================================================  - But LDL Chol = 102 is still a little high, So stop the Pravastatin &   will send in a new Rx for Crestor Low dose  ============================================================ ============================================================  - A1c - Normal - Great - No Diabetes ! ============================================================ ============================================================  - Vitamin D = 106 - Great - Please keep dose Same ============================================================ ============================================================  - All Else - CBC - Kidneys - Electrolytes - Liver - Magnesium & Thyroid   - all Normal / OK ============================================================ ============================================================  - Keep up the [place] Work !  ============================================================ ============================================================

## 2020-12-05 NOTE — Progress Notes (Signed)
============================================================ ============================================================  -    Total Chol = 182 - Excellent   - Very low risk for Heart Attack / Stroke ========================================================  - But LDL Chol = 102 is still a little high, So stop the Pravastatin &   will send in a new Rx for Crestor Low dose  ============================================================ ============================================================  - A1c - Normal - Great - No Diabetes ! ============================================================ ============================================================  - Vitamin D = 106 - Great - Please keep dose Same ============================================================ ============================================================  - All Else - CBC - Kidneys - Electrolytes - Liver - Magnesium & Thyroid   - all Normal / OK ============================================================ ============================================================  - Keep up the Great Work !  ============================================================ ============================================================

## 2021-01-20 ENCOUNTER — Other Ambulatory Visit: Payer: Self-pay | Admitting: Adult Health

## 2021-01-20 NOTE — Telephone Encounter (Signed)
Med was d/c'd at recent OV per patient preference

## 2021-01-27 DIAGNOSIS — H402222 Chronic angle-closure glaucoma, left eye, moderate stage: Secondary | ICD-10-CM | POA: Diagnosis not present

## 2021-01-27 DIAGNOSIS — H402211 Chronic angle-closure glaucoma, right eye, mild stage: Secondary | ICD-10-CM | POA: Diagnosis not present

## 2021-01-27 DIAGNOSIS — H524 Presbyopia: Secondary | ICD-10-CM | POA: Diagnosis not present

## 2021-01-27 DIAGNOSIS — H0102A Squamous blepharitis right eye, upper and lower eyelids: Secondary | ICD-10-CM | POA: Diagnosis not present

## 2021-02-02 NOTE — Progress Notes (Signed)
MEDICARE ANNUAL WELLNESS VISIT AND FOLLOW UP   Assessment:   Cody Hamilton was seen today for follow-up and medicare wellness.  Diagnoses and all orders for this visit:  Medicare annual wellness visit, subsequent Yearly  Essential hypertension Continue medication: HCTZ 12.5mg  & enalapril 20mg  Monitor blood pressure at home; call if consistently over 130/80 CBC, CMP Continue DASH diet.   Reminder to go to the ER if any CP, SOB, nausea, dizziness, severe HA, changes vision/speech, left arm numbness and tingling and jaw pain.  Hyperlipidemia, mixed Continue medications: Pravachol 40mg  and fish oil Continue low cholesterol diet and exerc Check lipid panel.   Abnormal glucose Discussed dietary and exercise modifications Will check A1c   Vitamin D deficiency Taking supplementation Will check labs  Gastroesophageal reflux disease without esophagitis Doing well on current regiment Monitor diet for triggers  Benign localized prostatic hyperplasia with lower urinary tract symptoms (LUTS) No change in nocturia x1 Continue tamsulosin0.4mg  nightly  Decreased hearing of both ears Discussed audiology referral Does not want hearing aids Discussed evaluation at [facility]  Obesity (BMI 28.0-28.9) Discussed dietary and exercise modifications  Medication management Continued  Glaucoma, unspecified glaucoma type, unspecified laterality Continue Xalatan 1drop both eyes Eye exam performed 01/2021    Over 30 minutes of face to face interview, exam, counseling, chart review, and critical decision making was performed  Future Appointments  Date Time Provider [place]  05/30/2021 11:00 AM Unk Pinto, MD [facility] None  02/06/2022  3:30 PM Magda Bernheim, NP [facility] None     Plan:   During the course of the visit the patient was educated and counseled about appropriate screening and preventive services including:   Pneumococcal vaccine  Influenza vaccine Prevnar  13 Td vaccine Screening electrocardiogram Colorectal cancer screening Diabetes screening Glaucoma screening Nutrition counseling    Subjective:  Cody Hamilton is a 80 y.o. male who presents for Medicare Annual Wellness Visit and 3 month follow up for HTN, HLD, BPH, Glaucoma, abnormal glucose, and vitamin D Def.   His blood pressure has not been checked at home, today their BP is BP: 136/80 He does not workout. He denies chest pain, shortness of breath, dizziness.  He is on cholesterol medication and denies myalgias. His cholesterol is at goal. The cholesterol last visit was:   Lab Results  Component Value Date   CHOL 182 12/02/2020   HDL 48 12/02/2020   LDLCALC 102 (H) 12/02/2020   TRIG 205 (H) 12/02/2020   CHOLHDL 3.8 12/02/2020   He has been working on diet and exercise by mean of house and yard work for abnormal glucose, and denies foot ulcerations, paresthesia of the feet, polydipsia, polyuria and visual disturbances. Last A1C in the office was:  Lab Results  Component Value Date   HGBA1C 5.4 12/02/2020   Last GFR Lab Results  Component Value Date   GFRNONAA 65 12/02/2020     Lab Results  Component Value Date   GFRAA 75 12/02/2020   Patient is on Vitamin D supplement.   Lab Results  Component Value Date   VD25OH 106 (H) 12/02/2020      BPH Currently he is symptomatic and denies or endorses nocturia x 1 a night and denies urinary hesitancy, urinary frequency, incomplete voiding, double voiding, weak stream, perineal discomfort, dysuria, hematuria, abdominal pain, flank pain and testicular pain.  He is taking Tamulosin (Flomax). He is not following with urology at this time and last results were in normal. Lab Results  Component Value Date  PSA 1.19 05/12/2020     Medication Review:   Current Outpatient Medications (Cardiovascular):    enalapril (VASOTEC) 20 MG tablet, Take  1 tablet  Daily  for BP   hydrochlorothiazide (HYDRODIURIL) 12.5 MG tablet,  TAKE 1 TABLET BY MOUTH EVERY MORNING FOR BLOOD PRESSURE AND FLUID RETENTION/ ANKLE SWELLING.   rosuvastatin (CRESTOR) 10 MG tablet, Take  1 tablet  Daily  for Cholesterol   Current Outpatient Medications (Analgesics):    aspirin 81 MG EC tablet, Take 81 mg by mouth daily.   Current Outpatient Medications (Other):    Cholecalciferol (VITAMIN D3) 10000 UNITS capsule, Take 10,000 Units by mouth daily.   latanoprost (XALATAN) 0.005 % ophthalmic solution, Place 1 drop into both eyes at bedtime.   magnesium oxide (MAG-OX) 400 MG tablet, Take 400 mg by mouth 2 (two) times daily.   Multiple Vitamin (MULTIVITAMIN) tablet, Take 1 tablet by mouth daily.   Omega-3 Fatty Acids (FISH OIL) 1200 MG CAPS, Take 1 capsule by mouth 2 (two) times daily.   tamsulosin (FLOMAX) 0.4 MG CAPS capsule, TAKE 1 CAPSULE BY MOUTH AT BEDTIME FOR PROSTATE   vitamin C (ASCORBIC ACID) 500 MG tablet, Take 500 mg by mouth daily.  Allergies: Allergies  Allergen Reactions   Viagra [Sildenafil Citrate]     Headache    Current Problems (verified) has Hyperlipidemia; Hypertension; Other abnormal glucose; Vitamin D deficiency; Benign prostatic hyperplasia; Glaucoma; DDD (degenerative disc disease), lumbar; Medication management; Overweight (BMI 25.0-29.9); Depression, major, in remission ([place]); Encounter for Medicare annual wellness exam; and Decreased hearing on their problem list.  Screening Tests Immunization History  Administered Date(s) Administered   Influenza, High Dose Seasonal PF 04/01/2019   Influenza-Unspecified 04/30/2014, 04/12/2020   Moderna Sars-Covid-2 Vaccination 10/13/2019, 11/11/2019   Pneumococcal Conjugate-13 06/08/2014   Pneumococcal Polysaccharide-23 08/09/2009   Td 08/09/2009    Preventative care: Last colonoscopy: 2019, no repeat due to age  Prior vaccinations: TD or Tdap: 2011  Influenza: 04/12/2020 Pneumococcal: 2011 Prevnar13: 2013 Shingles/Zostavax: Declined   Names of Other  Physician/Practitioners you currently use: 1.  [facility] here for primary care 2. Eye Exam  Dr Kathlen Mody 01/2021 3. Dental Exam Dr Scheryl Darter  10/22  Patient Care Team: Unk Pinto, MD as PCP - General (Internal Medicine) Josue Hector, MD as Consulting Physician (Cardiology) Laurence Spates, MD (Inactive) as Consulting Physician (Gastroenterology)  Surgical: He  has a past surgical history that includes Inguinal hernia repair; Laparoscopic appendectomy; and Cardiac catheterization (1999). Family His family history includes Alzheimer's disease in his mother; Cancer in his father; Hyperlipidemia in his father and sister; Hypertension in his father, mother, and sister; Lymphoma in his father. Social history  He reports that he quit smoking about 19 years ago. He has never used smokeless tobacco. He reports that he does not drink alcohol and does not use drugs.  MEDICARE WELLNESS OBJECTIVES: Physical activity: Current Exercise Habits: Home exercise routine, Time (Minutes): 60, Frequency (Times/Week): 1, Weekly Exercise (Minutes/Week): 60, Intensity: Mild, Exercise limited by: None identified Cardiac risk factors: Cardiac Risk Factors include: advanced age (>34men, >37 women);dyslipidemia;hypertension;male gender;smoking/ tobacco exposure Depression/mood screen:   Depression screen [facility] 2/9 02/03/2021  Decreased Interest 0  Down, Depressed, Hopeless 0  PHQ - 2 Score 0    ADLs:  In your present state of health, do you have any difficulty performing the following activities: 02/03/2021 05/11/2020  Hearing? Y N  Comment But does not want hearing aids -  Vision? N N  Difficulty concentrating  or making decisions? N N  Walking or climbing stairs? N N  Dressing or bathing? N N  Doing errands, shopping? N N  Some recent data might be hidden     Cognitive Testing  Alert? Yes  Normal Appearance?Yes  Oriented to person? Yes  Place? Yes   Time? Yes  Recall of  three objects?  Yes  Can perform simple calculations? Yes  Displays appropriate judgment?Yes  Can read the correct time from a watch face?Yes  EOL planning: Does Patient Have a Medical Advance Directive?: Yes Type of Advance Directive: Healthcare Power of Attorney, Living will Copy of [place] in Chart?: No - copy requested   Objective:   Today's Vitals   02/03/21 1529  BP: 136/80  Pulse: 70  Temp: (!) 96.8 F (36 C)  SpO2: 97%  Weight: 237 lb (107.5 kg)    Body mass index is 29.62 kg/m.  General appearance: alert, no distress, WD/WN, male HEENT: normocephalic, sclerae anicteric, TMs pearly, nares patent, no discharge or erythema, pharynx normal Oral cavity: MMM, no lesions Neck: supple, no lymphadenopathy, no thyromegaly, no masses Heart: RRR, normal S1, S2, no murmurs Lungs: CTA bilaterally, no wheezes, rhonchi, or rales Abdomen: +bs, soft, non tender, non distended, no masses, no hepatomegaly, no splenomegaly Musculoskeletal: nontender, no swelling, no obvious deformity Extremities: no edema, no cyanosis, no clubbing Pulses: 2+ symmetric, upper and lower extremities, normal cap refill Neurological: alert, oriented x 3, CN2-12 intact, strength normal upper extremities and lower extremities, sensation normal throughout checked with monofilament, DTRs 2+ throughout, no cerebellar signs, gait normal,  Psychiatric: normal affect, behavior normal, pleasant     Medicare Attestation I have personally reviewed: The patient's medical and social history Their use of alcohol, tobacco or illicit drugs Their current medications and supplements The patient's functional ability including ADLs,fall risks, home safety risks, cognitive, and hearing and visual impairment Diet and physical activities Evidence for depression or mood disorders  The patient's weight, height, BMI, and visual acuity have been recorded in the chart.  I have made referrals, counseling, and  provided education to the patient based on review of the above and I have provided the patient with a written personalized care plan for preventive services.    Magda Bernheim ANP-C  [facility]  02/03/2021

## 2021-02-03 ENCOUNTER — Encounter: Payer: Self-pay | Admitting: Nurse Practitioner

## 2021-02-03 ENCOUNTER — Other Ambulatory Visit: Payer: Self-pay

## 2021-02-03 ENCOUNTER — Ambulatory Visit (INDEPENDENT_AMBULATORY_CARE_PROVIDER_SITE_OTHER): Payer: Medicare Other | Admitting: Nurse Practitioner

## 2021-02-03 VITALS — BP 136/80 | HR 70 | Temp 96.8°F | Wt 237.0 lb

## 2021-02-03 DIAGNOSIS — E559 Vitamin D deficiency, unspecified: Secondary | ICD-10-CM | POA: Diagnosis not present

## 2021-02-03 DIAGNOSIS — E782 Mixed hyperlipidemia: Secondary | ICD-10-CM

## 2021-02-03 DIAGNOSIS — Z0001 Encounter for general adult medical examination with abnormal findings: Secondary | ICD-10-CM | POA: Diagnosis not present

## 2021-02-03 DIAGNOSIS — N401 Enlarged prostate with lower urinary tract symptoms: Secondary | ICD-10-CM

## 2021-02-03 DIAGNOSIS — Z79899 Other long term (current) drug therapy: Secondary | ICD-10-CM | POA: Diagnosis not present

## 2021-02-03 DIAGNOSIS — Z0000 Encounter for general adult medical examination without abnormal findings: Secondary | ICD-10-CM

## 2021-02-03 DIAGNOSIS — Z1329 Encounter for screening for other suspected endocrine disorder: Secondary | ICD-10-CM | POA: Diagnosis not present

## 2021-02-03 DIAGNOSIS — H9193 Unspecified hearing loss, bilateral: Secondary | ICD-10-CM

## 2021-02-03 DIAGNOSIS — H409 Unspecified glaucoma: Secondary | ICD-10-CM

## 2021-02-03 DIAGNOSIS — R7309 Other abnormal glucose: Secondary | ICD-10-CM | POA: Diagnosis not present

## 2021-02-03 DIAGNOSIS — R6889 Other general symptoms and signs: Secondary | ICD-10-CM

## 2021-02-03 DIAGNOSIS — I10 Essential (primary) hypertension: Secondary | ICD-10-CM | POA: Diagnosis not present

## 2021-02-03 NOTE — Patient Instructions (Signed)
GENERAL HEALTH GOALS   Know what a healthy weight is for you (roughly BMI <25) and aim to maintain this   Aim for 7+ servings of fruits and vegetables daily   70-80+ fluid ounces of water or unsweet tea for healthy kidneys   Limit to max 1 drink of alcohol per day; avoid smoking/tobacco   Limit animal fats in diet for cholesterol and heart health - choose grass fed whenever available   Avoid highly processed foods, and foods high in saturated/trans fats   Aim for low stress - take time to unwind and care for your mental health   Aim for 150 min of moderate intensity exercise weekly for heart health, and weights twice weekly for bone health   Aim for 7-9 hours of sleep daily   Mr. Vosler , Thank you for taking time to come for your Medicare Wellness Visit. I appreciate your ongoing commitment to your health goals. Please review the following plan we discussed and let me know if I can assist you in the future.   These are the goals we discussed:  Goals      Exercise 3x per week (30 min per time)     Weight (lb) < 220 lb (99.8 kg)     Try keeping a food log Focus on reasonable portions -          This is a list of the screening recommended for you and due dates:  Health Maintenance  Topic Date Due   Hepatitis C Screening: USPSTF Recommendation to screen - Ages 53-79 yo.  Never done   Zoster (Shingles) Vaccine (1 of 2) Never done   Tetanus Vaccine  08/10/2019   COVID-19 Vaccine (3 - Moderna risk series) 12/09/2019   Flu Shot  02/28/2021   Pneumonia vaccines  Completed   HPV Vaccine  Aged Out

## 2021-02-04 LAB — MAGNESIUM: Magnesium: 2.4 mg/dL (ref 1.5–2.5)

## 2021-02-04 LAB — MICROALBUMIN / CREATININE URINE RATIO
Creatinine, Urine: 133 mg/dL (ref 20–320)
Microalb Creat Ratio: 2 mcg/mg creat (ref <30)
Microalb, Ur: 0.3 mg/dL

## 2021-02-04 LAB — CBC WITH DIFFERENTIAL/PLATELET
Absolute Monocytes: 587 cells/uL (ref 200–950)
Basophils Absolute: 53 cells/uL (ref 0–200)
Basophils Relative: 0.8 %
Eosinophils Absolute: 172 cells/uL (ref 15–500)
Eosinophils Relative: 2.6 %
HCT: 45.4 % (ref 38.5–50.0)
Hemoglobin: 15.4 g/dL (ref 13.2–17.1)
Lymphs Abs: 2165 cells/uL (ref 850–3900)
MCH: 33.2 pg — ABNORMAL HIGH (ref 27.0–33.0)
MCHC: 33.9 g/dL (ref 32.0–36.0)
MCV: 97.8 fL (ref 80.0–100.0)
MPV: 10.7 fL (ref 7.5–12.5)
Monocytes Relative: 8.9 %
Neutro Abs: 3623 cells/uL (ref 1500–7800)
Neutrophils Relative %: 54.9 %
Platelets: 153 10*3/uL (ref 140–400)
RBC: 4.64 10*6/uL (ref 4.20–5.80)
RDW: 12.4 % (ref 11.0–15.0)
Total Lymphocyte: 32.8 %
WBC: 6.6 10*3/uL (ref 3.8–10.8)

## 2021-02-04 LAB — COMPLETE METABOLIC PANEL WITH GFR
AG Ratio: 1.9 (calc) (ref 1.0–2.5)
ALT: 26 U/L (ref 9–46)
AST: 23 U/L (ref 10–35)
Albumin: 4.8 g/dL (ref 3.6–5.1)
Alkaline phosphatase (APISO): 66 U/L (ref 35–144)
BUN: 20 mg/dL (ref 7–25)
CO2: 29 mmol/L (ref 20–32)
Calcium: 10.1 mg/dL (ref 8.6–10.3)
Chloride: 102 mmol/L (ref 98–110)
Creat: 1.15 mg/dL (ref 0.70–1.18)
GFR, Est African American: 70 mL/min/{1.73_m2} (ref >=60)
GFR, Est Non African American: 60 mL/min/{1.73_m2} (ref >=60)
Globulin: 2.5 g/dL (calc) (ref 1.9–3.7)
Glucose, Bld: 86 mg/dL (ref 65–99)
Potassium: 4.8 mmol/L (ref 3.5–5.3)
Sodium: 141 mmol/L (ref 135–146)
Total Bilirubin: 1 mg/dL (ref 0.2–1.2)
Total Protein: 7.3 g/dL (ref 6.1–8.1)

## 2021-02-04 LAB — URINALYSIS, ROUTINE W REFLEX MICROSCOPIC
Bilirubin Urine: NEGATIVE
Glucose, UA: NEGATIVE
Hgb urine dipstick: NEGATIVE
Ketones, ur: NEGATIVE
Leukocytes,Ua: NEGATIVE
Nitrite: NEGATIVE
Protein, ur: NEGATIVE
Specific Gravity, Urine: 1.018 (ref 1.001–1.035)
pH: 6 (ref 5.0–8.0)

## 2021-02-04 LAB — HEMOGLOBIN A1C
Hgb A1c MFr Bld: 5.4 % of total Hgb (ref <5.7)
Mean Plasma Glucose: 108 mg/dL
eAG (mmol/L): 6 mmol/L

## 2021-02-04 LAB — LIPID PANEL
Cholesterol: 149 mg/dL (ref <200)
HDL: 54 mg/dL (ref >=40)
LDL Cholesterol (Calc): 70 mg/dL (calc)
Non-HDL Cholesterol (Calc): 95 mg/dL (calc) (ref <130)
Total CHOL/HDL Ratio: 2.8 (calc) (ref <5.0)
Triglycerides: 169 mg/dL — ABNORMAL HIGH (ref <150)

## 2021-02-04 LAB — VITAMIN D 25 HYDROXY (VIT D DEFICIENCY, FRACTURES): Vit D, 25-Hydroxy: 97 ng/mL (ref 30–100)

## 2021-02-04 LAB — TSH: TSH: 2.99 mIU/L (ref 0.40–4.50)

## 2021-02-04 NOTE — Progress Notes (Signed)
Patient is aware of lab results and instructions. -e welch

## 2021-02-14 ENCOUNTER — Encounter: Payer: Self-pay | Admitting: Nurse Practitioner

## 2021-03-25 ENCOUNTER — Other Ambulatory Visit: Payer: Self-pay | Admitting: Adult Health

## 2021-03-25 DIAGNOSIS — N401 Enlarged prostate with lower urinary tract symptoms: Secondary | ICD-10-CM

## 2021-04-27 ENCOUNTER — Other Ambulatory Visit: Payer: Self-pay | Admitting: Adult Health Nurse Practitioner

## 2021-04-27 DIAGNOSIS — I10 Essential (primary) hypertension: Secondary | ICD-10-CM

## 2021-05-29 ENCOUNTER — Encounter: Payer: Self-pay | Admitting: Internal Medicine

## 2021-05-29 NOTE — Patient Instructions (Signed)

## 2021-05-29 NOTE — Progress Notes (Signed)
Annual  Screening/Preventative Visit  & Comprehensive Evaluation & Examination  Future Appointments  Date Time Provider [place]  05/30/2021    CPE 11:00 AM Unk Pinto, MD [facility] None  02/06/2022     Wellness  3:30 PM Magda Bernheim, NP [facility] None  05/30/2022    CPE 11:00 AM Unk Pinto, MD [facility] None            This very nice 80 y.o. MWM presents for a Screening /Preventative Visit & comprehensive evaluation and management of multiple medical co-morbidities.  Patient has been followed for HTN, HLD, Prediabetes and Vitamin D Deficiency.       HTN predates circa 1990. Patient's BP has been controlled at home.  Today's BP is at goal - 138/70. Patient denies any cardiac symptoms as chest pain, palpitations, shortness of breath, dizziness or ankle swelling.       Patient's hyperlipidemia is controlled with diet and Pravastatin. Patient denies myalgias or other medication SE's. Last lipids were at goal with sl elevated Trig's :  Lab Results  Component Value Date   CHOL 149 02/03/2021   HDL 54 02/03/2021   LDLCALC 70 02/03/2021   TRIG 169 (H) 02/03/2021   CHOLHDL 2.8 02/03/2021         Patient has hx/o prediabetes (A1c 5.7%  /2011) and patient denies reactive hypoglycemic symptoms, visual blurring, diabetic polys or paresthesias. Last A1c was at goal:   Lab Results  Component Value Date   HGBA1C 5.4 02/03/2021          Finally, patient has history of Vitamin D Deficiency ("36" /2008) and last vitamin D was at goal:   Lab Results  Component Value Date   VD25OH 97 02/03/2021     Current Outpatient Medications on File Prior to Visit  Medication Sig   aspirin 81 MG EC tablet Take daily.   VITAMIN D 45364 UNITS capsule Take daily.   enalapril (VASOTEC) 20 MG tablet Take  1 tablet  Daily     hydrochlorothiazide  12.5 MG tablet TAKE 1 TABLET EVERY MORNING    XALATAN 0.005 % ophth soln Place 1 drop into both eyes at bedtime.   magnesium 400 MG   Take  2  times daily.   Multiple Vitamin  Take 1 tablet , daily.   Omega-3 FISH OIL 1200 MG CAPS Take 1 capsule  2 times daily.   rosuvastatin  10 MG tablet Take  1 tablet  Daily     tamsulosin 0.4 MG  TAKE 1 CAPSULE AT BEDTIME FOR PROSTATE   vitamin C 500 MG tablet Take  daily.    Allergies  Allergen Reactions   Viagra [Sildenafil Citrate]     Headache    Past Medical History:  Diagnosis Date   BPH (benign prostatic hypertrophy)    Colon polyps    Elevated hemoglobin A1c    Glaucoma    Hypertension    Vitamin D deficiency      Health Maintenance  Topic Date Due   Zoster Vaccines- Shingrix (1 of 2) Never done   COVID-19 Vaccine (3 - Moderna) 12/09/2019   INFLUENZA VACCINE  02/28/2021   TETANUS/TDAP  02/14/2022 (Originally 08/10/2019)   Hepatitis C Screening  02/14/2022 (Originally 01/06/1959)   Pneumonia Vaccine  Completed   HPV VACCINES  Aged Out    Immunization History  Administered Date(s) Administered   Influenza, High Dose  04/01/2019   Influenza 04/30/2014, 04/12/2020   Moderna Sars-Covid-2 Vacci 10/13/2019, 11/11/2019  Pneumococcal -13 06/08/2014   Pneumococcal -23 08/09/2009   Td 08/09/2009    Last Colon - 09/07/2017 - Dr Brynda Rim - No F/U due to age   Past Surgical History:  Procedure Laterality Date   CARDIAC CATHETERIZATION  [name]       Family History  Problem Relation Age of Onset   Hypertension Mother    Alzheimer's disease Mother    Hyperlipidemia Father    Hypertension Father    Cancer Father        esophagus   Lymphoma Father    Hypertension Sister    Hyperlipidemia Sister      Social History   Tobacco Use   Smoking status: Former    Types: Cigarettes    Quit date: 07/31/2001    Years since quitting: 19.8   Smokeless tobacco: Never  Substance Use Topics   Alcohol use: No   Drug use: No      ROS Constitutional: Denies fever, chills, weight loss/gain, headaches,  insomnia,  night sweats or change in appetite. Does c/o fatigue. Eyes: Denies redness, blurred vision, diplopia, discharge, itchy or watery eyes.  ENT: Denies discharge, congestion, post nasal drip, epistaxis, sore throat, earache, hearing loss, dental pain, Tinnitus, Vertigo, Sinus pain or snoring.  Cardio: Denies chest pain, palpitations, irregular heartbeat, syncope, dyspnea, diaphoresis, orthopnea, PND, claudication or edema Respiratory: denies cough, dyspnea, DOE, pleurisy, hoarseness, laryngitis or wheezing.  Gastrointestinal: Denies dysphagia, heartburn, reflux, water brash, pain, cramps, nausea, vomiting, bloating, diarrhea, constipation, hematemesis, melena, hematochezia, jaundice or hemorrhoids Genitourinary: Denies dysuria, frequency, discharge, hematuria or flank pain. Has urgency, nocturia x 2-3 & occasional hesitancy. Musculoskeletal: Denies arthralgia, myalgia, stiffness, Jt. Swelling, pain, limp or strain/sprain. Denies Falls. Skin: Denies puritis, rash, hives, warts, acne, eczema or change in skin lesion Neuro: No weakness, tremor, incoordination, spasms, paresthesia or pain Psychiatric: Denies confusion, memory loss or sensory loss. Denies Depression. Endocrine: Denies change in weight, skin, hair change, nocturia, and paresthesia, diabetic polys, visual blurring or hyper / hypo glycemic episodes.  Heme/Lymph: No excessive bleeding, bruising or enlarged lymph nodes.   Physical Exam  BP 138/70   Pulse 65   Temp 97.8 F (36.6 C)   Resp 16   Ht 6\' 3"  (1.905 m)   Wt 233 lb 6.4 oz (105.9 kg)   SpO2 96%   BMI 29.17 kg/m   General Appearance: Well nourished and well groomed and in no apparent distress.  Eyes: PERRLA, EOMs, conjunctiva no swelling or erythema, normal fundi and vessels. Sinuses: No frontal/maxillary tenderness ENT/Mouth: EACs patent / TMs  nl. Nares clear without erythema, swelling, mucoid exudates. Oral hygiene is good. No erythema, swelling, or exudate.  Tongue normal, non-obstructing. Tonsils not swollen or erythematous. Hearing normal.  Neck: Supple, thyroid not palpable. No bruits, nodes or JVD. Respiratory: Respiratory effort normal.  BS equal and clear bilateral without rales, rhonci, wheezing or stridor. Cardio: Heart sounds are normal with regular rate and rhythm and no murmurs, rubs or gallops. Peripheral pulses are normal and equal bilaterally without edema. No aortic or femoral bruits. Chest: symmetric with normal excursions and percussion.  Abdomen: Soft, with Nl bowel sounds. Nontender, no guarding, rebound, hernias, masses, or organomegaly.  Lymphatics: Non tender without lymphadenopathy.  Musculoskeletal: Full ROM all peripheral extremities, joint stability, 5/5 strength, and normal gait. Skin: Warm and dry without rashes, lesions, cyanosis, clubbing or  ecchymosis.  Neuro: Cranial nerves intact, reflexes equal bilaterally. Normal muscle  tone, no cerebellar symptoms. Sensation intact.  Pysch: Alert and oriented X 3 with normal affect, insight and judgment appropriate.   Assessment and Plan  1. Annual Preventative/Screening Exam    2. Essential hypertension  - EKG 12-Lead - [place], RETROPERITNL ABD,  LTD - Microalbumin / creatinine urine ratio - CBC with Differential/Platelet - COMPLETE METABOLIC PANEL WITH GFR - Magnesium - TSH  3. Hyperlipidemia, mixed  - EKG 12-Lead - [place], RETROPERITNL ABD,  LTD - Lipid panel  4. Abnormal glucose  - EKG 12-Lead - [place], RETROPERITNL ABD,  LTD - Hemoglobin A1c - Insulin, random  5. Vitamin D deficiency  - VITAMIN D 25 Hydroxy   6. BPH with obstruction/lower urinary tract symptoms  - PSA  7. Screening for colorectal cancer  - POC Hemoccult Bld/Stl   8. Prostate cancer screening  - PSA  9. Screening for ischemic heart disease  - EKG 12-Lead  10. FH: hypertension  - EKG 12-Lead - [place], RETROPERITNL ABD,  LTD  11. Former smoker  - EKG 12-Lead - [place], RETROPERITNL  ABD,  LTD  12. Screening for AAA (aortic abdominal aneurysm)  - [place], RETROPERITNL ABD,  LTD  13. Medication management  - Urinalysis, Routine w reflex microscopic - Microalbumin / creatinine urine ratio - CBC with Differential/Platelet - COMPLETE METABOLIC PANEL WITH GFR - Magnesium - Lipid panel - TSH - Hemoglobin A1c - Insulin, random - VITAMIN D 25 Hydroxy           Patient was counseled in prudent diet, weight control to achieve/maintain BMI less than 25, BP monitoring, regular exercise and medications as discussed.  Discussed med effects and SE's. Routine screening labs and tests as requested with regular follow-up as recommended. Over 40 minutes of exam, counseling, chart review and high complex critical decision making was performed   Kirtland Bouchard, MD

## 2021-05-30 ENCOUNTER — Ambulatory Visit (INDEPENDENT_AMBULATORY_CARE_PROVIDER_SITE_OTHER): Payer: Medicare Other | Admitting: Internal Medicine

## 2021-05-30 ENCOUNTER — Other Ambulatory Visit: Payer: Self-pay

## 2021-05-30 VITALS — BP 138/70 | HR 65 | Temp 97.8°F | Resp 16 | Ht 75.0 in | Wt 233.4 lb

## 2021-05-30 DIAGNOSIS — Z136 Encounter for screening for cardiovascular disorders: Secondary | ICD-10-CM

## 2021-05-30 DIAGNOSIS — E782 Mixed hyperlipidemia: Secondary | ICD-10-CM | POA: Diagnosis not present

## 2021-05-30 DIAGNOSIS — Z8249 Family history of ischemic heart disease and other diseases of the circulatory system: Secondary | ICD-10-CM | POA: Diagnosis not present

## 2021-05-30 DIAGNOSIS — Z79899 Other long term (current) drug therapy: Secondary | ICD-10-CM

## 2021-05-30 DIAGNOSIS — Z0000 Encounter for general adult medical examination without abnormal findings: Secondary | ICD-10-CM

## 2021-05-30 DIAGNOSIS — N138 Other obstructive and reflux uropathy: Secondary | ICD-10-CM

## 2021-05-30 DIAGNOSIS — R7309 Other abnormal glucose: Secondary | ICD-10-CM | POA: Diagnosis not present

## 2021-05-30 DIAGNOSIS — Z1211 Encounter for screening for malignant neoplasm of colon: Secondary | ICD-10-CM

## 2021-05-30 DIAGNOSIS — E559 Vitamin D deficiency, unspecified: Secondary | ICD-10-CM

## 2021-05-30 DIAGNOSIS — Z125 Encounter for screening for malignant neoplasm of prostate: Secondary | ICD-10-CM

## 2021-05-30 DIAGNOSIS — Z87891 Personal history of nicotine dependence: Secondary | ICD-10-CM

## 2021-05-30 DIAGNOSIS — I10 Essential (primary) hypertension: Secondary | ICD-10-CM | POA: Diagnosis not present

## 2021-05-30 DIAGNOSIS — Z0001 Encounter for general adult medical examination with abnormal findings: Secondary | ICD-10-CM

## 2021-05-31 LAB — LIPID PANEL
Cholesterol: 134 mg/dL (ref <200)
HDL: 50 mg/dL (ref >=40)
LDL Cholesterol (Calc): 63 mg/dL (calc)
Non-HDL Cholesterol (Calc): 84 mg/dL (calc) (ref <130)
Total CHOL/HDL Ratio: 2.7 (calc) (ref <5.0)
Triglycerides: 130 mg/dL (ref <150)

## 2021-05-31 LAB — CBC WITH DIFFERENTIAL/PLATELET
Absolute Monocytes: 470 cells/uL (ref 200–950)
Basophils Absolute: 22 cells/uL (ref 0–200)
Basophils Relative: 0.4 %
Eosinophils Absolute: 62 cells/uL (ref 15–500)
Eosinophils Relative: 1.1 %
HCT: 44.7 % (ref 38.5–50.0)
Hemoglobin: 15.3 g/dL (ref 13.2–17.1)
Lymphs Abs: 1506 cells/uL (ref 850–3900)
MCH: 33.4 pg — ABNORMAL HIGH (ref 27.0–33.0)
MCHC: 34.2 g/dL (ref 32.0–36.0)
MCV: 97.6 fL (ref 80.0–100.0)
MPV: 10.5 fL (ref 7.5–12.5)
Monocytes Relative: 8.4 %
Neutro Abs: 3539 cells/uL (ref 1500–7800)
Neutrophils Relative %: 63.2 %
Platelets: 152 10*3/uL (ref 140–400)
RBC: 4.58 10*6/uL (ref 4.20–5.80)
RDW: 12.4 % (ref 11.0–15.0)
Total Lymphocyte: 26.9 %
WBC: 5.6 10*3/uL (ref 3.8–10.8)

## 2021-05-31 LAB — URINALYSIS, ROUTINE W REFLEX MICROSCOPIC
Bilirubin Urine: NEGATIVE
Glucose, UA: NEGATIVE
Hgb urine dipstick: NEGATIVE
Ketones, ur: NEGATIVE
Leukocytes,Ua: NEGATIVE
Nitrite: NEGATIVE
Protein, ur: NEGATIVE
Specific Gravity, Urine: 1.018 (ref 1.001–1.035)
pH: 5.5 (ref 5.0–8.0)

## 2021-05-31 LAB — HEMOGLOBIN A1C
Hgb A1c MFr Bld: 5.3 % of total Hgb (ref <5.7)
Mean Plasma Glucose: 105 mg/dL
eAG (mmol/L): 5.8 mmol/L

## 2021-05-31 LAB — COMPLETE METABOLIC PANEL WITH GFR
AG Ratio: 1.8 (calc) (ref 1.0–2.5)
ALT: 24 U/L (ref 9–46)
AST: 20 U/L (ref 10–35)
Albumin: 4.4 g/dL (ref 3.6–5.1)
Alkaline phosphatase (APISO): 60 U/L (ref 35–144)
BUN: 16 mg/dL (ref 7–25)
CO2: 29 mmol/L (ref 20–32)
Calcium: 9.4 mg/dL (ref 8.6–10.3)
Chloride: 104 mmol/L (ref 98–110)
Creat: 1.07 mg/dL (ref 0.70–1.28)
Globulin: 2.4 g/dL (calc) (ref 1.9–3.7)
Glucose, Bld: 86 mg/dL (ref 65–99)
Potassium: 4.6 mmol/L (ref 3.5–5.3)
Sodium: 141 mmol/L (ref 135–146)
Total Bilirubin: 1 mg/dL (ref 0.2–1.2)
Total Protein: 6.8 g/dL (ref 6.1–8.1)
eGFR: 71 mL/min/{1.73_m2} (ref >=60)

## 2021-05-31 LAB — MICROALBUMIN / CREATININE URINE RATIO
Creatinine, Urine: 130 mg/dL (ref 20–320)
Microalb Creat Ratio: 6 mcg/mg creat (ref <30)
Microalb, Ur: 0.8 mg/dL

## 2021-05-31 LAB — TSH: TSH: 2.17 mIU/L (ref 0.40–4.50)

## 2021-05-31 LAB — PSA: PSA: 0.48 ng/mL (ref <=4.00)

## 2021-05-31 LAB — MAGNESIUM: Magnesium: 2.2 mg/dL (ref 1.5–2.5)

## 2021-05-31 LAB — INSULIN, RANDOM: Insulin: 15 u[IU]/mL

## 2021-05-31 LAB — VITAMIN D 25 HYDROXY (VIT D DEFICIENCY, FRACTURES): Vit D, 25-Hydroxy: 84 ng/mL (ref 30–100)

## 2021-05-31 NOTE — Progress Notes (Signed)
============================================================ -   Test results slightly outside the reference range are not unusual. If there is anything important, I will review this with you,  otherwise it is considered normal test values.  If you have further questions,  please do not hesitate to contact me at the office or via My Chart.  ============================================================ ============================================================  -  PSA - very Low  -  Great ! ============================================================ ============================================================  -  Total Chol = 134    &  LDL Chol = 63   - Both    Excellent   - Very low risk for Heart Attack  / Stroke ============================================================ ============================================================  -  A1c - Normal  - Great   - No Diabetes ! ============================================================ ============================================================  -  Vitamin D = 84 - Excellent - Pleas keep dose same  ============================================================ ============================================================  -  All Else - CBC - Kidneys - Electrolytes - Liver - Magnesium & Thyroid    - all  Normal / OK ============================================================ ============================================================  -  Keep up the [place] Work   ============================================================ ============================================================

## 2021-07-29 DIAGNOSIS — H402211 Chronic angle-closure glaucoma, right eye, mild stage: Secondary | ICD-10-CM | POA: Diagnosis not present

## 2021-07-29 DIAGNOSIS — H402222 Chronic angle-closure glaucoma, left eye, moderate stage: Secondary | ICD-10-CM | POA: Diagnosis not present

## 2021-07-29 DIAGNOSIS — H2513 Age-related nuclear cataract, bilateral: Secondary | ICD-10-CM | POA: Diagnosis not present

## 2021-07-29 DIAGNOSIS — H25013 Cortical age-related cataract, bilateral: Secondary | ICD-10-CM | POA: Diagnosis not present

## 2021-10-10 ENCOUNTER — Other Ambulatory Visit: Payer: Self-pay | Admitting: Internal Medicine

## 2021-10-10 DIAGNOSIS — E782 Mixed hyperlipidemia: Secondary | ICD-10-CM

## 2021-10-10 MED ORDER — ROSUVASTATIN CALCIUM 10 MG PO TABS: ORAL_TABLET | ORAL | 3 refills | Status: DC

## 2022-01-06 ENCOUNTER — Other Ambulatory Visit: Payer: Self-pay | Admitting: Internal Medicine

## 2022-01-27 DIAGNOSIS — H04123 Dry eye syndrome of bilateral lacrimal glands: Secondary | ICD-10-CM | POA: Diagnosis not present

## 2022-01-27 DIAGNOSIS — H524 Presbyopia: Secondary | ICD-10-CM | POA: Diagnosis not present

## 2022-01-27 DIAGNOSIS — H25813 Combined forms of age-related cataract, bilateral: Secondary | ICD-10-CM | POA: Diagnosis not present

## 2022-01-27 DIAGNOSIS — H402211 Chronic angle-closure glaucoma, right eye, mild stage: Secondary | ICD-10-CM | POA: Diagnosis not present

## 2022-01-27 DIAGNOSIS — H402222 Chronic angle-closure glaucoma, left eye, moderate stage: Secondary | ICD-10-CM | POA: Diagnosis not present

## 2022-01-27 LAB — HM DIABETES EYE EXAM

## 2022-02-02 NOTE — Progress Notes (Signed)
MEDICARE ANNUAL WELLNESS VISIT AND FOLLOW UP   Assessment:   Cody Hamilton was seen today for follow-up and medicare wellness.  Diagnoses and all orders for this visit:  Medicare annual wellness visit, subsequent Yearly  Essential hypertension Continue medication: HCTZ 12.19m & enalapril 270mMonitor blood pressure at home; call if consistently over 130/80 CBC, CMP Continue DASH diet.   Reminder to go to the ER if any CP, SOB, nausea, dizziness, severe HA, changes vision/speech, left arm numbness and tingling and jaw pain.  Hyperlipidemia, mixed Continue medications: Rosuvastatin 10 mg and fish oil Continue low cholesterol diet and exerc Check lipid panel.  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 (LUTS) No change in nocturia x1 Continue [id number]  Decreased hearing of both ears Discussed audiology referral Does not want hearing aids Discussed evaluation at [facility]  Obesity Long discussion about weight loss, diet, and exercise Recommended diet heavy in fruits and veggies and low in animal meats, cheeses, and dairy products, appropriate calorie intake Patient will work on increasing activity and limiting saturated fats and simple carbs Follow up at next visit Discussed dietary and exercise modifications  Medication management Continued - TSH  Glaucoma, unspecified glaucoma type, unspecified laterality Continue Xalatan 1drop both eyes Eye exam performed 01/2022    Over 30 minutes of face to face interview, exam, counseling, chart review, and critical decision making was performed  Future Appointments  Date Time Provider [place]0/31/2023 11:00 AM  Cody Hamilton [facility] None  02/07/2023  4:00 PM WilAlycia RossettiP [facility] None     Plan:   During the course of the visit the patient was educated and counseled about appropriate screening and preventive services including:   Pneumococcal vaccine  Influenza vaccine Prevnar 13 Td vaccine Screening electrocardiogram Colorectal cancer screening Diabetes screening Glaucoma screening Nutrition counseling    Subjective:  Cody Hamilton a [age]o. male who presents for Medicare Annual Wellness Visit and 3 month follow up for HTN, HLD, BPH, Glaucoma, abnormal glucose, and vitamin D Def.   His blood pressure has been checked at home, today their BP is BP: 140/82  BP Readings from Last 3 Encounters:  02/06/22 140/82  05/30/21 138/70  02/03/21 136/80  He does not workout. He denies chest pain, shortness of breath, dizziness.   He is on cholesterol medication, Rosuvastatin 10 mg daily and denies myalgias. His cholesterol is at goal. The cholesterol last visit was:   Lab Results  Component Value Date   CHOL 134 05/30/2021   HDL 50 05/30/2021   LDLCALC 63 05/30/2021   TRIG 130 05/30/2021   CHOLHDL 2.7 05/30/2021   He has been working on diet and exercise by mean of house and yard work for abnormal glucose, and denies foot ulcerations, paresthesia of the feet, polydipsia, polyuria and visual disturbances. Last A1C in the office was:  Lab Results  Component Value Date   HGBA1C 5.3 05/30/2021   Last GFR Lab Results  Component Value Date   EGFR 71 05/30/2021   BMI is Body mass index is 30.15 kg/m., he has been working on diet and exercise. He is walking and doing activity in the yard.  He has been eating more sweets and carbohydrates Wt Readings from Last  3 Encounters:  02/06/22 241 lb 3.2 oz (109.4 kg)  05/30/21 233 lb 6.4 oz (105.9 kg)  02/03/21 237 lb (107.5 kg)    Patient is on Vitamin D supplement.   Lab Results  Component Value Date   VD25OH 84  05/30/2021      BPH Currently he is symptomatic and denies or endorses nocturia x 1-2 a night and denies urinary hesitancy, urinary frequency, incomplete voiding, double voiding, weak stream, perineal discomfort, dysuria, hematuria, abdominal pain, flank pain and testicular pain.  He is taking Tamulosin (Flomax). He is not following with urology at this time and last results were in normal. Lab Results  Component Value Date   PSA 0.48 05/30/2021   He follows with Dr. 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 Last visit was 01/2022  Medication Review:   Current Outpatient Medications (Cardiovascular):    enalapril (VASOTEC) 20 MG tablet, TAKE 1 TABLET BY MOUTH DAILY FOR BLOOD PRESSURE   hydrochlorothiazide (HYDRODIURIL) 12.5 MG tablet, TAKE 1 TABLET BY MOUTH EVERY MORNING FOR BLOOD PRESSURE AND FLUID RETENTION OR ANKLE SWELLING   rosuvastatin (CRESTOR) 10 MG tablet, Take  1 tablet  Daily  for Cholesterol   Current Outpatient Medications (Analgesics):    aspirin 81 MG EC tablet, Take 81 mg by mouth daily.   Current Outpatient Medications (Other):    Cholecalciferol (VITAMIN D3) 10000 UNITS capsule, Take 10,000 Units by mouth daily.   latanoprost (XALATAN) 0.005 % ophthalmic solution, Place 1 drop into both eyes at bedtime.   magnesium oxide (MAG-OX) 400 MG tablet, Take 400 mg by mouth 2 (two) times daily.   Multiple Vitamin (MULTIVITAMIN) tablet, Take 1 tablet by mouth daily.   Omega-3 Fatty Acids (FISH OIL) 1200 MG CAPS, Take 1 capsule by mouth 2 (two) times daily.   tamsulosin (FLOMAX) 0.4 MG CAPS capsule, TAKE 1 CAPSULE BY MOUTH AT BEDTIME FOR PROSTATE   vitamin C (ASCORBIC ACID) 500 MG tablet, Take 500 mg by mouth daily.  Allergies: Allergies  Allergen Reactions   Viagra [Sildenafil Citrate]     Headache    Current Problems (verified) has Hyperlipidemia; Hypertension; Other abnormal glucose; Vitamin D deficiency;  Benign prostatic hyperplasia; Glaucoma; DDD (degenerative disc disease), lumbar; Medication management; Overweight (BMI 25.0-29.9); Depression, major, in remission ([place]); Encounter for Medicare annual wellness exam; and Decreased hearing on their problem list.  Screening Tests Immunization History  Administered Date(s) Administered   Influenza, High Dose Seasonal PF 04/01/2019   Influenza-Unspecified 04/30/2014, 04/12/2020   Moderna SARS-COV2 Booster Vaccination 06/03/2020   Moderna Sars-Covid-2 Vaccination 10/13/2019, 11/11/2019   Pneumococcal Conjugate-13 06/08/2014   Pneumococcal Polysaccharide-23 08/09/2009   Td 08/09/2009    Preventative care: Last colonoscopy: 2019, no repeat due to age  Prior vaccinations: TD or Tdap: 2011  Influenza: 04/12/2020 Pneumococcal: 2011 Prevnar13: 2013 Shingles/Zostavax: Declined   Names of Other Physician/Practitioners you currently use: 1. [facility] here for primary care 2. Eye Exam  Dr Kathlen Mody 01/2022 3. Dental Exam Dr Scheryl Darter  10/22  Patient Care Team: Unk Pinto, MD as PCP - General (Internal Medicine) Josue Hector, MD as Consulting Physician (Cardiology) Laurence Spates, MD (Inactive) as Consulting Physician (Gastroenterology)  Surgical: He  has a past surgical history that includes Inguinal hernia repair; Laparoscopic appendectomy; and Cardiac catheterization (1999). Family His family history includes Alzheimer's disease in his mother; Cancer in his father; Hyperlipidemia in his father and sister; Hypertension in his father, mother, and sister;  Lymphoma in his father. Social history  He reports that he quit smoking about 20 years ago. His smoking use included cigarettes. He has never used smokeless tobacco. He reports that he does not drink alcohol and does not use drugs.  MEDICARE WELLNESS OBJECTIVES: Physical activity: Current Exercise Habits: The patient does not participate in regular  exercise at present, Exercise limited by: None identified Cardiac risk factors: Cardiac Risk Factors include: advanced age (>39mn, >>16women);dyslipidemia;hypertension;sedentary lifestyle;obesity (BMI >30kg/m2);male gender Depression/mood screen:      02/06/2022    3:43 PM  Depression screen PHQ 2/9  Decreased Interest 0  Down, Depressed, Hopeless 0  PHQ - 2 Score 0    ADLs:     02/06/2022    3:43 PM 05/29/2021   10:32 PM  In your present state of health, do you have any difficulty performing the following activities:  Hearing? 0 0  Vision? 0 0  Difficulty concentrating or making decisions? 0 0  Walking or climbing stairs? 0 0  Dressing or bathing? 0 0  Doing errands, shopping? 0 0     Cognitive Testing  Alert? Yes  Normal Appearance?Yes  Oriented to person? Yes  Place? Yes   Time? Yes  Recall of three objects?  Yes  Can perform simple calculations? Yes  Displays appropriate judgment?Yes  Can read the correct time from a watch face?Yes  EOL planning: Does Patient Have a Medical Advance Directive?: Yes Type of Advance Directive: Living will, Healthcare Power of Attorney Does patient want to make changes to medical advance directive?: No - Patient declined Copy of HHiddenitein Chart?: No - copy requested   Objective:   Today's Vitals   02/06/22 1520  BP: 140/82  Pulse: 75  Temp: (!) 97.3 F (36.3 C)  SpO2: 97%  Weight: 241 lb 3.2 oz (109.4 kg)     Body mass index is 30.15 kg/m.  General appearance: alert, no distress, WD/WN, male HEENT: normocephalic, sclerae anicteric, TMs pearly, nares patent, no discharge or erythema, pharynx normal Oral cavity: MMM, no lesions Neck: supple, no lymphadenopathy, no thyromegaly, no masses Heart: RRR, normal S1, S2, no murmurs Lungs: CTA bilaterally, no wheezes, rhonchi, or rales Abdomen: +bs, soft, non tender, non distended, no masses, no hepatomegaly, no splenomegaly Musculoskeletal: nontender, no  swelling, no obvious deformity Extremities: no edema, no cyanosis, no clubbing Pulses: 2+ symmetric, upper and lower extremities, normal cap refill Neurological: alert, oriented x 3, CN2-12 intact, strength normal upper extremities and lower extremities, sensation normal throughout checked with monofilament, DTRs 2+ throughout, no cerebellar signs, gait normal,  Psychiatric: normal affect, behavior normal, pleasant     Medicare Attestation I have personally reviewed: The patient's medical and social history Their use of alcohol, tobacco or illicit drugs Their current medications and supplements The patient's functional ability including ADLs,fall risks, home safety risks, cognitive, and hearing and visual impairment Diet and physical activities Evidence for depression or mood disorders  The patient's weight, height, BMI, and visual acuity have been recorded in the chart.  I have made referrals, counseling, and provided education to the patient based on review of the above and I have provided the patient with a written personalized care plan for preventive services.    DMagda BernheimANP-C  [facility]  02/06/2022

## 2022-02-06 ENCOUNTER — Ambulatory Visit (INDEPENDENT_AMBULATORY_CARE_PROVIDER_SITE_OTHER): Payer: Medicare Other | Admitting: Nurse Practitioner

## 2022-02-06 ENCOUNTER — Encounter: Payer: Self-pay | Admitting: Nurse Practitioner

## 2022-02-06 VITALS — BP 140/82 | HR 75 | Temp 97.3°F | Wt 241.2 lb

## 2022-02-06 DIAGNOSIS — Z79899 Other long term (current) drug therapy: Secondary | ICD-10-CM

## 2022-02-06 DIAGNOSIS — K219 Gastro-esophageal reflux disease without esophagitis: Secondary | ICD-10-CM | POA: Diagnosis not present

## 2022-02-06 DIAGNOSIS — Z0000 Encounter for general adult medical examination without abnormal findings: Secondary | ICD-10-CM

## 2022-02-06 DIAGNOSIS — E782 Mixed hyperlipidemia: Secondary | ICD-10-CM | POA: Diagnosis not present

## 2022-02-06 DIAGNOSIS — R6889 Other general symptoms and signs: Secondary | ICD-10-CM | POA: Diagnosis not present

## 2022-02-06 DIAGNOSIS — H9193 Unspecified hearing loss, bilateral: Secondary | ICD-10-CM

## 2022-02-06 DIAGNOSIS — E559 Vitamin D deficiency, unspecified: Secondary | ICD-10-CM | POA: Diagnosis not present

## 2022-02-06 DIAGNOSIS — I10 Essential (primary) hypertension: Secondary | ICD-10-CM | POA: Diagnosis not present

## 2022-02-06 DIAGNOSIS — R7309 Other abnormal glucose: Secondary | ICD-10-CM

## 2022-02-06 DIAGNOSIS — Z0001 Encounter for general adult medical examination with abnormal findings: Secondary | ICD-10-CM | POA: Diagnosis not present

## 2022-02-06 DIAGNOSIS — N401 Enlarged prostate with lower urinary tract symptoms: Secondary | ICD-10-CM

## 2022-02-06 DIAGNOSIS — H409 Unspecified glaucoma: Secondary | ICD-10-CM | POA: Diagnosis not present

## 2022-02-06 DIAGNOSIS — E669 Obesity, unspecified: Secondary | ICD-10-CM

## 2022-02-06 DIAGNOSIS — E663 Overweight: Secondary | ICD-10-CM

## 2022-02-08 LAB — COMPLETE METABOLIC PANEL WITH GFR
AG Ratio: 2.1 (calc) (ref 1.0–2.5)
ALT: 26 U/L (ref 9–46)
AST: 23 U/L (ref 10–35)
Albumin: 4.7 g/dL (ref 3.6–5.1)
Alkaline phosphatase (APISO): 53 U/L (ref 35–144)
BUN/Creatinine Ratio: 10 (calc) (ref 6–22)
BUN: 13 mg/dL (ref 7–25)
CO2: 31 mmol/L (ref 20–32)
Calcium: 9.6 mg/dL (ref 8.6–10.3)
Chloride: 104 mmol/L (ref 98–110)
Creat: 1.34 mg/dL — ABNORMAL HIGH (ref 0.70–1.22)
Globulin: 2.2 g/dL (calc) (ref 1.9–3.7)
Glucose, Bld: 90 mg/dL (ref 65–99)
Potassium: 4.8 mmol/L (ref 3.5–5.3)
Sodium: 143 mmol/L (ref 135–146)
Total Bilirubin: 0.8 mg/dL (ref 0.2–1.2)
Total Protein: 6.9 g/dL (ref 6.1–8.1)
eGFR: 54 mL/min/{1.73_m2} — ABNORMAL LOW (ref >=60)

## 2022-02-08 LAB — CBC WITH DIFFERENTIAL/PLATELET
Absolute Monocytes: 512 cells/uL (ref 200–950)
Basophils Absolute: 31 cells/uL (ref 0–200)
Basophils Relative: 0.5 %
Eosinophils Absolute: 110 cells/uL (ref 15–500)
Eosinophils Relative: 1.8 %
HCT: 43.5 % (ref 38.5–50.0)
Hemoglobin: 14.9 g/dL (ref 13.2–17.1)
Lymphs Abs: 2153 cells/uL (ref 850–3900)
MCH: 33.7 pg — ABNORMAL HIGH (ref 27.0–33.0)
MCHC: 34.3 g/dL (ref 32.0–36.0)
MCV: 98.4 fL (ref 80.0–100.0)
MPV: 10.2 fL (ref 7.5–12.5)
Monocytes Relative: 8.4 %
Neutro Abs: 3294 cells/uL (ref 1500–7800)
Neutrophils Relative %: 54 %
Platelets: 149 10*3/uL (ref 140–400)
RBC: 4.42 10*6/uL (ref 4.20–5.80)
RDW: 12.3 % (ref 11.0–15.0)
Total Lymphocyte: 35.3 %
WBC: 6.1 10*3/uL (ref 3.8–10.8)

## 2022-02-08 LAB — LIPID PANEL
Cholesterol: 138 mg/dL (ref <200)
HDL: 51 mg/dL (ref >=40)
LDL Cholesterol (Calc): 62 mg/dL (calc)
Non-HDL Cholesterol (Calc): 87 mg/dL (calc) (ref <130)
Total CHOL/HDL Ratio: 2.7 (calc) (ref <5.0)
Triglycerides: 186 mg/dL — ABNORMAL HIGH (ref <150)

## 2022-02-08 LAB — HEMOGLOBIN A1C
Hgb A1c MFr Bld: 5.4 % of total Hgb (ref <5.7)
Mean Plasma Glucose: 108 mg/dL
eAG (mmol/L): 6 mmol/L

## 2022-02-08 LAB — TSH: TSH: 2.18 mIU/L (ref 0.40–4.50)

## 2022-03-18 DIAGNOSIS — H6993 Unspecified Eustachian tube disorder, bilateral: Secondary | ICD-10-CM | POA: Diagnosis not present

## 2022-03-18 DIAGNOSIS — J0100 Acute maxillary sinusitis, unspecified: Secondary | ICD-10-CM | POA: Diagnosis not present

## 2022-03-25 ENCOUNTER — Other Ambulatory Visit: Payer: Self-pay | Admitting: Nurse Practitioner

## 2022-03-25 DIAGNOSIS — N401 Enlarged prostate with lower urinary tract symptoms: Secondary | ICD-10-CM

## 2022-03-26 ENCOUNTER — Other Ambulatory Visit: Payer: Self-pay | Admitting: Internal Medicine

## 2022-03-26 DIAGNOSIS — N401 Enlarged prostate with lower urinary tract symptoms: Secondary | ICD-10-CM

## 2022-03-26 MED ORDER — TAMSULOSIN HCL 0.4 MG PO CAPS: ORAL_CAPSULE | ORAL | 3 refills | Status: DC

## 2022-04-06 ENCOUNTER — Other Ambulatory Visit: Payer: Self-pay | Admitting: Nurse Practitioner

## 2022-04-06 DIAGNOSIS — I10 Essential (primary) hypertension: Secondary | ICD-10-CM

## 2022-04-26 ENCOUNTER — Encounter: Payer: Self-pay | Admitting: Internal Medicine

## 2022-05-29 ENCOUNTER — Encounter: Payer: Self-pay | Admitting: Internal Medicine

## 2022-05-29 HISTORY — DX: Morbid (severe) obesity due to excess calories: E66.01

## 2022-05-29 NOTE — Progress Notes (Incomplete)
Annual  Screening/Preventative Visit  & Comprehensive Evaluation & Examination  Future Appointments  Date Time Provider Department  05/30/2022                      cpe 11:00 AM Unk Pinto, MD [facility]  02/07/2023                       wellness  4:00 PM Alycia Rossetti, NP [facility]  06/05/2023                        cpe 11:00 AM Unk Pinto, MD [facility]           This very nice 81 y.o. MWM presents for a Screening /Preventative Visit & comprehensive evaluation and management of multiple medical co-morbidities.  Patient has been followed for HTN, HLD, Prediabetes and Vitamin D Deficiency.  Patient has hx/o Major Depression & is in remission.        HTN predates since  58. Patient's BP has been controlled at home.  Today's BP is at goal - 138/70. Patient denies any cardiac symptoms as chest pain, palpitations, shortness of breath, dizziness or ankle swelling.        Patient's hyperlipidemia is controlled with diet and Pravastatin. Patient denies myalgias or other medication SE's. Last lipids were at goal with sl elevated Trig's :  Lab Results  Component Value Date   CHOL 149 02/03/2021   HDL 54 02/03/2021   LDLCALC 70 02/03/2021   TRIG 169 (H) 02/03/2021   CHOLHDL 2.8 02/03/2021         Patient has hx/o prediabetes (A1c 5.7%  /2011) and patient denies reactive hypoglycemic symptoms, visual blurring, diabetic polys or paresthesias. Last A1c was at goal:   Lab Results  Component Value Date   HGBA1C 5.4 02/03/2021          Finally, patient has history of Vitamin D Deficiency ("36" /2008) and last vitamin D was at goal:   Lab Results  Component Value Date   VD25OH 97 02/03/2021     Current Outpatient Medications on File Prior to Visit  Medication Sig  . aspirin 81 MG EC tablet Take daily.  Marland Kitchen VITAMIN D 19417 UNITS capsule Take daily.  . enalapril (VASOTEC) 20 MG tablet Take  1 tablet  Daily    . hydrochlorothiazide  12.5 MG tablet TAKE 1 TABLET  EVERY MORNING   . XALATAN 0.005 % ophth soln Place 1 drop into both eyes at bedtime.  . magnesium 400 MG  Take  2  times daily.  . Multiple Vitamin  Take 1 tablet , daily.  . Omega-3 FISH OIL 1200 MG CAPS Take 1 capsule  2 times daily.  . rosuvastatin  10 MG tablet Take  1 tablet  Daily    . tamsulosin 0.4 MG  TAKE 1 CAPSULE AT BEDTIME FOR PROSTATE  . vitamin C 500 MG tablet Take  daily.    Allergies  Allergen Reactions  . Viagra [Sildenafil Citrate]     Headache    Past Medical History:  Diagnosis Date  . BPH (benign prostatic hypertrophy)   . Colon polyps   . Elevated hemoglobin A1c   . Glaucoma   . Hypertension   . Vitamin D deficiency      Health Maintenance  Topic Date Due  . Zoster Vaccines- Shingrix (1 of 2) Never done  . COVID-19 Vaccine (3 - Moderna)  12/09/2019  . INFLUENZA VACCINE  02/28/2021  . TETANUS/TDAP  02/14/2022 (Originally 08/10/2019)  . Hepatitis C Screening  02/14/2022 (Originally 01/06/1959)  . Pneumonia Vaccine  Completed  . HPV VACCINES  Aged Out    Immunization History  Administered Date(s) Administered  . Influenza, High Dose  04/01/2019  . Influenza 04/30/2014, 04/12/2020  . Moderna Sars-Covid-2 Vacci 10/13/2019, 11/11/2019  . Pneumococcal -13 06/08/2014  . Pneumococcal -23 08/09/2009  . Td 08/09/2009    Last Colon - 09/07/2017 - Dr Brynda Rim - No F/U due to age   Past Surgical History:  Procedure Laterality Date  . CARDIAC CATHETERIZATION  1999  . INGUINAL HERNIA REPAIR    . LAPAROSCOPIC APPENDECTOMY       Family History  Problem Relation Age of Onset  . Hypertension Mother   . Alzheimer's disease Mother   . Hyperlipidemia Father   . Hypertension Father   . Cancer Father        esophagus  . Lymphoma Father   . Hypertension Sister   . Hyperlipidemia Sister      Social History   Tobacco Use  . Smoking status: Former    Types: Cigarettes    Quit date: 07/31/2001    Years since quitting: 19.8  . Smokeless tobacco:  Never  Substance Use Topics  . Alcohol use: No  . Drug use: No      ROS Constitutional: Denies fever, chills, weight loss/gain, headaches, insomnia,  night sweats or change in appetite. Does c/o fatigue. Eyes: Denies redness, blurred vision, diplopia, discharge, itchy or watery eyes.  ENT: Denies discharge, congestion, post nasal drip, epistaxis, sore throat, earache, hearing loss, dental pain, Tinnitus, Vertigo, Sinus pain or snoring.  Cardio: Denies chest pain, palpitations, irregular heartbeat, syncope, dyspnea, diaphoresis, orthopnea, PND, claudication or edema Respiratory: denies cough, dyspnea, DOE, pleurisy, hoarseness, laryngitis or wheezing.  Gastrointestinal: Denies dysphagia, heartburn, reflux, water brash, pain, cramps, nausea, vomiting, bloating, diarrhea, constipation, hematemesis, melena, hematochezia, jaundice or hemorrhoids Genitourinary: Denies dysuria, frequency, discharge, hematuria or flank pain. Has urgency, nocturia x 2-3 & occasional hesitancy. Musculoskeletal: Denies arthralgia, myalgia, stiffness, Jt. Swelling, pain, limp or strain/sprain. Denies Falls. Skin: Denies puritis, rash, hives, warts, acne, eczema or change in skin lesion Neuro: No weakness, tremor, incoordination, spasms, paresthesia or pain Psychiatric: Denies confusion, memory loss or sensory loss. Denies Depression. Endocrine: Denies change in weight, skin, hair change, nocturia, and paresthesia, diabetic polys, visual blurring or hyper / hypo glycemic episodes.  Heme/Lymph: No excessive bleeding, bruising or enlarged lymph nodes.   Physical Exam  There were no vitals taken for this visit.  General Appearance: Well nourished and well groomed and in no apparent distress.  Eyes: PERRLA, EOMs, conjunctiva no swelling or erythema, normal fundi and vessels. Sinuses: No frontal/maxillary tenderness ENT/Mouth: EACs patent / TMs  nl. Nares clear without erythema, swelling, mucoid exudates. Oral hygiene  is good. No erythema, swelling, or exudate. Tongue normal, non-obstructing. Tonsils not swollen or erythematous. Hearing normal.  Neck: Supple, thyroid not palpable. No bruits, nodes or JVD. Respiratory: Respiratory effort normal.  BS equal and clear bilateral without rales, rhonci, wheezing or stridor. Cardio: Heart sounds are normal with regular rate and rhythm and no murmurs, rubs or gallops. Peripheral pulses are normal and equal bilaterally without edema. No aortic or femoral bruits. Chest: symmetric with normal excursions and percussion.  Abdomen: Soft, with Nl bowel sounds. Nontender, no guarding, rebound, hernias, masses, or organomegaly.  Lymphatics: Non tender without lymphadenopathy.  Musculoskeletal: Full ROM all  peripheral extremities, joint stability, 5/5 strength, and normal gait. Skin: Warm and dry without rashes, lesions, cyanosis, clubbing or  ecchymosis.  Neuro: Cranial nerves intact, reflexes equal bilaterally. Normal muscle tone, no cerebellar symptoms. Sensation intact.  Pysch: Alert and oriented X 3 with normal affect, insight and judgment appropriate.   Assessment and Plan  1. Annual Preventative/Screening Exam    2. Essential hypertension  - EKG 12-Lead - [place], RETROPERITNL ABD,  LTD - Microalbumin / creatinine urine ratio - CBC with Differential/Platelet - COMPLETE METABOLIC PANEL WITH GFR - Magnesium - TSH  3. Hyperlipidemia, mixed  - EKG 12-Lead - [place], RETROPERITNL ABD,  LTD - Lipid panel  4. Abnormal glucose  - EKG 12-Lead - [place], RETROPERITNL ABD,  LTD - Hemoglobin A1c - Insulin, random  5. Vitamin D deficiency  - VITAMIN D 25 Hydroxy   6. BPH with obstruction/lower urinary tract symptoms  - PSA  7. Screening for colorectal cancer  - POC Hemoccult Bld/Stl   8. Prostate cancer screening  - PSA  9. Screening for ischemic heart disease  - EKG 12-Lead  10. FH: hypertension  - EKG 12-Lead - [place], RETROPERITNL ABD,  LTD  11. Former  smoker  - EKG 12-Lead - [place], RETROPERITNL ABD,  LTD  12. Screening for AAA (aortic abdominal aneurysm)  - [place], RETROPERITNL ABD,  LTD  13. Medication management  - Urinalysis, Routine w reflex microscopic - Microalbumin / creatinine urine ratio - CBC with Differential/Platelet - COMPLETE METABOLIC PANEL WITH GFR - Magnesium - Lipid panel - TSH - Hemoglobin A1c - Insulin, random - VITAMIN D 25 Hydroxy           Patient was counseled in prudent diet, weight control to achieve/maintain BMI less than 25, BP monitoring, regular exercise and medications as discussed.  Discussed med effects and SE's. Routine screening labs and tests as requested with regular follow-up as recommended. Over 40 minutes of exam, counseling, chart review and high complex critical decision making was performed   Kirtland Bouchard, MD

## 2022-05-29 NOTE — Patient Instructions (Addendum)

## 2022-05-29 NOTE — Progress Notes (Unsigned)
Annual  Screening/Preventative Visit  & Comprehensive Evaluation & Examination  Future Appointments  Date Time Provider Department  05/30/2022 11:00 AM Unk Pinto, MD [facility]  02/07/2023  4:00 PM Alycia Rossetti, NP [facility]  06/05/2023 11:00 AM Unk Pinto, MD [facility]           This very nice 81 y.o. MWM presents for a Screening /Preventative Visit & comprehensive evaluation and management of multiple medical co-morbidities.  Patient has been followed for HTN, HLD, Prediabetes and Vitamin D Deficiency.       HTN predates since  53. Patient's BP has been controlled at home.  Today's BP is at goal - 138/70. Patient denies any cardiac symptoms as chest pain, palpitations, shortness of breath, dizziness or ankle swelling.        Patient's hyperlipidemia is controlled with diet and Pravastatin. Patient denies myalgias or other medication SE's. Last lipids were at goal with sl elevated Trig's :  Lab Results  Component Value Date   CHOL 149 02/03/2021   HDL 54 02/03/2021   LDLCALC 70 02/03/2021   TRIG 169 (H) 02/03/2021   CHOLHDL 2.8 02/03/2021         Patient has hx/o prediabetes (A1c 5.7%  /2011) and patient denies reactive hypoglycemic symptoms, visual blurring, diabetic polys or paresthesias. Last A1c was at goal:   Lab Results  Component Value Date   HGBA1C 5.4 02/03/2021          Finally, patient has history of Vitamin D Deficiency ("36" /2008) and last vitamin D was at goal:   Lab Results  Component Value Date   VD25OH 97 02/03/2021     Current Outpatient Medications on File Prior to Visit  Medication Sig   aspirin 81 MG EC tablet Take daily.   VITAMIN D 37628 UNITS capsule Take daily.   enalapril (VASOTEC) 20 MG tablet Take  1 tablet  Daily     hydrochlorothiazide  12.5 MG tablet TAKE 1 TABLET EVERY MORNING    XALATAN 0.005 % ophth soln Place 1 drop into both eyes at bedtime.   magnesium 400 MG  Take  2  times daily.   Multiple  Vitamin  Take 1 tablet , daily.   Omega-3 FISH OIL 1200 MG CAPS Take 1 capsule  2 times daily.   rosuvastatin  10 MG tablet Take  1 tablet  Daily     tamsulosin 0.4 MG  TAKE 1 CAPSULE AT BEDTIME FOR PROSTATE   vitamin C 500 MG tablet Take  daily.    Allergies  Allergen Reactions   Viagra [Sildenafil Citrate]     Headache    Past Medical History:  Diagnosis Date   BPH (benign prostatic hypertrophy)    Colon polyps    Elevated hemoglobin A1c    Glaucoma    Hypertension    Vitamin D deficiency      Health Maintenance  Topic Date Due   Zoster Vaccines- Shingrix (1 of 2) Never done   COVID-19 Vaccine (3 - Moderna) 12/09/2019   INFLUENZA VACCINE  02/28/2021   TETANUS/TDAP  02/14/2022 (Originally 08/10/2019)   Hepatitis C Screening  02/14/2022 (Originally 01/06/1959)   Pneumonia Vaccine  Completed   HPV VACCINES  Aged Out    Immunization History  Administered Date(s) Administered   Influenza, High Dose  04/01/2019   Influenza 04/30/2014, 04/12/2020   Moderna Sars-Covid-2 Vacci 10/13/2019, 11/11/2019   Pneumococcal -13 06/08/2014   Pneumococcal -23 08/09/2009   Td 08/09/2009  Last Colon - 09/07/2017 - Dr Brynda Rim - No F/U due to age   Past Surgical History:  Procedure Laterality Date   CARDIAC CATHETERIZATION  [place]       Family History  Problem Relation Age of Onset   Hypertension Mother    Alzheimer's disease Mother    Hyperlipidemia Father    Hypertension Father    Cancer Father        esophagus   Lymphoma Father    Hypertension Sister    Hyperlipidemia Sister      Social History   Tobacco Use   Smoking status: Former    Types: Cigarettes    Quit date: 07/31/2001    Years since quitting: 19.8   Smokeless tobacco: Never  Substance Use Topics   Alcohol use: No   Drug use: No      ROS Constitutional: Denies fever, chills, weight loss/gain, headaches, insomnia,  night sweats or change in  appetite. Does c/o fatigue. Eyes: Denies redness, blurred vision, diplopia, discharge, itchy or watery eyes.  ENT: Denies discharge, congestion, post nasal drip, epistaxis, sore throat, earache, hearing loss, dental pain, Tinnitus, Vertigo, Sinus pain or snoring.  Cardio: Denies chest pain, palpitations, irregular heartbeat, syncope, dyspnea, diaphoresis, orthopnea, PND, claudication or edema Respiratory: denies cough, dyspnea, DOE, pleurisy, hoarseness, laryngitis or wheezing.  Gastrointestinal: Denies dysphagia, heartburn, reflux, water brash, pain, cramps, nausea, vomiting, bloating, diarrhea, constipation, hematemesis, melena, hematochezia, jaundice or hemorrhoids Genitourinary: Denies dysuria, frequency, discharge, hematuria or flank pain. Has urgency, nocturia x 2-3 & occasional hesitancy. Musculoskeletal: Denies arthralgia, myalgia, stiffness, Jt. Swelling, pain, limp or strain/sprain. Denies Falls. Skin: Denies puritis, rash, hives, warts, acne, eczema or change in skin lesion Neuro: No weakness, tremor, incoordination, spasms, paresthesia or pain Psychiatric: Denies confusion, memory loss or sensory loss. Denies Depression. Endocrine: Denies change in weight, skin, hair change, nocturia, and paresthesia, diabetic polys, visual blurring or hyper / hypo glycemic episodes.  Heme/Lymph: No excessive bleeding, bruising or enlarged lymph nodes.   Physical Exam  There were no vitals taken for this visit.  General Appearance: Well nourished and well groomed and in no apparent distress.  Eyes: PERRLA, EOMs, conjunctiva no swelling or erythema, normal fundi and vessels. Sinuses: No frontal/maxillary tenderness ENT/Mouth: EACs patent / TMs  nl. Nares clear without erythema, swelling, mucoid exudates. Oral hygiene is good. No erythema, swelling, or exudate. Tongue normal, non-obstructing. Tonsils not swollen or erythematous. Hearing normal.  Neck: Supple, thyroid not palpable. No bruits, nodes  or JVD. Respiratory: Respiratory effort normal.  BS equal and clear bilateral without rales, rhonci, wheezing or stridor. Cardio: Heart sounds are normal with regular rate and rhythm and no murmurs, rubs or gallops. Peripheral pulses are normal and equal bilaterally without edema. No aortic or femoral bruits. Chest: symmetric with normal excursions and percussion.  Abdomen: Soft, with Nl bowel sounds. Nontender, no guarding, rebound, hernias, masses, or organomegaly.  Lymphatics: Non tender without lymphadenopathy.  Musculoskeletal: Full ROM all peripheral extremities, joint stability, 5/5 strength, and normal gait. Skin: Warm and dry without rashes, lesions, cyanosis, clubbing or  ecchymosis.  Neuro: Cranial nerves intact, reflexes equal bilaterally. Normal muscle tone, no cerebellar symptoms. Sensation intact.  Pysch: Alert and oriented X 3 with normal affect, insight and judgment appropriate.   Assessment and Plan  1. Annual Preventative/Screening Exam    2. Essential hypertension  - EKG 12-Lead - [place], RETROPERITNL ABD,  LTD - Microalbumin / creatinine  urine ratio - CBC with Differential/Platelet - COMPLETE METABOLIC PANEL WITH GFR - Magnesium - TSH  3. Hyperlipidemia, mixed  - EKG 12-Lead - [place], RETROPERITNL ABD,  LTD - Lipid panel  4. Abnormal glucose  - EKG 12-Lead - [place], RETROPERITNL ABD,  LTD - Hemoglobin A1c - Insulin, random  5. Vitamin D deficiency  - VITAMIN D 25 Hydroxy   6. BPH with obstruction/lower urinary tract symptoms  - PSA  7. Screening for colorectal cancer  - POC Hemoccult Bld/Stl   8. Prostate cancer screening  - PSA  9. Screening for ischemic heart disease  - EKG 12-Lead  10. FH: hypertension  - EKG 12-Lead - [place], RETROPERITNL ABD,  LTD  11. Former smoker  - EKG 12-Lead - [place], RETROPERITNL ABD,  LTD  12. Screening for AAA (aortic abdominal aneurysm)  - [place], RETROPERITNL ABD,  LTD  13. Medication management  - Urinalysis,  Routine w reflex microscopic - Microalbumin / creatinine urine ratio - CBC with Differential/Platelet - COMPLETE METABOLIC PANEL WITH GFR - Magnesium - Lipid panel - TSH - Hemoglobin A1c - Insulin, random - VITAMIN D 25 Hydroxy           Patient was counseled in prudent diet, weight control to achieve/maintain BMI less than 25, BP monitoring, regular exercise and medications as discussed.  Discussed med effects and SE's. Routine screening labs and tests as requested with regular follow-up as recommended. Over 40 minutes of exam, counseling, chart review and high complex critical decision making was performed   Kirtland Bouchard, MD

## 2022-05-30 ENCOUNTER — Ambulatory Visit (INDEPENDENT_AMBULATORY_CARE_PROVIDER_SITE_OTHER): Payer: Medicare Other | Admitting: Internal Medicine

## 2022-05-30 ENCOUNTER — Encounter: Payer: Self-pay | Admitting: Internal Medicine

## 2022-05-30 VITALS — BP 128/80 | HR 57 | Temp 97.8°F | Resp 17 | Ht 75.0 in | Wt 233.6 lb

## 2022-05-30 DIAGNOSIS — Z87891 Personal history of nicotine dependence: Secondary | ICD-10-CM | POA: Diagnosis not present

## 2022-05-30 DIAGNOSIS — Z0000 Encounter for general adult medical examination without abnormal findings: Secondary | ICD-10-CM | POA: Diagnosis not present

## 2022-05-30 DIAGNOSIS — I700 Atherosclerosis of aorta: Secondary | ICD-10-CM | POA: Diagnosis not present

## 2022-05-30 DIAGNOSIS — N401 Enlarged prostate with lower urinary tract symptoms: Secondary | ICD-10-CM

## 2022-05-30 DIAGNOSIS — E559 Vitamin D deficiency, unspecified: Secondary | ICD-10-CM | POA: Diagnosis not present

## 2022-05-30 DIAGNOSIS — Z125 Encounter for screening for malignant neoplasm of prostate: Secondary | ICD-10-CM

## 2022-05-30 DIAGNOSIS — Z1211 Encounter for screening for malignant neoplasm of colon: Secondary | ICD-10-CM

## 2022-05-30 DIAGNOSIS — Z8249 Family history of ischemic heart disease and other diseases of the circulatory system: Secondary | ICD-10-CM | POA: Diagnosis not present

## 2022-05-30 DIAGNOSIS — Z79899 Other long term (current) drug therapy: Secondary | ICD-10-CM

## 2022-05-30 DIAGNOSIS — Z136 Encounter for screening for cardiovascular disorders: Secondary | ICD-10-CM

## 2022-05-30 DIAGNOSIS — I10 Essential (primary) hypertension: Secondary | ICD-10-CM | POA: Diagnosis not present

## 2022-05-30 DIAGNOSIS — R7309 Other abnormal glucose: Secondary | ICD-10-CM

## 2022-05-30 DIAGNOSIS — R42 Dizziness and giddiness: Secondary | ICD-10-CM

## 2022-05-30 DIAGNOSIS — Z0001 Encounter for general adult medical examination with abnormal findings: Secondary | ICD-10-CM

## 2022-05-30 DIAGNOSIS — F325 Major depressive disorder, single episode, in full remission: Secondary | ICD-10-CM

## 2022-05-30 DIAGNOSIS — E782 Mixed hyperlipidemia: Secondary | ICD-10-CM

## 2022-05-30 MED ORDER — MECLIZINE HCL 25 MG PO TABS: ORAL_TABLET | ORAL | 11 refills | Status: AC

## 2022-05-31 NOTE — Progress Notes (Signed)
<><><><><><><><><><><><><><><><><><><><><><><><><><><><><><><><><> <><><><><><><><><><><><><><><><><><><><><><><><><><><><><><><><><>  -   PSA - Very low to r/o Prostate Cancer  <><><><><><><><><><><><><><><><><><><><><><><><><><><><><><><><><>  -  Total  Chol  = 116     - Excellent            (  Ideal  or  Goal is less than 180  !  )  & -  Bad / Dangerous LDL  Chol =  49    - also Excellent             (  Ideal  or  Goal is less than 70  !  )    -  Both   Very low risk for Heart Attack  / Stroke <><><><><><><><><><><><><><><><><><><><><><><><><><><><><><><><><>  -  A1c - Normal - No Diabetes  - Great !  <><><><><><><><><><><><><><><><><><><><><><><><><><><><><><><><><>  - Vitamin D = 98 - Excellent -- Please keep dose same  <><><><><><><><><><><><><><><><><><><><><><><><><><><><><><><><><>  - All Else - CBC - Kidneys - Electrolytes - Liver - Magnesium & Thyroid    - all  Normal / OK <><><><><><><><><><><><><><><><><><><><><><><><><><><><><><><><><> <><><><><><><><><><><><><><><><><><><><><><><><><><><><><><><><><>

## 2022-06-01 LAB — LIPID PANEL
Cholesterol: 116 mg/dL (ref <200)
HDL: 48 mg/dL (ref >=40)
LDL Cholesterol (Calc): 49 mg/dL (calc)
Non-HDL Cholesterol (Calc): 68 mg/dL (calc) (ref <130)
Total CHOL/HDL Ratio: 2.4 (calc) (ref <5.0)
Triglycerides: 107 mg/dL (ref <150)

## 2022-06-01 LAB — TSH: TSH: 1.72 mIU/L (ref 0.40–4.50)

## 2022-06-01 LAB — CBC WITH DIFFERENTIAL/PLATELET
Absolute Monocytes: 470 cells/uL (ref 200–950)
Basophils Absolute: 29 cells/uL (ref 0–200)
Basophils Relative: 0.6 %
Eosinophils Absolute: 88 cells/uL (ref 15–500)
Eosinophils Relative: 1.8 %
HCT: 43.6 % (ref 38.5–50.0)
Hemoglobin: 14.9 g/dL (ref 13.2–17.1)
Lymphs Abs: 1862 cells/uL (ref 850–3900)
MCH: 33.4 pg — ABNORMAL HIGH (ref 27.0–33.0)
MCHC: 34.2 g/dL (ref 32.0–36.0)
MCV: 97.8 fL (ref 80.0–100.0)
MPV: 10.9 fL (ref 7.5–12.5)
Monocytes Relative: 9.6 %
Neutro Abs: 2450 cells/uL (ref 1500–7800)
Neutrophils Relative %: 50 %
Platelets: 134 10*3/uL — ABNORMAL LOW (ref 140–400)
RBC: 4.46 10*6/uL (ref 4.20–5.80)
RDW: 12.4 % (ref 11.0–15.0)
Total Lymphocyte: 38 %
WBC: 4.9 10*3/uL (ref 3.8–10.8)

## 2022-06-01 LAB — COMPLETE METABOLIC PANEL WITH GFR
AG Ratio: 1.8 (calc) (ref 1.0–2.5)
ALT: 25 U/L (ref 9–46)
AST: 23 U/L (ref 10–35)
Albumin: 4.4 g/dL (ref 3.6–5.1)
Alkaline phosphatase (APISO): 55 U/L (ref 35–144)
BUN: 18 mg/dL (ref 7–25)
CO2: 31 mmol/L (ref 20–32)
Calcium: 9.3 mg/dL (ref 8.6–10.3)
Chloride: 102 mmol/L (ref 98–110)
Creat: 1.04 mg/dL (ref 0.70–1.22)
Globulin: 2.5 g/dL (calc) (ref 1.9–3.7)
Glucose, Bld: 85 mg/dL (ref 65–99)
Potassium: 4.5 mmol/L (ref 3.5–5.3)
Sodium: 140 mmol/L (ref 135–146)
Total Bilirubin: 1.4 mg/dL — ABNORMAL HIGH (ref 0.2–1.2)
Total Protein: 6.9 g/dL (ref 6.1–8.1)
eGFR: 73 mL/min/{1.73_m2} (ref >=60)

## 2022-06-01 LAB — MICROALBUMIN / CREATININE URINE RATIO
Creatinine, Urine: 31 mg/dL (ref 20–320)
Microalb, Ur: <0.2 mg/dL

## 2022-06-01 LAB — URINALYSIS, ROUTINE W REFLEX MICROSCOPIC
Bilirubin Urine: NEGATIVE
Glucose, UA: NEGATIVE
Hgb urine dipstick: NEGATIVE
Ketones, ur: NEGATIVE
Leukocytes,Ua: NEGATIVE
Nitrite: NEGATIVE
Protein, ur: NEGATIVE
Specific Gravity, Urine: 1.006 (ref 1.001–1.035)
pH: 6.5 (ref 5.0–8.0)

## 2022-06-01 LAB — VITAMIN D 25 HYDROXY (VIT D DEFICIENCY, FRACTURES): Vit D, 25-Hydroxy: 98 ng/mL (ref 30–100)

## 2022-06-01 LAB — HEMOGLOBIN A1C
Hgb A1c MFr Bld: 5.6 % of total Hgb (ref <5.7)
Mean Plasma Glucose: 114 mg/dL
eAG (mmol/L): 6.3 mmol/L

## 2022-06-01 LAB — INSULIN, RANDOM: Insulin: 15.2 u[IU]/mL

## 2022-06-01 LAB — PSA: PSA: 0.42 ng/mL (ref <=4.00)

## 2022-06-01 LAB — MAGNESIUM: Magnesium: 2.1 mg/dL (ref 1.5–2.5)

## 2022-06-02 NOTE — Progress Notes (Signed)
Patient is aware of lab results and instructions. -e welch

## 2022-06-08 ENCOUNTER — Other Ambulatory Visit: Payer: Self-pay

## 2022-06-08 ENCOUNTER — Ambulatory Visit (INDEPENDENT_AMBULATORY_CARE_PROVIDER_SITE_OTHER): Payer: Medicare Other | Admitting: Nurse Practitioner

## 2022-06-08 ENCOUNTER — Encounter: Payer: Self-pay | Admitting: Nurse Practitioner

## 2022-06-08 VITALS — BP 170/80 | HR 92 | Temp 100.0°F | Resp 18 | Ht 75.0 in | Wt 236.0 lb

## 2022-06-08 DIAGNOSIS — R062 Wheezing: Secondary | ICD-10-CM | POA: Diagnosis not present

## 2022-06-08 DIAGNOSIS — R509 Fever, unspecified: Secondary | ICD-10-CM

## 2022-06-08 DIAGNOSIS — R6889 Other general symptoms and signs: Secondary | ICD-10-CM | POA: Diagnosis not present

## 2022-06-08 DIAGNOSIS — G4483 Primary cough headache: Secondary | ICD-10-CM

## 2022-06-08 DIAGNOSIS — R051 Acute cough: Secondary | ICD-10-CM | POA: Diagnosis not present

## 2022-06-08 DIAGNOSIS — R0981 Nasal congestion: Secondary | ICD-10-CM | POA: Diagnosis not present

## 2022-06-08 LAB — POCT INFLUENZA A/B
Influenza A, POC: NEGATIVE
Influenza B, POC: NEGATIVE

## 2022-06-08 LAB — POC COVID19 BINAXNOW: SARS Coronavirus 2 Ag: NEGATIVE

## 2022-06-08 MED ORDER — AZITHROMYCIN 500 MG PO TABS: 500.0000 mg | ORAL_TABLET | Freq: Every day | ORAL | 0 refills | Status: AC

## 2022-06-08 MED ORDER — PREDNISONE 10 MG PO TABS: ORAL_TABLET | ORAL | 0 refills | Status: DC

## 2022-06-08 MED ORDER — PROMETHAZINE-DM 6.25-15 MG/5ML PO SYRP: 5.0000 mL | ORAL_SOLUTION | Freq: Four times a day (QID) | ORAL | 0 refills | Status: DC | PRN

## 2022-06-08 NOTE — Patient Instructions (Signed)

## 2022-06-08 NOTE — Progress Notes (Signed)
Assessment and Plan:  Cody Hamilton was seen today for an episodic visit.  Diagnoses and all order for this visit:  1. Flu-like symptoms Flu Negative Covid Negative  2. Cough headache Tylenol PRN for HA Stay well hydrated Start tmt with abx   - POC COVID-19 - azithromycin (ZITHROMAX) 500 MG tablet; Take 1 tablet (500 mg total) by mouth daily for 10 days. Take 1 tablet daily for 3 days.  Dispense: 10 tablet; Refill: 0  3. Fever, unspecified fever cause Tylenol PRN Stay well hydrated  - azithromycin (ZITHROMAX) 500 MG tablet; Take 1 tablet (500 mg total) by mouth daily for 10 days. Take 1 tablet daily for 3 days.  Dispense: 10 tablet; Refill: 0  4. Acute cough Stay well hydrated Cough syrup PRN  - promethazine-dextromethorphan (PROMETHAZINE-DM) 6.25-15 MG/5ML syrup; Take 5 mLs by mouth 4 (four) times daily as needed for cough.  Dispense: 240 mL; Refill: 0  5. Wheezing Report to ER or call 911 for any increase in difficulty breathing  - predniSONE (DELTASONE) 10 MG tablet; 1 tab 3 x day for 2 days, then 1 tab 2 x day for 2 days, then 1 tab 1 x day for 3 days  Dispense: 13 tablet; Refill: 0  6. Nasal congestion Push fluids Neti Pot Warm steamy shower  - azithromycin (ZITHROMAX) 500 MG tablet; Take 1 tablet (500 mg total) by mouth daily for 10 days. Take 1 tablet daily for 3 days.  Dispense: 10 tablet; Refill: 0  7.  Hypertension Controlled in the home. Above goal in clinic - asymptomatic. Continue to take medications as prescribed Stay well hydrated Report to ER for any increase in stroke like symptoms, including HA, N/V, paralysis, difficulty speaking, trouble walking, confusion, vision changes, CP, heart palpitations, SOB, diaphoresis.   Rest.  If s/s fail to improve discussed CXR to r/o any underlying disease process.   Notify office for further evaluation and treatment, questions or concerns if s/s fail to improve. The risks and benefits of my recommendations,  as well as other treatment options were discussed with the patient today. Questions were answered.  Further disposition pending results of labs. Discussed med's effects and SE's.    Over 20 minutes of exam, counseling, chart review, and critical decision making was performed.   Future Appointments  Date Time Provider Morning Sun  09/04/2022 10:00 AM Alycia Rossetti, NP [facility] None  12/05/2022 10:30 AM Unk Pinto, MD [facility] None  02/07/2023  4:00 PM Alycia Rossetti, NP [facility] None  06/05/2023 11:00 AM Unk Pinto, MD [facility] None    ------------------------------------------------------------------------------------------------------------------   HPI BP (!) 170/80   Pulse 92   Temp 100 F (37.8 C)   Resp 18   Ht '6\' 3"'$  (1.905 m)   Wt 236 lb (107 kg)   SpO2 96%   BMI 29.50 kg/m    Patient complains of symptoms of a URI, possible sinusitis. Symptoms include achiness, congestion, fever 101 F, headache described as tense, nasal congestion, productive cough with  white and yellow colored sputum, sinus pressure, and wheezing. Onset of symptoms was 2 days ago, and has been unchanged since that time. Treatment to date: none.  He does have elevated BP today in clinic. Denies CP, heart palpitations.  Reports medication compliance.  States the BP is well managed in the home.   Last seen in office 05/30/22 with BP 128/80.  Past Medical History:  Diagnosis Date   BPH (benign prostatic hypertrophy)    Colon polyps  Elevated hemoglobin A1c    Glaucoma    Hypertension    Vitamin D deficiency      Allergies  Allergen Reactions   Viagra [Sildenafil Citrate]     Headache    Current Outpatient Medications on File Prior to Visit  Medication Sig   aspirin 81 MG EC tablet Take 81 mg by mouth daily.   Cholecalciferol (VITAMIN D3) 10000 UNITS capsule Take 10,000 Units by mouth daily.   enalapril (VASOTEC) 20 MG tablet TAKE 1 TABLET BY MOUTH DAILY FOR BLOOD  PRESSURE   hydrochlorothiazide (HYDRODIURIL) 12.5 MG tablet TAKE 1 TABLET BY MOUTH EVERY MORNING FOR BLOOD PRESSURE AND FLUID RETENTION OR ANKLE SWELLING   latanoprost (XALATAN) 0.005 % ophthalmic solution Place 1 drop into both eyes at bedtime.   magnesium oxide (MAG-OX) 400 MG tablet Take 400 mg by mouth 2 (two) times daily.   meclizine (ANTIVERT) 25 MG tablet Take 1/2 to 1 tablet 2 to 3 x /day as needed for Dizziness / Vertigo   Multiple Vitamin (MULTIVITAMIN) tablet Take 1 tablet by mouth daily.   Omega-3 Fatty Acids (FISH OIL) 1200 MG CAPS Take 1 capsule by mouth 2 (two) times daily.   rosuvastatin (CRESTOR) 10 MG tablet Take  1 tablet  Daily  for Cholesterol   tamsulosin (FLOMAX) 0.4 MG CAPS capsule Take  1 capsule at Bedtime for Prostate                              /                                         TAKE                               BY                        MOUTH   vitamin C (ASCORBIC ACID) 500 MG tablet Take 500 mg by mouth daily.   No current facility-administered medications on file prior to visit.    ROS: all negative except what is noted in the HPI.   Physical Exam:  BP (!) 170/80   Pulse 92   Temp 100 F (37.8 C)   Resp 18   Ht '6\' 3"'$  (1.905 m)   Wt 236 lb (107 kg)   SpO2 96%   BMI 29.50 kg/m   General Appearance: NAD.  Awake, conversant and cooperative. Eyes: PERRLA, EOMs intact.  Sclera white.  Conjunctiva without erythema. Sinuses: Frontal/maxillary tenderness.  No nasal discharge. Nares patent.  ENT/Mouth: Ext aud canals clear.  Bilateral TMs w/DOL and without erythema or bulging. Hearing intact.  Posterior pharynx without swelling or exudate.  Tonsils without swelling or erythema.  Neck: Supple.  No masses, nodules or thyromegaly. Respiratory: Effort is regular with non-labored breathing. Breath sounds are equal bilaterally with expiratory wheezing posterior lobe.  Cardio: RRR with no MRGs. Brisk peripheral pulses without edema.  Abdomen: Active BS in  all four quadrants.  Soft and non-tender without guarding, rebound tenderness, hernias or masses. Lymphatics: Non tender without lymphadenopathy.  Musculoskeletal: Full ROM, 5/5 strength, normal ambulation.  No clubbing or cyanosis. Skin: Appropriate color for ethnicity. Warm without rashes, lesions, ecchymosis, ulcers.  Neuro: CN II-XII grossly normal. Normal  muscle tone without cerebellar symptoms and intact sensation.   Psych: AO X 3,  appropriate mood and affect, insight and judgment.     Darrol Jump, NP 2:39 PM [facility]

## 2022-07-14 ENCOUNTER — Other Ambulatory Visit: Payer: Self-pay

## 2022-07-14 DIAGNOSIS — Z1211 Encounter for screening for malignant neoplasm of colon: Secondary | ICD-10-CM

## 2022-07-14 LAB — POC HEMOCCULT BLD/STL (HOME/3-CARD/SCREEN)
Card #2 Fecal Occult Blod, POC: NEGATIVE
Card #3 Fecal Occult Blood, POC: NEGATIVE
Fecal Occult Blood, POC: NEGATIVE

## 2022-07-17 DIAGNOSIS — Z1212 Encounter for screening for malignant neoplasm of rectum: Secondary | ICD-10-CM | POA: Diagnosis not present

## 2022-07-17 DIAGNOSIS — Z1211 Encounter for screening for malignant neoplasm of colon: Secondary | ICD-10-CM | POA: Diagnosis not present

## 2022-08-08 DIAGNOSIS — H402211 Chronic angle-closure glaucoma, right eye, mild stage: Secondary | ICD-10-CM | POA: Diagnosis not present

## 2022-08-08 DIAGNOSIS — H402222 Chronic angle-closure glaucoma, left eye, moderate stage: Secondary | ICD-10-CM | POA: Diagnosis not present

## 2022-09-01 NOTE — Progress Notes (Unsigned)
MEDICARE ANNUAL WELLNESS VISIT AND FOLLOW UP   Assessment:   Cody Hamilton was seen today for follow-up and medicare wellness.  Diagnoses and all orders for this visit:  Medicare annual wellness visit, subsequent Yearly  Essential hypertension Continue medication: HCTZ 12.'5mg'$  & enalapril '20mg'$  Monitor blood pressure at home; call if consistently over 130/80 CBC, CMP Continue DASH diet.   Reminder to go to the ER if any CP, SOB, nausea, dizziness, severe HA, changes vision/speech, left arm numbness and tingling and jaw pain.  Hyperlipidemia, mixed Continue medications: Rosuvastatin 10 mg and fish oil Continue low cholesterol diet and exerc Check lipid panel.  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 (LUTS) No change in nocturia x1 Continue tamsulosin0.'4mg'$  nightly  Decreased hearing of both ears Discussed audiology referral Does not want hearing aids Discussed evaluation at [facility]  Obesity Long discussion about weight loss, diet, and exercise Recommended diet heavy in fruits and veggies and low in animal meats, cheeses, and dairy products, appropriate calorie intake Patient will work on increasing activity and limiting saturated fats and simple carbs Follow up at next visit Discussed dietary and exercise modifications  Medication management Continued - TSH  Glaucoma, unspecified glaucoma type, unspecified laterality Continue Xalatan 1drop both eyes Eye exam performed 01/2022    Over 30 minutes of face to face interview, exam, counseling, chart review, and critical decision making was performed  Future Appointments  Date Time Provider [place]  09/04/2022 10:00 AM  Cody Rossetti, NP [facility] None  12/05/2022 10:30 AM Cody Pinto, MD [facility] None  02/07/2023  4:00 PM Cody Rossetti, NP [facility] None  06/05/2023 11:00 AM Cody Pinto, MD [facility] None     Plan:   During the course of the visit the patient was educated and counseled about appropriate screening and preventive services including:   Pneumococcal vaccine  Influenza vaccine Prevnar 13 Td vaccine Screening electrocardiogram Colorectal cancer screening Diabetes screening Glaucoma screening Nutrition counseling    Subjective:  Cody Hamilton is a 82 y.o. male who presents for Medicare Annual Wellness Visit and 3 month follow up for HTN, HLD, BPH, Glaucoma, abnormal glucose, and vitamin D Def.   His blood pressure has been checked at home, today their BP is    BP Readings from Last 3 Encounters:  06/08/22 (!) 170/80  05/30/22 128/80  02/06/22 140/82  He does not workout. He denies chest pain, shortness of breath, dizziness.   He is on cholesterol medication, Rosuvastatin 10 mg daily and denies myalgias. His cholesterol is at goal. The cholesterol last visit was:   Lab Results  Component Value Date   CHOL 116 05/30/2022   HDL 48 05/30/2022   LDLCALC 49 05/30/2022   TRIG 107 05/30/2022   CHOLHDL 2.4 05/30/2022   He has been working on diet and exercise by mean of house and yard work for abnormal glucose, and denies foot ulcerations, paresthesia of the feet, polydipsia, polyuria and visual disturbances. Last A1C in the office was:  Lab Results  Component Value Date   HGBA1C 5.6 05/30/2022   Last GFR Lab Results  Component Value Date   EGFR 73 05/30/2022   BMI is There is no height or weight on file to calculate BMI., he has been working on  diet and exercise. He is walking and doing activity in the yard.  He has been eating more sweets and carbohydrates Wt Readings from Last 3 Encounters:  06/08/22 236 lb (107 kg)  05/30/22 233 lb 9.6 oz (106 kg)   02/06/22 241 lb 3.2 oz (109.4 kg)    Patient is on Vitamin D supplement.   Lab Results  Component Value Date   VD25OH 98 05/30/2022      BPH Currently he is symptomatic and denies or endorses nocturia x 1-2 a night and denies urinary hesitancy, urinary frequency, incomplete voiding, double voiding, weak stream, perineal discomfort, dysuria, hematuria, abdominal pain, flank pain and testicular pain.  He is taking Tamulosin (Flomax). He is not following with urology at this time and last results were in normal. Lab Results  Component Value Date   PSA 0.42 05/30/2022   He follows with Dr. 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 Last visit was 01/2022  Medication Review:  Current Outpatient Medications (Endocrine & Metabolic):    predniSONE (DELTASONE) 10 MG tablet, 1 tab 3 x day for 2 days, then 1 tab 2 x day for 2 days, then 1 tab 1 x day for 3 days  Current Outpatient Medications (Cardiovascular):    enalapril (VASOTEC) 20 MG tablet, TAKE 1 TABLET BY MOUTH DAILY FOR BLOOD PRESSURE   hydrochlorothiazide (HYDRODIURIL) 12.5 MG tablet, TAKE 1 TABLET BY MOUTH EVERY MORNING FOR BLOOD PRESSURE AND FLUID RETENTION OR ANKLE SWELLING   rosuvastatin (CRESTOR) 10 MG tablet, Take  1 tablet  Daily  for Cholesterol  Current Outpatient Medications (Respiratory):    promethazine-dextromethorphan (PROMETHAZINE-DM) 6.25-15 MG/5ML syrup, Take 5 mLs by mouth 4 (four) times daily as needed for cough.  Current Outpatient Medications (Analgesics):    aspirin 81 MG EC tablet, Take 81 mg by mouth daily.   Current Outpatient Medications (Other):    Cholecalciferol (VITAMIN D3) 10000 UNITS capsule, Take 10,000 Units by mouth daily.   latanoprost (XALATAN) 0.005 % ophthalmic solution, Place 1 drop into both eyes at bedtime.   magnesium oxide (MAG-OX) 400 MG tablet, Take 400 mg by mouth 2 (two) times daily.   meclizine (ANTIVERT) 25 MG tablet,  Take 1/2 to 1 tablet 2 to 3 x /day as needed for Dizziness / Vertigo   Multiple Vitamin (MULTIVITAMIN) tablet, Take 1 tablet by mouth daily.   Omega-3 Fatty Acids (FISH OIL) 1200 MG CAPS, Take 1 capsule by mouth 2 (two) times daily.   tamsulosin (FLOMAX) 0.4 MG CAPS capsule, Take  1 capsule at Bedtime for Prostate                              /                                         TAKE                               BY                        MOUTH   vitamin C (ASCORBIC ACID) 500 MG tablet, Take 500 mg by mouth daily.  Allergies: Allergies  Allergen Reactions   Viagra [Sildenafil Citrate]  Headache    Current Problems (verified) has Hyperlipidemia; Hypertension; Other abnormal glucose; Vitamin D deficiency; Benign prostatic hyperplasia; Glaucoma; DDD (degenerative disc disease), lumbar; Medication management; Overweight (BMI 25.0-29.9); Depression, major, in remission ([place]); Encounter for Medicare annual wellness exam; Decreased hearing; and Morbid obesity ([place]) on their problem list.  Screening Tests Immunization History  Administered Date(s) Administered   Influenza, High Dose Seasonal PF 04/01/2019   Influenza-Unspecified 04/30/2014, 04/12/2020   Moderna SARS-COV2 Booster Vaccination 06/03/2020   Moderna Sars-Covid-2 Vaccination 10/13/2019, 11/11/2019   Pneumococcal Conjugate-13 06/08/2014   Pneumococcal Polysaccharide-23 08/09/2009   Td 08/09/2009    Preventative care: Last colonoscopy: 2019, no repeat due to age  Prior vaccinations: TD or Tdap: 2011  Influenza: 04/12/2020 Pneumococcal: 2011 Prevnar13: 2013 Shingles/Zostavax: Declined   Names of Other Physician/Practitioners you currently use: 1. [facility] here for primary care 2. Eye Exam  Dr Kathlen Mody 01/2022 3. Dental Exam Dr Scheryl Darter  10/22  Patient Care Team: Cody Pinto, MD as PCP - General (Internal Medicine) Josue Hector, MD as Consulting Physician  (Cardiology) Laurence Spates, MD (Inactive) as Consulting Physician (Gastroenterology)  Surgical: He  has a past surgical history that includes Inguinal hernia repair; Laparoscopic appendectomy; and Cardiac catheterization (1999). Family His family history includes Alzheimer's disease in his mother; Cancer in his father; Hyperlipidemia in his father and sister; Hypertension in his father, mother, and sister; Lymphoma in his father. Social history  He reports that he quit smoking about 21 years ago. His smoking use included cigarettes. He has never used smokeless tobacco. He reports that he does not drink alcohol and does not use drugs.  MEDICARE WELLNESS OBJECTIVES: Physical activity:   Cardiac risk factors:   Depression/mood screen:      05/29/2022   11:59 PM  Depression screen PHQ 2/9  Decreased Interest 0  Down, Depressed, Hopeless 0  PHQ - 2 Score 0    ADLs:     05/30/2022   12:00 AM 02/06/2022    3:43 PM  In your present state of health, do you have any difficulty performing the following activities:  Hearing? 0 0  Vision? 0 0  Difficulty concentrating or making decisions? 0 0  Walking or climbing stairs? 0 0  Dressing or bathing? 0 0  Doing errands, shopping? 0 0     Cognitive Testing  Alert? Yes  Normal Appearance?Yes  Oriented to person? Yes  Place? Yes   Time? Yes  Recall of three objects?  Yes  Can perform simple calculations? Yes  Displays appropriate judgment?Yes  Can read the correct time from a watch face?Yes  EOL planning:     Objective:   There were no vitals filed for this visit.    There is no height or weight on file to calculate BMI.  General appearance: alert, no distress, WD/WN, male HEENT: normocephalic, sclerae anicteric, TMs pearly, nares patent, no discharge or erythema, pharynx normal Oral cavity: MMM, no lesions Neck: supple, no lymphadenopathy, no thyromegaly, no masses Heart: RRR, normal S1, S2, no murmurs Lungs: CTA  bilaterally, no wheezes, rhonchi, or rales Abdomen: +bs, soft, non tender, non distended, no masses, no hepatomegaly, no splenomegaly Musculoskeletal: nontender, no swelling, no obvious deformity Extremities: no edema, no cyanosis, no clubbing Pulses: 2+ symmetric, upper and lower extremities, normal cap refill Neurological: alert, oriented x 3, CN2-12 intact, strength normal upper extremities and lower extremities, sensation normal throughout checked with monofilament, DTRs 2+ throughout, no cerebellar signs, gait normal,  Psychiatric: normal affect,  behavior normal, pleasant     Medicare Attestation I have personally reviewed: The patient's medical and social history Their use of alcohol, tobacco or illicit drugs Their current medications and supplements The patient's functional ability including ADLs,fall risks, home safety risks, cognitive, and hearing and visual impairment Diet and physical activities Evidence for depression or mood disorders  The patient's weight, height, BMI, and visual acuity have been recorded in the chart.  I have made referrals, counseling, and provided education to the patient based on review of the above and I have provided the patient with a written personalized care plan for preventive services.    Magda Bernheim ANP-C  [facility]  09/01/2022

## 2022-09-04 ENCOUNTER — Encounter: Payer: Self-pay | Admitting: Nurse Practitioner

## 2022-09-04 ENCOUNTER — Ambulatory Visit (INDEPENDENT_AMBULATORY_CARE_PROVIDER_SITE_OTHER): Payer: Medicare Other | Admitting: Nurse Practitioner

## 2022-09-04 VITALS — BP 122/78 | HR 67 | Temp 97.5°F | Resp 16 | Ht 75.0 in | Wt 239.4 lb

## 2022-09-04 DIAGNOSIS — N138 Other obstructive and reflux uropathy: Secondary | ICD-10-CM

## 2022-09-04 DIAGNOSIS — E663 Overweight: Secondary | ICD-10-CM

## 2022-09-04 DIAGNOSIS — I10 Essential (primary) hypertension: Secondary | ICD-10-CM | POA: Diagnosis not present

## 2022-09-04 DIAGNOSIS — Z79899 Other long term (current) drug therapy: Secondary | ICD-10-CM

## 2022-09-04 DIAGNOSIS — K219 Gastro-esophageal reflux disease without esophagitis: Secondary | ICD-10-CM | POA: Diagnosis not present

## 2022-09-04 DIAGNOSIS — Z6829 Body mass index (BMI) 29.0-29.9, adult: Secondary | ICD-10-CM

## 2022-09-04 DIAGNOSIS — E559 Vitamin D deficiency, unspecified: Secondary | ICD-10-CM | POA: Diagnosis not present

## 2022-09-04 DIAGNOSIS — R7309 Other abnormal glucose: Secondary | ICD-10-CM | POA: Diagnosis not present

## 2022-09-04 DIAGNOSIS — F325 Major depressive disorder, single episode, in full remission: Secondary | ICD-10-CM

## 2022-09-04 DIAGNOSIS — H409 Unspecified glaucoma: Secondary | ICD-10-CM | POA: Diagnosis not present

## 2022-09-04 DIAGNOSIS — E782 Mixed hyperlipidemia: Secondary | ICD-10-CM | POA: Diagnosis not present

## 2022-09-04 DIAGNOSIS — H9193 Unspecified hearing loss, bilateral: Secondary | ICD-10-CM | POA: Diagnosis not present

## 2022-09-04 DIAGNOSIS — N401 Enlarged prostate with lower urinary tract symptoms: Secondary | ICD-10-CM

## 2022-09-04 DIAGNOSIS — E669 Obesity, unspecified: Secondary | ICD-10-CM

## 2022-09-04 NOTE — Patient Instructions (Signed)

## 2022-09-05 LAB — COMPLETE METABOLIC PANEL WITH GFR
AG Ratio: 1.8 (calc) (ref 1.0–2.5)
ALT: 25 U/L (ref 9–46)
AST: 23 U/L (ref 10–35)
Albumin: 4.4 g/dL (ref 3.6–5.1)
Alkaline phosphatase (APISO): 52 U/L (ref 35–144)
BUN: 14 mg/dL (ref 7–25)
CO2: 28 mmol/L (ref 20–32)
Calcium: 9.4 mg/dL (ref 8.6–10.3)
Chloride: 103 mmol/L (ref 98–110)
Creat: 0.91 mg/dL (ref 0.70–1.22)
Globulin: 2.4 g/dL (calc) (ref 1.9–3.7)
Glucose, Bld: 89 mg/dL (ref 65–99)
Potassium: 4 mmol/L (ref 3.5–5.3)
Sodium: 141 mmol/L (ref 135–146)
Total Bilirubin: 0.9 mg/dL (ref 0.2–1.2)
Total Protein: 6.8 g/dL (ref 6.1–8.1)
eGFR: 85 mL/min/{1.73_m2} (ref >=60)

## 2022-09-05 LAB — MAGNESIUM: Magnesium: 2.1 mg/dL (ref 1.5–2.5)

## 2022-09-05 LAB — CBC WITH DIFFERENTIAL/PLATELET
Absolute Monocytes: 459 cells/uL (ref 200–950)
Basophils Absolute: 31 cells/uL (ref 0–200)
Basophils Relative: 0.6 %
Eosinophils Absolute: 102 cells/uL (ref 15–500)
Eosinophils Relative: 2 %
HCT: 43.7 % (ref 38.5–50.0)
Hemoglobin: 15.3 g/dL (ref 13.2–17.1)
Lymphs Abs: 1795 cells/uL (ref 850–3900)
MCH: 34.1 pg — ABNORMAL HIGH (ref 27.0–33.0)
MCHC: 35 g/dL (ref 32.0–36.0)
MCV: 97.3 fL (ref 80.0–100.0)
MPV: 10.3 fL (ref 7.5–12.5)
Monocytes Relative: 9 %
Neutro Abs: 2713 cells/uL (ref 1500–7800)
Neutrophils Relative %: 53.2 %
Platelets: 136 10*3/uL — ABNORMAL LOW (ref 140–400)
RBC: 4.49 10*6/uL (ref 4.20–5.80)
RDW: 12.4 % (ref 11.0–15.0)
Total Lymphocyte: 35.2 %
WBC: 5.1 10*3/uL (ref 3.8–10.8)

## 2022-09-05 LAB — LIPID PANEL
Cholesterol: 135 mg/dL (ref <200)
HDL: 54 mg/dL (ref >=40)
LDL Cholesterol (Calc): 61 mg/dL (calc)
Non-HDL Cholesterol (Calc): 81 mg/dL (calc) (ref <130)
Total CHOL/HDL Ratio: 2.5 (calc) (ref <5.0)
Triglycerides: 114 mg/dL (ref <150)

## 2022-09-05 NOTE — Progress Notes (Signed)
Patient is aware of lab results. -e welch

## 2022-10-03 ENCOUNTER — Other Ambulatory Visit: Payer: Self-pay | Admitting: Internal Medicine

## 2022-10-03 DIAGNOSIS — E782 Mixed hyperlipidemia: Secondary | ICD-10-CM

## 2022-11-16 DIAGNOSIS — H402211 Chronic angle-closure glaucoma, right eye, mild stage: Secondary | ICD-10-CM | POA: Diagnosis not present

## 2022-11-16 DIAGNOSIS — H402222 Chronic angle-closure glaucoma, left eye, moderate stage: Secondary | ICD-10-CM | POA: Diagnosis not present

## 2022-11-29 DIAGNOSIS — H402211 Chronic angle-closure glaucoma, right eye, mild stage: Secondary | ICD-10-CM | POA: Diagnosis not present

## 2022-12-04 ENCOUNTER — Encounter: Payer: Self-pay | Admitting: Internal Medicine

## 2022-12-04 NOTE — Progress Notes (Unsigned)
Future Appointments  Date Time Provider Department  12/05/2022                           6 mo ov 10:30 AM Lucky Cowboy, MD [facility]  03/07/2023                           wellness 10:30 AM Raynelle Dick, NP [facility]  06/07/2023                           cpe 11:00 AM Lucky Cowboy, MD [facility]    History of Present Illness:       This very nice 82 y.o. MWM  presents for 6 month follow up with HTN, HLD, Pre-Diabetes and Vitamin D Deficiency. Patient's BPH is controlled on Tamsulosin.         Patient is treated for HTN  circa 1990 & BP has been controlled and today's BP is at goal - 118/70. Patient has had no complaints of any cardiac type chest pain, palpitations, dyspnea / orthopnea / PND, dizziness, claudication, or dependent edema.        Hyperlipidemia is controlled with diet & Pravastatin. Patient denies myalgias or other med SE's. Last Lipids were at goal :  Lab Results  Component Value Date   CHOL 135 09/04/2022   HDL 54 09/04/2022   LDLCALC 61 09/04/2022   TRIG 114 09/04/2022   CHOLHDL 2.5 09/04/2022     Also, the patient has history of PreDiabetes and has had no symptoms of reactive hypoglycemia, diabetic polys, paresthesias or visual blurring.  Last A1c was   Lab Results  Component Value Date   HGBA1C 5.6 05/30/2022                                                         Further, the patient also has history of Vitamin D Deficiency and supplements vitamin D . Last vitamin D was at goal :  Lab Results  Component Value Date   VD25OH 98 05/30/2022     Current Outpatient Medications on File Prior to Visit  Medication Sig   aspirin 81 MG EC tablet Take 81 mg by mouth daily.   VITAMIN D 10,000 u  Take 10,000 Units by mouth daily.   enalapril  20 MG tablet TAKE 1 TABLET DAILY F   hydrochlorothiazide 12.5 MG tablet TAKE 1 TABLET  EVERY MORNING    XALATAN 0.005 % ophth soln Place 1 drop into both eyes at bedtime.   magnesium 400 MG tablet Take  2   times daily.   meclizine 25 MG tablet Take 1/2 to 1 tablet 2 to 3 x /day as needed f   Multiple Vitamin  Take 1 tablet daily.   Omega-3 FISH OIL 1200 MG CAPS Take 1 capsule  2 times daily.   rosuvastatin  10 MG tablet TAKE 1 TABLET  DAILY FOR CHOLESTEROL   tamsulosin 0.4 MG CAPS Take  1 capsule at Bedtime      vitamin C  500 MG tablet Take 500 mg by mouth daily.     Allergies  Allergen Reactions   Viagra [Sildenafil Citrate]     Headache  PMHx:   Past Medical History:  Diagnosis Date   BPH (benign prostatic hypertrophy)    Colon polyps    Elevated hemoglobin A1c    Glaucoma    Hypertension    Vitamin D deficiency      Immunization History  Administered Date(s) Administered   Influenza, High Dose  04/01/2019   Influenza 04/30/2014, 04/12/2020   Moderna SARS-COV2 Booster Vacc 06/03/2020   Moderna Sars-Covid-2 Vacc 10/13/2019, 11/11/2019   Pneumococcal -13 06/08/2014   Pneumococcal -23 08/09/2009   Td 08/09/2009      Past Surgical History:  Procedure Laterality Date   CARDIAC CATHETERIZATION  1999   INGUINAL HERNIA REPAIR     LAPAROSCOPIC APPENDECTOMY       FHx:    Reviewed / unchanged   SHx:    Reviewed / unchanged    Systems Review:  Constitutional: Denies fever, chills, wt changes, headaches, insomnia, fatigue, night sweats, change in appetite. Eyes: Denies redness, blurred vision, diplopia, discharge, itchy, watery eyes.  ENT: Denies discharge, congestion, post nasal drip, epistaxis, sore throat, earache, hearing loss, dental pain, tinnitus, vertigo, sinus pain, snoring.  CV: Denies chest pain, palpitations, irregular heartbeat, syncope, dyspnea, diaphoresis, orthopnea, PND, claudication or edema. Respiratory: denies cough, dyspnea, DOE, pleurisy, hoarseness, laryngitis, wheezing.  Gastrointestinal: Denies dysphagia, odynophagia, heartburn, reflux, water brash, abdominal pain or cramps, nausea, vomiting, bloating, diarrhea, constipation, hematemesis,  melena, hematochezia  or hemorrhoids. Genitourinary: Denies dysuria, frequency, urgency, nocturia, hesitancy, discharge, hematuria or flank pain. Musculoskeletal: Denies arthralgias, myalgias, stiffness, jt. swelling, pain, limping or strain/sprain.  Skin: Denies pruritus, rash, hives, warts, acne, eczema or change in skin lesion(s). Neuro: No weakness, tremor, incoordination, spasms, paresthesia or pain. Psychiatric: Denies confusion, memory loss or sensory loss. Endo: Denies change in weight, skin or hair change.  Heme/Lymph: No excessive bleeding, bruising or enlarged lymph nodes.   Physical Exam  BP 118/70   Pulse 60   Temp 97.9 F (36.6 C)   Resp 17   Ht 6\' 3"  (1.905 m)   Wt 240 lb (108.9 kg)   SpO2 96%   BMI 30.00 kg/m   Postural      Sitting       BP 135/88    P 59                   &                            Standing      BP    118/70     P 77   Appears  well nourished, well groomed  and in no distress.  Eyes: PERRLA, EOMs, conjunctiva no swelling or erythema. Sinuses: No frontal/maxillary tenderness ENT/Mouth: EAC's clear, TM's nl w/o erythema, bulging. Nares clear w/o erythema, swelling, exudates. Oropharynx clear without erythema or exudates. Oral hygiene is good. Tongue normal, non obstructing. Hearing intact.  Neck: Supple. Thyroid not palpable. Car 2+/2+ without bruits, nodes or JVD. Chest: Respirations nl with BS clear & equal w/o rales, rhonchi, wheezing or stridor.  Cor: Heart sounds normal w/ regular rate and rhythm without sig. murmurs, gallops, clicks or rubs. Peripheral pulses normal and equal  without edema.  Abdomen: Soft & bowel sounds normal. Non-tender w/o guarding, rebound, hernias, masses or organomegaly.  Lymphatics: Unremarkable.  Musculoskeletal: Full ROM all peripheral extremities, joint stability, 5/5 strength and normal gait.  Skin: Warm, dry without exposed rashes, lesions or ecchymosis apparent.  Neuro: Cranial  nerves intact, reflexes equal  bilaterally. Sensory-motor testing grossly intact. Tendon reflexes grossly intact.  Pysch: Alert & oriented x 3.  Insight and judgement nl & appropriate. No ideations.   Assessment and Plan:  1. Essential hypertension  - Continue medication, monitor blood pressure at home.  - Continue DASH diet.  Reminder to go to the ER if any CP,  SOB, nausea, dizziness, severe HA, changes vision/speech.    - CBC with Differential/Platelet - COMPLETE METABOLIC PANEL WITH GFR - Magnesium - TSH  2.  Hyperlipidemia, mixed  - Continue diet/meds, exercise,& lifestyle modifications.  - Continue monitor periodic cholesterol/liver & renal functions   - Lipid panel - TSH  3. Abnormal glucose  - Continue diet, exercise  - Lifestyle modifications.  - Monitor appropriate labs    - Hemoglobin A1c - Insulin, random  4. 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 Medication management  - CBC with Differential/Platelet - COMPLETE METABOLIC PANEL WITH GFR - Magnesium - Lipid panel - TSH - Hemoglobin A1c - Insulin, random - VITAMIN D 25 Hydroxy           Discussed  regular exercise, BP monitoring, weight control to achieve/maintain BMI less than 25 and discussed med and SE's. Recommended labs to assess /monitor clinical status .  I discussed the assessment and treatment plan with the patient. The patient was provided an opportunity to ask questions and all were answered. The patient agreed with the plan and demonstrated an understanding of the instructions.  I provided over 30 minutes of exam, counseling, chart review and  complex critical decision making.         The patient was advised to call back or seek an in-person evaluation if the symptoms worsen or if the condition fails to improve as anticipated.   Marinus Maw, MD

## 2022-12-04 NOTE — Progress Notes (Incomplete)
Future Appointments  Date Time Provider Department  12/05/2022                           6 mo ov 10:30 AM Lucky Cowboy, MD [facility]  03/07/2023                           wellness 10:30 AM Raynelle Dick, NP [facility]  06/07/2023                           cpe 11:00 AM Lucky Cowboy, MD [facility]    History of Present Illness:       This very nice 82 y.o. MWM  presents for 6 month follow up with HTN, HLD, Pre-Diabetes and Vitamin D Deficiency. Patient's BPH is controlled on Tamsulosin.         Patient is treated for HTN  circa 1990 & BP has been controlled and today's  . Patient has had no complaints of any cardiac type chest pain, palpitations, dyspnea / orthopnea / PND, dizziness, claudication, or dependent edema.        Hyperlipidemia is controlled with diet & Pravastatin. Patient denies myalgias or other med SE's. Last Lipids were at goal :  Lab Results  Component Value Date   CHOL 135 09/04/2022   HDL 54 09/04/2022   LDLCALC 61 09/04/2022   TRIG 114 09/04/2022   CHOLHDL 2.5 09/04/2022     Also, the patient has history of T2_NIDDM PreDiabetes and has had no symptoms of reactive hypoglycemia, diabetic polys, paresthesias or visual blurring.  Last A1c was   Lab Results  Component Value Date   HGBA1C 5.6 05/30/2022                                                         Further, the patient also has history of Vitamin D Deficiency and supplements vitamin D . Last vitamin D was at goal :  Lab Results  Component Value Date   VD25OH 98 05/30/2022     Current Outpatient Medications on File Prior to Visit  Medication Sig  . aspirin 81 MG EC tablet Take 81 mg by mouth daily.  Marland Kitchen VITAMIN D 10,000 u  Take 10,000 Units by mouth daily.  . enalapril  20 MG tablet TAKE 1 TABLET DAILY F  . hydrochlorothiazide 12.5 MG tablet TAKE 1 TABLET  EVERY MORNING   . XALATAN 0.005 % ophth soln Place 1 drop into both eyes at bedtime.  . magnesium 400 MG tablet Take  2  times  daily.  . meclizine 25 MG tablet Take 1/2 to 1 tablet 2 to 3 x /day as needed f  . Multiple Vitamin  Take 1 tablet daily.  . Omega-3 FISH OIL 1200 MG CAPS Take 1 capsule  2 times daily.  . rosuvastatin  10 MG tablet TAKE 1 TABLET  DAILY FOR CHOLESTEROL  . tamsulosin 0.4 MG CAPS Take  1 capsule at Bedtime     . vitamin C  500 MG tablet Take 500 mg by mouth daily.     Allergies  Allergen Reactions  . Viagra [Sildenafil Citrate]     Headache     PMHx:  Past Medical History:  Diagnosis Date  . BPH (benign prostatic hypertrophy)   . Colon polyps   . Elevated hemoglobin A1c   . Glaucoma   . Hypertension   . Vitamin D deficiency      Immunization History  Administered Date(s) Administered  . Influenza, High Dose  04/01/2019  . Influenza 04/30/2014, 04/12/2020  . Moderna SARS-COV2 Booster Vacc 06/03/2020  . Moderna Sars-Covid-2 Vacc 10/13/2019, 11/11/2019  . Pneumococcal -13 06/08/2014  . Pneumococcal -23 08/09/2009  . Td 08/09/2009      Past Surgical History:  Procedure Laterality Date  . CARDIAC CATHETERIZATION  1999  . INGUINAL HERNIA REPAIR    . LAPAROSCOPIC APPENDECTOMY       FHx:    Reviewed / unchanged   SHx:    Reviewed / unchanged    Systems Review:  Constitutional: Denies fever, chills, wt changes, headaches, insomnia, fatigue, night sweats, change in appetite. Eyes: Denies redness, blurred vision, diplopia, discharge, itchy, watery eyes.  ENT: Denies discharge, congestion, post nasal drip, epistaxis, sore throat, earache, hearing loss, dental pain, tinnitus, vertigo, sinus pain, snoring.  CV: Denies chest pain, palpitations, irregular heartbeat, syncope, dyspnea, diaphoresis, orthopnea, PND, claudication or edema. Respiratory: denies cough, dyspnea, DOE, pleurisy, hoarseness, laryngitis, wheezing.  Gastrointestinal: Denies dysphagia, odynophagia, heartburn, reflux, water brash, abdominal pain or cramps, nausea, vomiting, bloating, diarrhea,  constipation, hematemesis, melena, hematochezia  or hemorrhoids. Genitourinary: Denies dysuria, frequency, urgency, nocturia, hesitancy, discharge, hematuria or flank pain. Musculoskeletal: Denies arthralgias, myalgias, stiffness, jt. swelling, pain, limping or strain/sprain.  Skin: Denies pruritus, rash, hives, warts, acne, eczema or change in skin lesion(s). Neuro: No weakness, tremor, incoordination, spasms, paresthesia or pain. Psychiatric: Denies confusion, memory loss or sensory loss. Endo: Denies change in weight, skin or hair change.  Heme/Lymph: No excessive bleeding, bruising or enlarged lymph nodes.   Physical Exam  There were no vitals taken for this visit.  Appears  well nourished, well groomed  and in no distress.  Eyes: PERRLA, EOMs, conjunctiva no swelling or erythema. Sinuses: No frontal/maxillary tenderness ENT/Mouth: EAC's clear, TM's nl w/o erythema, bulging. Nares clear w/o erythema, swelling, exudates. Oropharynx clear without erythema or exudates. Oral hygiene is good. Tongue normal, non obstructing. Hearing intact.  Neck: Supple. Thyroid not palpable. Car 2+/2+ without bruits, nodes or JVD. Chest: Respirations nl with BS clear & equal w/o rales, rhonchi, wheezing or stridor.  Cor: Heart sounds normal w/ regular rate and rhythm without sig. murmurs, gallops, clicks or rubs. Peripheral pulses normal and equal  without edema.  Abdomen: Soft & bowel sounds normal. Non-tender w/o guarding, rebound, hernias, masses or organomegaly.  Lymphatics: Unremarkable.  Musculoskeletal: Full ROM all peripheral extremities, joint stability, 5/5 strength and normal gait.  Skin: Warm, dry without exposed rashes, lesions or ecchymosis apparent.  Neuro: Cranial nerves intact, reflexes equal bilaterally. Sensory-motor testing grossly intact. Tendon reflexes grossly intact.  Pysch: Alert & oriented x 3.  Insight and judgement nl & appropriate. No ideations.   Assessment and Plan:  1.  Essential hypertension  - Continue medication, monitor blood pressure at home.  - Continue DASH diet.  Reminder to go to the ER if any CP,  SOB, nausea, dizziness, severe HA, changes vision/speech.    - CBC with Differential/Platelet - COMPLETE METABOLIC PANEL WITH GFR - Magnesium - TSH  2.  Hyperlipidemia, mixed  - Continue diet/meds, exercise,& lifestyle modifications.  - Continue monitor periodic cholesterol/liver & renal functions   - Lipid panel - TSH  3. Abnormal glucose  -  Continue diet, exercise  - Lifestyle modifications.  - Monitor appropriate labs    - Hemoglobin A1c - Insulin, random  4. 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 Medication management  - CBC with Differential/Platelet - COMPLETE METABOLIC PANEL WITH GFR - Magnesium - Lipid panel - TSH - Hemoglobin A1c - Insulin, random - VITAMIN D 25 Hydroxy           Discussed  regular exercise, BP monitoring, weight control to achieve/maintain BMI less than 25 and discussed med and SE's. Recommended labs to assess /monitor clinical status .  I discussed the assessment and treatment plan with the patient. The patient was provided an opportunity to ask questions and all were answered. The patient agreed with the plan and demonstrated an understanding of the instructions.  I provided over 30 minutes of exam, counseling, chart review and  complex critical decision making.         The patient was advised to call back or seek an in-person evaluation if the symptoms worsen or if the condition fails to improve as anticipated.   Marinus Maw, MD

## 2022-12-04 NOTE — Patient Instructions (Signed)

## 2022-12-05 ENCOUNTER — Ambulatory Visit (INDEPENDENT_AMBULATORY_CARE_PROVIDER_SITE_OTHER): Payer: Medicare Other | Admitting: Internal Medicine

## 2022-12-05 ENCOUNTER — Encounter: Payer: Self-pay | Admitting: Internal Medicine

## 2022-12-05 VITALS — BP 118/70 | HR 60 | Temp 97.9°F | Resp 17 | Ht 75.0 in | Wt 240.0 lb

## 2022-12-05 DIAGNOSIS — E559 Vitamin D deficiency, unspecified: Secondary | ICD-10-CM

## 2022-12-05 DIAGNOSIS — I10 Essential (primary) hypertension: Secondary | ICD-10-CM

## 2022-12-05 DIAGNOSIS — E782 Mixed hyperlipidemia: Secondary | ICD-10-CM

## 2022-12-05 DIAGNOSIS — N138 Other obstructive and reflux uropathy: Secondary | ICD-10-CM | POA: Diagnosis not present

## 2022-12-05 DIAGNOSIS — Z79899 Other long term (current) drug therapy: Secondary | ICD-10-CM | POA: Diagnosis not present

## 2022-12-05 DIAGNOSIS — N401 Enlarged prostate with lower urinary tract symptoms: Secondary | ICD-10-CM

## 2022-12-05 DIAGNOSIS — R7309 Other abnormal glucose: Secondary | ICD-10-CM | POA: Diagnosis not present

## 2022-12-06 ENCOUNTER — Encounter: Payer: Self-pay | Admitting: Internal Medicine

## 2022-12-06 LAB — COMPLETE METABOLIC PANEL WITH GFR
AG Ratio: 1.9 (calc) (ref 1.0–2.5)
ALT: 26 U/L (ref 9–46)
AST: 24 U/L (ref 10–35)
Albumin: 4.5 g/dL (ref 3.6–5.1)
Alkaline phosphatase (APISO): 59 U/L (ref 35–144)
BUN: 17 mg/dL (ref 7–25)
CO2: 28 mmol/L (ref 20–32)
Calcium: 9.7 mg/dL (ref 8.6–10.3)
Chloride: 104 mmol/L (ref 98–110)
Creat: 1.13 mg/dL (ref 0.70–1.22)
Globulin: 2.4 g/dL (calc) (ref 1.9–3.7)
Glucose, Bld: 98 mg/dL (ref 65–99)
Potassium: 4.4 mmol/L (ref 3.5–5.3)
Sodium: 140 mmol/L (ref 135–146)
Total Bilirubin: 1.3 mg/dL — ABNORMAL HIGH (ref 0.2–1.2)
Total Protein: 6.9 g/dL (ref 6.1–8.1)
eGFR: 66 mL/min/{1.73_m2} (ref >=60)

## 2022-12-06 LAB — CBC WITH DIFFERENTIAL/PLATELET
Absolute Monocytes: 534 cells/uL (ref 200–950)
Basophils Absolute: 29 cells/uL (ref 0–200)
Basophils Relative: 0.5 %
Eosinophils Absolute: 81 cells/uL (ref 15–500)
Eosinophils Relative: 1.4 %
HCT: 44.8 % (ref 38.5–50.0)
Hemoglobin: 15.4 g/dL (ref 13.2–17.1)
Lymphs Abs: 1821 cells/uL (ref 850–3900)
MCH: 33.7 pg — ABNORMAL HIGH (ref 27.0–33.0)
MCHC: 34.4 g/dL (ref 32.0–36.0)
MCV: 98 fL (ref 80.0–100.0)
MPV: 11 fL (ref 7.5–12.5)
Monocytes Relative: 9.2 %
Neutro Abs: 3335 cells/uL (ref 1500–7800)
Neutrophils Relative %: 57.5 %
Platelets: 145 10*3/uL (ref 140–400)
RBC: 4.57 10*6/uL (ref 4.20–5.80)
RDW: 12.5 % (ref 11.0–15.0)
Total Lymphocyte: 31.4 %
WBC: 5.8 10*3/uL (ref 3.8–10.8)

## 2022-12-06 LAB — LIPID PANEL
Cholesterol: 139 mg/dL (ref <200)
HDL: 51 mg/dL (ref >=40)
LDL Cholesterol (Calc): 66 mg/dL (calc)
Non-HDL Cholesterol (Calc): 88 mg/dL (calc) (ref <130)
Total CHOL/HDL Ratio: 2.7 (calc) (ref <5.0)
Triglycerides: 139 mg/dL (ref <150)

## 2022-12-06 LAB — MAGNESIUM: Magnesium: 2.1 mg/dL (ref 1.5–2.5)

## 2022-12-06 LAB — INSULIN, RANDOM: Insulin: 28.3 u[IU]/mL — ABNORMAL HIGH

## 2022-12-06 LAB — HEMOGLOBIN A1C
Hgb A1c MFr Bld: 5.7 % of total Hgb — ABNORMAL HIGH (ref <5.7)
Mean Plasma Glucose: 117 mg/dL
eAG (mmol/L): 6.5 mmol/L

## 2022-12-06 LAB — TSH: TSH: 2.27 mIU/L (ref 0.40–4.50)

## 2022-12-06 LAB — VITAMIN D 25 HYDROXY (VIT D DEFICIENCY, FRACTURES): Vit D, 25-Hydroxy: 80 ng/mL (ref 30–100)

## 2022-12-06 NOTE — Progress Notes (Signed)
^<^<^<^<^<^<^<^<^<^<^<^<^<^<^<^<^<^<^<^<^<^<^<^<^<^<^<^<^<^<^<^<^<^<^<^<^ ^>^>^>^>^>^>^>^>^>^>^>>^>^>^>^>^>^>^>^>^>^>^>^>^>^>^>^>^>^>^>^>^>^>^>^>^>  -  Test results slightly outside the reference range are not unusual. If there is anything important, I will review this with you,  otherwise it is considered normal test values.  If you have further questions,  please do not hesitate to contact me at the office or via My Chart.   ^<^<^<^<^<^<^<^<^<^<^<^<^<^<^<^<^<^<^<^<^<^<^<^<^<^<^<^<^<^<^<^<^<^<^<^<^ ^>^>^>^>^>^>^>^>^>^>^>^>^>^>^>^>^>^>^>^>^>^>^>^>^>^>^>^>^>^>^>^>^>^>^>^>^  -  A1c = 5.7% is borderline elevated 12 week average blood sugar,   - So recommend that you    - Avoid Sweets, Candy & White Stuff   - White Rice, White Potatoes, White Flour  - Breads &  Pasta  ^<^<^<^<^<^<^<^<^<^<^<^<^<^<^<^<^<^<^<^<^<^<^<^<^<^<^<^<^<^<^<^<^<^<^<^<^ ^>^>^>^>^>^>^>^>^>^>^>^>^>^>^>^>^>^>^>^>^>^>^>^>^>^>^>^>^>^>^>^>^>^>^>^>^  - Chol = 139  & LDL = 66  - Both  Excellent   - Very low risk for Heart Attack  / Stroke  ^<^<^<^<^<^<^<^<^<^<^<^<^<^<^<^<^<^<^<^<^<^<^<^<^<^<^<^<^<^<^<^<^<^<^<^<^ ^>^>^>^>^>^>^>^>^>^>^>^>^>^>^>^>^>^>^>^>^>^>^>^>^>^>^>^>^>^>^>^>^>^>^>^>^  - Vitamin D = 80 - Excellent  -  Please keep dosage  Same    ^<^<^<^<^<^<^<^<^<^<^<^<^<^<^<^<^<^<^<^<^<^<^<^<^<^<^<^<^<^<^<^<^<^<^<^<^ ^>^>^>^>^>^>^>^>^>^>^>^>^>^>^>^>^>^>^>^>^>^>^>^>^>^>^>^>^>^>^>^>^>^>^>^>^  - All Else - CBC - Kidneys - Electrolytes - Liver - Magnesium & Thyroid    - all  Normal / OK  ^<^<^<^<^<^<^<^<^<^<^<^<^<^<^<^<^<^<^<^<^<^<^<^<^<^<^<^<^<^<^<^<^<^<^<^<^ ^>^>^>^>^>^>^>^>^>^>^>^>^>^>^>^>^>^>^>^>^>^>^>^>^>^>^>^>^>^>^>^>^>^>^>^>^

## 2022-12-13 DIAGNOSIS — H402222 Chronic angle-closure glaucoma, left eye, moderate stage: Secondary | ICD-10-CM | POA: Diagnosis not present

## 2023-02-07 ENCOUNTER — Ambulatory Visit: Payer: Medicare Other | Admitting: Nurse Practitioner

## 2023-02-13 DIAGNOSIS — H04123 Dry eye syndrome of bilateral lacrimal glands: Secondary | ICD-10-CM | POA: Diagnosis not present

## 2023-02-13 DIAGNOSIS — H402211 Chronic angle-closure glaucoma, right eye, mild stage: Secondary | ICD-10-CM | POA: Diagnosis not present

## 2023-02-13 DIAGNOSIS — H25813 Combined forms of age-related cataract, bilateral: Secondary | ICD-10-CM | POA: Diagnosis not present

## 2023-02-13 DIAGNOSIS — H402222 Chronic angle-closure glaucoma, left eye, moderate stage: Secondary | ICD-10-CM | POA: Diagnosis not present

## 2023-02-13 DIAGNOSIS — H524 Presbyopia: Secondary | ICD-10-CM | POA: Diagnosis not present

## 2023-03-06 NOTE — Progress Notes (Unsigned)
MEDICARE ANNUAL WELLNESS VISIT AND FOLLOW UP   Assessment:   Cody Hamilton was seen today for follow-up and medicare wellness.  Diagnoses and all orders for this visit:  Medicare annual wellness visit, subsequent Yearly  Essential hypertension Continue medication: HCTZ 12.5mg  & enalapril 20mg  Monitor blood pressure at home; call if consistently over 130/80 CBC, CMP Continue DASH diet.   Reminder to go to the ER if any CP, SOB, nausea, dizziness, severe HA, changes vision/speech, left arm numbness and tingling and jaw pain.  Hyperlipidemia, mixed Continue medications: Rosuvastatin 10 mg and fish oil Continue low cholesterol diet and exerc Check lipid panel.  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 (LUTS) No change in nocturia x1 Continue tamsulosin0.4mg  nightly  Decreased hearing of both ears Discussed audiology referral Does not want hearing aids Discussed evaluation at [facility]  Obesity Long discussion about weight loss, diet, and exercise Recommended diet heavy in fruits and veggies and low in animal meats, cheeses, and dairy products, appropriate calorie intake Patient will work on increasing activity and limiting saturated fats and simple carbs Follow up at next visit Discussed dietary and exercise modifications  Medication management Continued - TSH  Glaucoma, unspecified glaucoma type, unspecified laterality Continue Xalatan 1drop both eyes Eye exam performed 01/2022    Over 30 minutes of face to face interview, exam, counseling, chart review, and critical decision making was performed  Future Appointments  Date Time Provider Department Center  03/07/2023 10:30 AM  Raynelle Dick, NP [facility] None  06/07/2023 11:00 AM Lucky Cowboy, MD [facility] None  09/10/2023  9:30 AM Raynelle Dick, NP [facility] None     Plan:   During the course of the visit the patient was educated and counseled about appropriate screening and preventive services including:   Pneumococcal vaccine  Influenza vaccine Prevnar 13 Td vaccine Screening electrocardiogram Colorectal cancer screening Diabetes screening Glaucoma screening Nutrition counseling    Subjective:  Cody Hamilton is a 82 y.o. male who presents for Medicare Annual Wellness Visit and 3 month follow up for HTN, HLD, BPH, Glaucoma, abnormal glucose, and vitamin D Def.   His blood pressure has been checked at home, today their BP is    BP Readings from Last 3 Encounters:  12/05/22 118/70  09/04/22 122/78  06/08/22 (!) 170/80  He does not workout. He denies chest pain, shortness of breath, dizziness.   He is on cholesterol medication, Rosuvastatin 10 mg daily and denies myalgias. His cholesterol is at goal. The cholesterol last visit was:   Lab Results  Component Value Date   CHOL 139 12/05/2022   HDL 51 12/05/2022   LDLCALC 66 12/05/2022   TRIG 139 12/05/2022   CHOLHDL 2.7 12/05/2022   He has been working on diet and exercise by mean of house and yard work for abnormal glucose, and denies foot ulcerations, paresthesia of the feet, polydipsia, polyuria and visual disturbances. Last A1C in the office was:  Lab Results  Component Value Date   HGBA1C 5.7 (H) 12/05/2022   Last GFR Lab Results  Component Value Date   EGFR 66 12/05/2022   BMI is There is no height or weight on file to calculate BMI., he has been working on diet and exercise. He is walking and doing  activity in the yard.  He has been eating more sweets and carbohydrates Wt Readings from Last 3 Encounters:  12/05/22 240 lb (108.9 kg)  09/04/22 239 lb 6.4 oz (108.6 kg)  06/08/22 236 lb (107 kg)    Patient is on  Vitamin D supplement.   Lab Results  Component Value Date   VD25OH 80 12/05/2022      BPH Currently he is symptomatic and denies or endorses nocturia x 1-2 a night and denies urinary hesitancy, urinary frequency, incomplete voiding, double voiding, weak stream, perineal discomfort, dysuria, hematuria, abdominal pain, flank pain and testicular pain.  He is taking Tamulosin (Flomax). He is not following with urology at this time and last results were in normal. Lab Results  Component Value Date   PSA 0.42 05/30/2022   He follows with Dr. 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 Last visit was 01/2022  Medication Review:   Current Outpatient Medications (Cardiovascular):    enalapril (VASOTEC) 20 MG tablet, TAKE 1 TABLET BY MOUTH DAILY FOR BLOOD PRESSURE   hydrochlorothiazide (HYDRODIURIL) 12.5 MG tablet, TAKE 1 TABLET BY MOUTH EVERY MORNING FOR BLOOD PRESSURE AND FLUID RETENTION OR ANKLE SWELLING   rosuvastatin (CRESTOR) 10 MG tablet, TAKE 1 TABLET BY MOUTH DAILY FOR CHOLESTEROL   Current Outpatient Medications (Analgesics):    aspirin 81 MG EC tablet, Take 81 mg by mouth daily.   Current Outpatient Medications (Other):    Cholecalciferol (VITAMIN D3) 10000 UNITS capsule, Take 10,000 Units by mouth daily.   latanoprost (XALATAN) 0.005 % ophthalmic solution, Place 1 drop into both eyes at bedtime.   magnesium oxide (MAG-OX) 400 MG tablet, Take 400 mg by mouth 2 (two) times daily.   meclizine (ANTIVERT) 25 MG tablet, Take 1/2 to 1 tablet 2 to 3 x /day as needed for Dizziness / Vertigo   Multiple Vitamin (MULTIVITAMIN) tablet, Take 1 tablet by mouth daily.   Omega-3 Fatty Acids (FISH OIL) 1200 MG CAPS, Take 1 capsule by mouth 2 (two) times daily.   tamsulosin (FLOMAX) 0.4 MG CAPS capsule, Take  1 capsule at Bedtime for Prostate                              /                                         TAKE                                BY                        MOUTH   vitamin C (ASCORBIC ACID) 500 MG tablet, Take 500 mg by mouth daily.  Allergies: Allergies  Allergen Reactions   Viagra [Sildenafil Citrate]     Headache    Current Problems (verified) has Hyperlipidemia; Hypertension; Other abnormal glucose; Vitamin D deficiency; Benign prostatic hyperplasia; Glaucoma; DDD (degenerative disc disease), lumbar; Medication management; Overweight (BMI 25.0-29.9); Depression, major, in remission (HCC); Encounter for Medicare annual wellness exam; Decreased hearing; and Morbid obesity (HCC) on their problem list.  Screening Tests Immunization History  Administered Date(s) Administered   Influenza, High Dose Seasonal PF 04/01/2019   Influenza-Unspecified 04/30/2014, 04/12/2020  Moderna SARS-COV2 Booster Vaccination 06/03/2020   Moderna Sars-Covid-2 Vaccination 10/13/2019, 11/11/2019   Pneumococcal Conjugate-13 06/08/2014   Pneumococcal Polysaccharide-23 08/09/2009   Td 08/09/2009    Preventative care: Last colonoscopy: 2019, no repeat due to age  Prior vaccinations: TD or Tdap: 2011  Influenza: 04/12/2020 Pneumococcal: 2011 Prevnar13: 2013 Shingles/Zostavax: Declined   Names of Other Physician/Practitioners you currently use: 1. [facility] here for primary care 2. Eye Exam  Dr Alben Spittle 01/2022 3. Dental Exam Dr Truddie Coco  10/22  Patient Care Team: Lucky Cowboy, MD as PCP - General (Internal Medicine) Wendall Stade, MD as Consulting Physician (Cardiology) Carman Ching, MD (Inactive) as Consulting Physician (Gastroenterology)  Surgical: He  has a past surgical history that includes Inguinal hernia repair; Laparoscopic appendectomy; and Cardiac catheterization (1999). Family His family history includes Alzheimer's disease in his mother; Cancer in his father; Hyperlipidemia in his father and sister; Hypertension in his father, mother, and sister; Lymphoma in his  father. Social history  He reports that he quit smoking about 21 years ago. His smoking use included cigarettes. He has never used smokeless tobacco. He reports that he does not drink alcohol and does not use drugs.  MEDICARE WELLNESS OBJECTIVES: Physical activity:   Cardiac risk factors:   Depression/mood screen:      12/05/2022   12:06 AM  Depression screen PHQ 2/9  Decreased Interest 0  Down, Depressed, Hopeless 0  PHQ - 2 Score 0    ADLs:     12/05/2022   12:06 AM 05/30/2022   12:00 AM  In your present state of health, do you have any difficulty performing the following activities:  Hearing? 0 0  Vision? 0 0  Difficulty concentrating or making decisions? 0 0  Walking or climbing stairs? 0 0  Dressing or bathing? 0 0  Doing errands, shopping? 0 0     Cognitive Testing  Alert? Yes  Normal Appearance?Yes  Oriented to person? Yes  Place? Yes   Time? Yes  Recall of three objects?  Yes  Can perform simple calculations? Yes  Displays appropriate judgment?Yes  Can read the correct time from a watch face?Yes  EOL planning:     Objective:   There were no vitals filed for this visit.    There is no height or weight on file to calculate BMI.  General appearance: alert, no distress, WD/WN, male HEENT: normocephalic, sclerae anicteric, TMs pearly, nares patent, no discharge or erythema, pharynx normal Oral cavity: MMM, no lesions Neck: supple, no lymphadenopathy, no thyromegaly, no masses Heart: RRR, normal S1, S2, no murmurs Lungs: CTA bilaterally, no wheezes, rhonchi, or rales Abdomen: +bs, soft, non tender, non distended, no masses, no hepatomegaly, no splenomegaly Musculoskeletal: nontender, no swelling, no obvious deformity Extremities: no edema, no cyanosis, no clubbing Pulses: 2+ symmetric, upper and lower extremities, normal cap refill Neurological: alert, oriented x 3, CN2-12 intact, strength normal upper extremities and lower extremities, sensation normal  throughout checked with monofilament, DTRs 2+ throughout, no cerebellar signs, gait normal,  Psychiatric: normal affect, behavior normal, pleasant     Medicare Attestation I have personally reviewed: The patient's medical and social history Their use of alcohol, tobacco or illicit drugs Their current medications and supplements The patient's functional ability including ADLs,fall risks, home safety risks, cognitive, and hearing and visual impairment Diet and physical activities Evidence for depression or mood disorders  The patient's weight, height, BMI, and visual acuity have been recorded in the chart.  I have  made referrals, counseling, and provided education to the patient based on review of the above and I have provided the patient with a written personalized care plan for preventive services.    Revonda Humphrey ANP-C  [facility]  03/06/2023

## 2023-03-07 ENCOUNTER — Ambulatory Visit (INDEPENDENT_AMBULATORY_CARE_PROVIDER_SITE_OTHER): Payer: Medicare Other | Admitting: Nurse Practitioner

## 2023-03-07 ENCOUNTER — Encounter: Payer: Self-pay | Admitting: Nurse Practitioner

## 2023-03-07 VITALS — BP 122/62 | HR 58 | Temp 97.3°F | Ht 73.5 in | Wt 245.0 lb

## 2023-03-07 DIAGNOSIS — N138 Other obstructive and reflux uropathy: Secondary | ICD-10-CM

## 2023-03-07 DIAGNOSIS — H409 Unspecified glaucoma: Secondary | ICD-10-CM

## 2023-03-07 DIAGNOSIS — R6889 Other general symptoms and signs: Secondary | ICD-10-CM

## 2023-03-07 DIAGNOSIS — E782 Mixed hyperlipidemia: Secondary | ICD-10-CM

## 2023-03-07 DIAGNOSIS — H9193 Unspecified hearing loss, bilateral: Secondary | ICD-10-CM | POA: Diagnosis not present

## 2023-03-07 DIAGNOSIS — E669 Obesity, unspecified: Secondary | ICD-10-CM

## 2023-03-07 DIAGNOSIS — I10 Essential (primary) hypertension: Secondary | ICD-10-CM | POA: Diagnosis not present

## 2023-03-07 DIAGNOSIS — Z0000 Encounter for general adult medical examination without abnormal findings: Secondary | ICD-10-CM

## 2023-03-07 DIAGNOSIS — N401 Enlarged prostate with lower urinary tract symptoms: Secondary | ICD-10-CM | POA: Diagnosis not present

## 2023-03-07 DIAGNOSIS — K219 Gastro-esophageal reflux disease without esophagitis: Secondary | ICD-10-CM

## 2023-03-07 DIAGNOSIS — E559 Vitamin D deficiency, unspecified: Secondary | ICD-10-CM | POA: Diagnosis not present

## 2023-03-07 DIAGNOSIS — Z79899 Other long term (current) drug therapy: Secondary | ICD-10-CM

## 2023-03-07 DIAGNOSIS — R7309 Other abnormal glucose: Secondary | ICD-10-CM

## 2023-03-07 DIAGNOSIS — Z0001 Encounter for general adult medical examination with abnormal findings: Secondary | ICD-10-CM | POA: Diagnosis not present

## 2023-03-07 LAB — CBC WITH DIFFERENTIAL/PLATELET
Absolute Monocytes: 524 cells/uL (ref 200–950)
Basophils Absolute: 40 cells/uL (ref 0–200)
Basophils Relative: 0.7 %
Eosinophils Absolute: 143 cells/uL (ref 15–500)
Eosinophils Relative: 2.5 %
HCT: 43.3 % (ref 38.5–50.0)
Hemoglobin: 14.8 g/dL (ref 13.2–17.1)
Lymphs Abs: 1881 cells/uL (ref 850–3900)
MCH: 33.2 pg — ABNORMAL HIGH (ref 27.0–33.0)
MCHC: 34.2 g/dL (ref 32.0–36.0)
MCV: 97.1 fL (ref 80.0–100.0)
MPV: 10.8 fL (ref 7.5–12.5)
Monocytes Relative: 9.2 %
Neutro Abs: 3112 cells/uL (ref 1500–7800)
Neutrophils Relative %: 54.6 %
Platelets: 151 10*3/uL (ref 140–400)
RBC: 4.46 10*6/uL (ref 4.20–5.80)
RDW: 12.4 % (ref 11.0–15.0)
Total Lymphocyte: 33 %
WBC: 5.7 10*3/uL (ref 3.8–10.8)

## 2023-03-07 NOTE — Patient Instructions (Signed)

## 2023-03-24 ENCOUNTER — Other Ambulatory Visit: Payer: Self-pay | Admitting: Internal Medicine

## 2023-03-24 DIAGNOSIS — N401 Enlarged prostate with lower urinary tract symptoms: Secondary | ICD-10-CM

## 2023-04-01 ENCOUNTER — Other Ambulatory Visit: Payer: Self-pay | Admitting: Internal Medicine

## 2023-04-01 DIAGNOSIS — I10 Essential (primary) hypertension: Secondary | ICD-10-CM

## 2023-04-02 ENCOUNTER — Other Ambulatory Visit: Payer: Self-pay | Admitting: Internal Medicine

## 2023-04-02 DIAGNOSIS — I10 Essential (primary) hypertension: Secondary | ICD-10-CM

## 2023-04-02 MED ORDER — HYDROCHLOROTHIAZIDE 12.5 MG PO TABS: ORAL_TABLET | ORAL | 3 refills | Status: DC

## 2023-06-05 ENCOUNTER — Encounter: Payer: Medicare Other | Admitting: Internal Medicine

## 2023-06-06 ENCOUNTER — Encounter: Payer: Self-pay | Admitting: Internal Medicine

## 2023-06-06 NOTE — Progress Notes (Signed)
Annual  Screening/Preventative Visit  & Comprehensive Evaluation & Examination   Future Appointments  Date Time Provider Department  06/07/2023                      cpe 11:00 AM Lucky Cowboy, MD [facility]  09/10/2023                     wellness  9:30 AM Raynelle Dick, NP [facility]  06/16/2024                     cpe 10:00 AM Lucky Cowboy, MD [facility]         This very nice 82 y.o. MWM  with  HTN, HLD, Prediabetes and Vitamin D Deficiency presents for a Screening /Preventative Visit & comprehensive evaluation and management of multiple medical co-morbidities.  Patient has hx/o Major Depression & is in remission.        HTN predates since  33. Patient's BP has been controlled at home.  Today's BP is at goal - 138/80 .   Patient denies any cardiac symptoms as chest pain, palpitations, shortness of breath, dizziness or ankle swelling.        Patient's hyperlipidemia is controlled with diet and Pravastatin. Patient denies myalgias or other medication SE's. Last lipids were at goal with sl elevated Trig's :  Lab Results  Component Value Date   CHOL 136 03/07/2023   HDL 54 03/07/2023   LDLCALC 60 03/07/2023   TRIG 137 03/07/2023   CHOLHDL 2.5 03/07/2023         Patient has hx/o prediabetes (A1c 5.7%  /2011) and patient denies reactive hypoglycemic symptoms, visual blurring, diabetic polys or paresthesias. Last A1c was at goal:   Lab Results  Component Value Date   HGBA1C 5.7 (H) 12/05/2022         Finally, patient has history of Vitamin D Deficiency ("36" /2008) and last vitamin D was at goal:   Lab Results  Component Value Date   VD25OH 15 12/05/2022       Current Outpatient Medications on File Prior to Visit  Medication Sig   aspirin 81 MG EC tablet Take daily.   VITAMIN D 91478 UNITS capsule Take daily.   enalapril 20 MG tablet Take  1 tablet  Daily     hydrochlorothiazide  12.5 MG tablet TAKE 1 TABLET EVERY MORNING    XALATAN 0.005 % ophth  soln Place 1 drop into both eyes at bedtime.   magnesium 400 MG  Take  2  times daily.   Multiple Vitamin  Take 1 tablet , daily.   Omega-3 FISH OIL 1200 MG CAPS Take 1 capsule  2 times daily.   rosuvastatin  10 MG tablet Take  1 tablet  Daily     tamsulosin 0.4 MG  TAKE 1 CAPSULE AT BEDTIME    vitamin C 500 MG tablet Take  daily.    Allergies  Allergen Reactions   Viagra [Sildenafil Citrate]     Headache     Past Medical History:  Diagnosis Date   BPH (benign prostatic hypertrophy)    Colon polyps    Elevated hemoglobin A1c    Glaucoma    Hypertension    Vitamin D deficiency      Health Maintenance  Topic Date Due   Zoster Vaccines- Shingrix (1 of 2) Never done   COVID-19 Vaccine (3 - Moderna) 12/09/2019   INFLUENZA VACCINE  02/28/2021   TETANUS/TDAP  02/14/2022 (Originally 08/10/2019)   Hepatitis C Screening  02/14/2022 (Originally 01/06/1959)   Pneumonia Vaccine  Completed   HPV VACCINES  Aged Out     Immunization History  Administered Date(s) Administered   Influenza, High Dose  04/01/2019   Influenza 04/30/2014, 04/12/2020   Moderna Sars-Covid-2 Vacc 10/13/2019, 11/11/2019   Pneumococcal - 13 06/08/2014   Pneumococcal - 23 08/09/2009   Td 08/09/2009    Last Colon - 09/07/2017 - Dr Alton Revere - No F/U due to age   Past Surgical History:  Procedure Laterality Date   CARDIAC CATHETERIZATION  1999   INGUINAL HERNIA REPAIR     LAPAROSCOPIC APPENDECTOMY       Family History  Problem Relation Age of Onset   Hypertension Mother    Alzheimer's disease Mother    Hyperlipidemia Father    Hypertension Father    Cancer Father        esophagus   Lymphoma Father    Hypertension Sister    Hyperlipidemia Sister      Social History   Tobacco Use   Smoking status: Former    Types: Cigarettes    Quit date: 07/31/2001    Years since quitting: 19.8   Smokeless tobacco: Never  Substance Use Topics   Alcohol use: No   Drug use: No       ROS Constitutional: Denies fever, chills, weight loss/gain, headaches, insomnia,  night sweats or change in appetite. Does c/o fatigue. Eyes: Denies redness, blurred vision, diplopia, discharge, itchy or watery eyes.  ENT: Denies discharge, congestion, post nasal drip, epistaxis, sore throat, earache, hearing loss, dental pain, Tinnitus, Vertigo, Sinus pain or snoring.  Cardio: Denies chest pain, palpitations, irregular heartbeat, syncope, dyspnea, diaphoresis, orthopnea, PND, claudication or edema Respiratory: denies cough, dyspnea, DOE, pleurisy, hoarseness, laryngitis or wheezing.  Gastrointestinal: Denies dysphagia, heartburn, reflux, water brash, pain, cramps, nausea, vomiting, bloating, diarrhea, constipation, hematemesis, melena, hematochezia, jaundice or hemorrhoids Genitourinary: Denies dysuria, frequency, discharge, hematuria or flank pain. Has urgency, nocturia x 2-3 & occasional hesitancy. Musculoskeletal: Denies arthralgia, myalgia, stiffness, Jt. Swelling, pain, limp or strain/sprain. Denies Falls. Skin: Denies puritis, rash, hives, warts, acne, eczema or change in skin lesion Neuro: No weakness, tremor, incoordination, spasms, paresthesia or pain Psychiatric: Denies confusion, memory loss or sensory loss. Denies Depression. Endocrine: Denies change in weight, skin, hair change, nocturia, and paresthesia, diabetic polys, visual blurring or hyper / hypo glycemic episodes.  Heme/Lymph: No excessive bleeding, bruising or enlarged lymph nodes.   Physical Exam  BP 138/80   Pulse 65   Temp 97.9 F (36.6 C)   Resp 16   Ht 6' 1.5" (1.867 m)   Wt 242 lb 3.2 oz (109.9 kg)   SpO2 96%   BMI 31.52 kg/m   General Appearance: Well nourished and well groomed and in no apparent distress.  Eyes: PERRLA, EOMs, conjunctiva no swelling or erythema, normal fundi and vessels. Sinuses: No frontal/maxillary tenderness ENT/Mouth: EACs patent / TMs  nl. Nares clear without erythema, swelling,  mucoid exudates. Oral hygiene is good. No erythema, swelling, or exudate. Tongue normal, non-obstructing. Tonsils not swollen or erythematous. Hearing normal.  Neck: Supple, thyroid not palpable. No bruits, nodes or JVD. Respiratory: Respiratory effort normal.  BS equal and clear bilateral without rales, rhonci, wheezing or stridor. Cardio: Heart sounds are normal with regular rate and rhythm and no murmurs, rubs or gallops. Peripheral pulses are normal and equal bilaterally without edema. No aortic or femoral bruits. Chest:  symmetric with normal excursions and percussion.  Abdomen: Soft, with Nl bowel sounds. Nontender, no guarding, rebound, hernias, masses, or organomegaly.  Lymphatics: Non tender without lymphadenopathy.  Musculoskeletal: Full ROM all peripheral extremities, joint stability, 5/5 strength, and normal gait. Skin: Warm and dry without rashes, lesions, cyanosis, clubbing or  ecchymosis.  Neuro: Cranial nerves intact, reflexes equal bilaterally. Normal muscle tone, no cerebellar symptoms. Sensation intact.  Pysch: Alert and oriented X 3 with normal affect, insight and judgment appropriate.   Assessment and Plan   1. Annual Preventative/Screening Exam    2. Essential hypertension   - EKG 12-Lead - [place], RETROPERITNL ABD,  LTD - Urinalysis, Routine w reflex microscopic - Microalbumin / creatinine urine ratio - CBC with Differential/Platelet - COMPLETE METABOLIC PANEL WITH GFR - Magnesium - TSH   3. Hyperlipidemia, mixed  - EKG 12-Lead - [place], RETROPERITNL ABD,  LTD - Lipid panel - TSH   4. Abnormal glucose  - EKG 12-Lead - [place], RETROPERITNL ABD,  LTD - Hemoglobin A1c - Insulin, random   5. Vitamin D deficiency  - VITAMIN D 25 Hydroxy    6. BPH with obstruction/lower urinary tract symptoms  - PSA   7. Depression, major, in remission (HCC)   8. Screening for colorectal cancer  - POC Hemoccult Bld/Stl    9. Prostate cancer screening  -  PSA   10. Screening for heart disease  - EKG 12-Lead   11. FH: hypertension  - EKG 12-Lead - [place], RETROPERITNL ABD,  LTD   12. Former smoker  - EKG 12-Lead - [place], RETROPERITNL ABD,  LTD   13. Screening for AAA (aortic abdominal aneurysm)  - [place], RETROPERITNL ABD,  LTD   14. Medication management  - Urinalysis, Routine w reflex microscopic - Microalbumin / creatinine urine ratio - CBC with Differential/Platelet - COMPLETE METABOLIC PANEL WITH GFR - Magnesium - Lipid panel - TSH - Hemoglobin A1c - Insulin, random - VITAMIN D 25 Hydroxy           Patient was counseled in prudent diet, weight control to achieve/maintain BMI less than 25, BP monitoring, regular exercise and medications as discussed.  Discussed med effects and SE's. Routine screening labs and tests as requested with regular follow-up as recommended. Over 40 minutes of exam, counseling, chart review and high complex critical decision making was performed   Cody Maw, MD.

## 2023-06-06 NOTE — Patient Instructions (Signed)

## 2023-06-07 ENCOUNTER — Ambulatory Visit (INDEPENDENT_AMBULATORY_CARE_PROVIDER_SITE_OTHER): Payer: Medicare Other | Admitting: Internal Medicine

## 2023-06-07 ENCOUNTER — Encounter: Payer: Self-pay | Admitting: Internal Medicine

## 2023-06-07 VITALS — BP 138/80 | HR 65 | Temp 97.9°F | Resp 16 | Ht 73.5 in | Wt 242.2 lb

## 2023-06-07 DIAGNOSIS — Z0000 Encounter for general adult medical examination without abnormal findings: Secondary | ICD-10-CM | POA: Diagnosis not present

## 2023-06-07 DIAGNOSIS — Z136 Encounter for screening for cardiovascular disorders: Secondary | ICD-10-CM | POA: Diagnosis not present

## 2023-06-07 DIAGNOSIS — Z79899 Other long term (current) drug therapy: Secondary | ICD-10-CM | POA: Diagnosis not present

## 2023-06-07 DIAGNOSIS — Z87891 Personal history of nicotine dependence: Secondary | ICD-10-CM

## 2023-06-07 DIAGNOSIS — I10 Essential (primary) hypertension: Secondary | ICD-10-CM

## 2023-06-07 DIAGNOSIS — E559 Vitamin D deficiency, unspecified: Secondary | ICD-10-CM | POA: Diagnosis not present

## 2023-06-07 DIAGNOSIS — R7309 Other abnormal glucose: Secondary | ICD-10-CM

## 2023-06-07 DIAGNOSIS — Z8249 Family history of ischemic heart disease and other diseases of the circulatory system: Secondary | ICD-10-CM

## 2023-06-07 DIAGNOSIS — I700 Atherosclerosis of aorta: Secondary | ICD-10-CM | POA: Diagnosis not present

## 2023-06-07 DIAGNOSIS — E782 Mixed hyperlipidemia: Secondary | ICD-10-CM

## 2023-06-07 DIAGNOSIS — Z1211 Encounter for screening for malignant neoplasm of colon: Secondary | ICD-10-CM

## 2023-06-07 DIAGNOSIS — F325 Major depressive disorder, single episode, in full remission: Secondary | ICD-10-CM

## 2023-06-07 DIAGNOSIS — Z125 Encounter for screening for malignant neoplasm of prostate: Secondary | ICD-10-CM

## 2023-06-07 DIAGNOSIS — N138 Other obstructive and reflux uropathy: Secondary | ICD-10-CM | POA: Diagnosis not present

## 2023-06-07 DIAGNOSIS — Z0001 Encounter for general adult medical examination with abnormal findings: Secondary | ICD-10-CM

## 2023-06-08 LAB — CBC WITH DIFFERENTIAL/PLATELET
Absolute Lymphocytes: 2042 {cells}/uL (ref 850–3900)
Absolute Monocytes: 506 {cells}/uL (ref 200–950)
Basophils Absolute: 32 {cells}/uL (ref 0–200)
Basophils Relative: 0.5 %
Eosinophils Absolute: 90 {cells}/uL (ref 15–500)
Eosinophils Relative: 1.4 %
HCT: 47 % (ref 38.5–50.0)
Hemoglobin: 15.8 g/dL (ref 13.2–17.1)
MCH: 33.5 pg — ABNORMAL HIGH (ref 27.0–33.0)
MCHC: 33.6 g/dL (ref 32.0–36.0)
MCV: 99.8 fL (ref 80.0–100.0)
MPV: 10.8 fL (ref 7.5–12.5)
Monocytes Relative: 7.9 %
Neutro Abs: 3731 {cells}/uL (ref 1500–7800)
Neutrophils Relative %: 58.3 %
Platelets: 154 10*3/uL (ref 140–400)
RBC: 4.71 10*6/uL (ref 4.20–5.80)
RDW: 12.1 % (ref 11.0–15.0)
Total Lymphocyte: 31.9 %
WBC: 6.4 10*3/uL (ref 3.8–10.8)

## 2023-06-08 LAB — COMPLETE METABOLIC PANEL WITH GFR
AG Ratio: 1.8 (calc) (ref 1.0–2.5)
ALT: 26 U/L (ref 9–46)
AST: 23 U/L (ref 10–35)
Albumin: 4.6 g/dL (ref 3.6–5.1)
Alkaline phosphatase (APISO): 62 U/L (ref 35–144)
BUN: 20 mg/dL (ref 7–25)
CO2: 31 mmol/L (ref 20–32)
Calcium: 9.8 mg/dL (ref 8.6–10.3)
Chloride: 100 mmol/L (ref 98–110)
Creat: 1.08 mg/dL (ref 0.70–1.22)
Globulin: 2.6 g/dL (ref 1.9–3.7)
Glucose, Bld: 131 mg/dL — ABNORMAL HIGH (ref 65–99)
Potassium: 4.1 mmol/L (ref 3.5–5.3)
Sodium: 141 mmol/L (ref 135–146)
Total Bilirubin: 1.1 mg/dL (ref 0.2–1.2)
Total Protein: 7.2 g/dL (ref 6.1–8.1)
eGFR: 69 mL/min/{1.73_m2} (ref >=60)

## 2023-06-08 LAB — URINALYSIS, ROUTINE W REFLEX MICROSCOPIC
Bilirubin Urine: NEGATIVE
Glucose, UA: NEGATIVE
Hgb urine dipstick: NEGATIVE
Ketones, ur: NEGATIVE
Leukocytes,Ua: NEGATIVE
Nitrite: NEGATIVE
Protein, ur: NEGATIVE
Specific Gravity, Urine: 1.022 (ref 1.001–1.035)
pH: 5.5 (ref 5.0–8.0)

## 2023-06-08 LAB — LIPID PANEL
Cholesterol: 145 mg/dL (ref <200)
HDL: 57 mg/dL (ref >=40)
LDL Cholesterol (Calc): 60 mg/dL
Non-HDL Cholesterol (Calc): 88 mg/dL (ref <130)
Total CHOL/HDL Ratio: 2.5 (calc) (ref <5.0)
Triglycerides: 225 mg/dL — ABNORMAL HIGH (ref <150)

## 2023-06-08 LAB — MAGNESIUM: Magnesium: 2.1 mg/dL (ref 1.5–2.5)

## 2023-06-08 LAB — PSA: PSA: 0.49 ng/mL (ref <=4.00)

## 2023-06-08 LAB — MICROALBUMIN / CREATININE URINE RATIO
Creatinine, Urine: 150 mg/dL (ref 20–320)
Microalb Creat Ratio: 4 mg/g{creat} (ref <30)
Microalb, Ur: 0.6 mg/dL

## 2023-06-08 LAB — INSULIN, RANDOM: Insulin: 130.6 u[IU]/mL — ABNORMAL HIGH

## 2023-06-08 LAB — TSH: TSH: 1.62 m[IU]/L (ref 0.40–4.50)

## 2023-06-08 LAB — HEMOGLOBIN A1C
Hgb A1c MFr Bld: 5.7 %{Hb} — ABNORMAL HIGH (ref <5.7)
Mean Plasma Glucose: 117 mg/dL
eAG (mmol/L): 6.5 mmol/L

## 2023-06-08 LAB — VITAMIN D 25 HYDROXY (VIT D DEFICIENCY, FRACTURES): Vit D, 25-Hydroxy: 83 ng/mL (ref 30–100)

## 2023-06-08 NOTE — Progress Notes (Signed)
<>*<>*<>*<>*<>*<>*<>*<>*<>*<>*<>*<>*<>*<>*<>*<>*<>*<>*<>*<>*<>*<>*<>*<>*<> <>*<>*<>*<>*<>*<>*<>*<>*<>*<>*<>*<>*<>*<>*<>*<>*<>*<>*<>*<>*<>*<>*<>*<>*<>  -Test results slightly outside the reference range are not unusual. If there is anything important, I will review this with you,  otherwise it is considered normal test values.  If you have further questions,  please do not hesitate to contact me at the office or via My Chart.   <>*<>*<>*<>*<>*<>*<>*<>*<>*<>*<>*<>*<>*<>*<>*<>*<>*<>*<>*<>*<>*<>*<>*<>*<> <>*<>*<>*<>*<>*<>*<>*<>*<>*<>*<>*<>*<>*<>*<>*<>*<>*<>*<>*<>*<>*<>*<>*<>*<>   -  Total  Chol =   145   - Excellent             (  Ideal  or  Goal is less than 180  !  )  & -  Bad / Dangerous LDL  Chol =   60 - also Excellent              (  Ideal  or  Goal is less than 70  !  )    Excellent   - Very low risk for Heart Attack  / Stroke  - But . . . . . .  Triglycerides ( = 225 ) or fats in blood are too high                 (   Ideal or  Goal is less than 150  !  )    - Recommend avoid fried & greasy foods,  sweets / candy,   - Avoid white rice  (brown or wild rice or Quinoa is OK),   - Avoid white potatoes  (sweet potatoes are OK)   - Avoid anything made from white flour  - bagels, doughnuts, rolls, buns, biscuits, white and   wheat breads, pizza crust and traditional  pasta made of white flour & egg white  - (vegetarian pasta or spinach or wheat pasta is OK).    - Multi-grain bread is OK - like multi-grain flat bread or  sandwich thins.   - Avoid alcohol in excess.   - Exercise is also important.  <>*<>*<>*<>*<>*<>*<>*<>*<>*<>*<>*<>*<>*<>*<>*<>*<>*<>*<>*<>*<>*<>*<>*<>*<> <>*<>*<>*<>*<>*<>*<>*<>*<>*<>*<>*<>*<>*<>*<>*<>*<>*<>*<>*<>*<>*<>*<>*<>*<>  -  A1c = 5.7%  borderline elevated  blood sugar in the early or pre-diabetes range which has the same   300% increased risk for heart attack, stroke, cancer and   alzheimer- type vascular dementia as full blown diabetes.    But the good news is that diet, exercise with  weight loss can cure the early diabetes at this point.  (( Losing weight will cure  ))  So   - Avoid Sweets, Candy & White Stuff   - White Rice, White Potatoes, White Flour  - Breads &  Pasta  <>*<>*<>*<>*<>*<>*<>*<>*<>*<>*<>*<>*<>*<>*<>*<>*<>*<>*<>*<>*<>*<>*<>*<>*<> <>*<>*<>*<>*<>*<>*<>*<>*<>*<>*<>*<>*<>*<>*<>*<>*<>*<>*<>*<>*<>*<>*<>*<>*<>  -  Insulin = 130.6 is very very high ( normal is less than 20 )    shows insulin resistance - a sign of early diabetes and   associated with a 300 % greater risk for   heart attacks, strokes, cancer & Alzheimer type vascular dementia   - All this can be cured  and prevented with losing weight   (( Losing weight will cure  ))  - get Dr Francis Dowse Fuhrman's book 'the End of Diabetes" and   "the End of Dieting"  and add many years of good health to your life.  <>*<>*<>*<>*<>*<>*<>*<>*<>*<>*<>*<>*<>*<>*<>*<>*<>*<>*<>*<>*<>*<>*<>*<>*<> <>*<>*<>*<>*<>*<>*<>*<>*<>*<>*<>*<>*<>*<>*<>*<>*<>*<>*<>*<>*<>*<>*<>*<>*<>  -  Vitamin D = 83   -   Excellent -  please keep dosage  same   <>*<>*<>*<>*<>*<>*<>*<>*<>*<>*<>*<>*<>*<>*<>*<>*<>*<>*<>*<>*<>*<>*<>*<>*<> <>*<>*<>*<>*<>*<>*<>*<>*<>*<>*<>*<>*<>*<>*<>*<>*<>*<>*<>*<>*<>*<>*<>*<>*<>  -  All Else - CBC - Kidneys - Electrolytes - Liver - Magnesium & Thyroid    - all  Normal / OK  <>*<>*<>*<>*<>*<>*<>*<>*<>*<>*<>*<>*<>*<>*<>*<>*<>*<>*<>*<>*<>*<>*<>*<>*<> <>*<>*<>*<>*<>*<>*<>*<>*<>*<>*<>*<>*<>*<>*<>*<>*<>*<>*<>*<>*<>*<>*<>*<>*<>

## 2023-06-10 ENCOUNTER — Encounter: Payer: Self-pay | Admitting: Internal Medicine

## 2023-08-22 DIAGNOSIS — H402222 Chronic angle-closure glaucoma, left eye, moderate stage: Secondary | ICD-10-CM | POA: Diagnosis not present

## 2023-08-22 DIAGNOSIS — H25813 Combined forms of age-related cataract, bilateral: Secondary | ICD-10-CM | POA: Diagnosis not present

## 2023-08-22 DIAGNOSIS — H402211 Chronic angle-closure glaucoma, right eye, mild stage: Secondary | ICD-10-CM | POA: Diagnosis not present

## 2023-09-10 ENCOUNTER — Ambulatory Visit: Payer: Medicare Other | Admitting: Nurse Practitioner

## 2023-09-14 ENCOUNTER — Ambulatory Visit: Payer: Medicare Other | Admitting: Nurse Practitioner

## 2023-09-27 ENCOUNTER — Other Ambulatory Visit: Payer: Self-pay

## 2023-09-27 DIAGNOSIS — E782 Mixed hyperlipidemia: Secondary | ICD-10-CM

## 2023-09-27 MED ORDER — ROSUVASTATIN CALCIUM 10 MG PO TABS: ORAL_TABLET | ORAL | 0 refills | Status: AC

## 2023-12-26 ENCOUNTER — Ambulatory Visit: Payer: Medicare Other | Admitting: Internal Medicine

## 2024-01-03 DIAGNOSIS — H25812 Combined forms of age-related cataract, left eye: Secondary | ICD-10-CM | POA: Diagnosis not present

## 2024-01-22 DIAGNOSIS — H5703 Miosis: Secondary | ICD-10-CM | POA: Diagnosis not present

## 2024-01-22 DIAGNOSIS — H2512 Age-related nuclear cataract, left eye: Secondary | ICD-10-CM | POA: Diagnosis not present

## 2024-01-22 DIAGNOSIS — H268 Other specified cataract: Secondary | ICD-10-CM | POA: Diagnosis not present

## 2024-01-22 DIAGNOSIS — H25812 Combined forms of age-related cataract, left eye: Secondary | ICD-10-CM | POA: Diagnosis not present

## 2024-03-25 NOTE — Progress Notes (Unsigned)
 Cardiology Office Note:    Date:  03/27/2024   ID:  Cody Hamilton, DOB October 16, 1940, MRN [id number]  PCP:  [name], MD  Cardiologist:  None  Electrophysiologist:  None   Referring MD: Tonita Fallow, MD   Chief Complaint  Patient presents with   Dizziness    History of Present Illness:    Cody Hamilton is a 83 y.o. male with a hx of hypertension, hyperlipidemia, morbid obesity who is referred by Dr. Kip for evaluation of lightheadedness.  He reports lightheadedness initially occurred when he was waking up in the morning and getting out of bed, states that it felt like vertigo.  He was started on meclizine  but did not report improvement.  States that lightheadedness happens with standing.  Denies any syncope, but does report has had near syncopal episode.  He denies any chest pain, lower extremity edema.  States that he feels palpitations intermittently, last for short duration.  He smoked for over 40 years, up to nearly 2 packs/day, quit in 2004.  Family history includes father had CVA in 11s and his mother had CVA in 59s.    Past Medical History:  Diagnosis Date   Benign prostatic hyperplasia    BPH (benign prostatic hypertrophy)    Colon polyps    DDD (degenerative disc disease), lumbar    Decreased hearing 02/18/2016   Elevated hemoglobin A1c    Glaucoma    Hyperlipidemia 01/10/2011   Hypertension    Morbid obesity (HCC) 05/29/2022   Other abnormal glucose    Overweight (BMI 25.0-29.9) 06/08/2014   Vitamin D  deficiency     Past Surgical History:  Procedure Laterality Date   CARDIAC CATHETERIZATION  1999   INGUINAL HERNIA REPAIR     LAPAROSCOPIC APPENDECTOMY      Current Medications: Current Meds  Medication Sig   aspirin 81 MG EC tablet Take 81 mg by mouth daily.   Cholecalciferol (VITAMIN D3) 10000 UNITS capsule Take 10,000 Units by mouth daily.   enalapril (VASOTEC) 20 MG tablet TAKE 1 TABLET BY MOUTH DAILY FOR BLOOD PRESSURE (Patient  taking differently: Take 10 mg by mouth daily. TAKE 1 TABLET BY MOUTH DAILY FOR BLOOD PRESSURE)   latanoprost (XALATAN) 0.005 % ophthalmic solution Place 1 drop into both eyes at bedtime.   magnesium oxide (MAG-OX) 400 MG tablet Take 400 mg by mouth 2 (two) times daily.   meclizine  (ANTIVERT ) 25 MG tablet Take 1/2 to 1 tablet 2 to 3 x /day as needed for Dizziness / Vertigo   Multiple Vitamin (MULTIVITAMIN) tablet Take 1 tablet by mouth daily.   Omega-3 Fatty Acids (FISH OIL) 1200 MG CAPS Take 1 capsule by mouth 2 (two) times daily.   rosuvastatin  (CRESTOR ) 10 MG tablet TAKE 1 TABLET BY MOUTH DAILY FOR CHOLESTEROL   tamsulosin  (FLOMAX ) 0.4 MG CAPS capsule Take  1 capsule at Bedtime for Prostate                                                                         /  TAKE                                         BY                                                 MOUTH   vitamin C (ASCORBIC ACID) 500 MG tablet Take 500 mg by mouth daily.   [DISCONTINUED] hydrochlorothiazide  (HYDRODIURIL ) 12.5 MG tablet Take  1 tablet  Daily for BP & Fluid Retention / Ankle Swelling                              /                                                                   TAKE                                         BY                                                 MOUTH     Allergies:   Horse-derived products and Viagra [sildenafil citrate]   Social History   Socioeconomic History   Marital status: Married    Spouse name: Not on file   Number of children: Not on file   Years of education: Not on file   Highest education level: Not on file  Occupational History   Not on file  Tobacco Use   Smoking status: Former    Current packs/day: 0.00    Types: Cigarettes    Quit date: 07/31/2001    Years since quitting: 22.6   Smokeless tobacco: Never  Substance and Sexual Activity   Alcohol use: No   Drug use: No   Sexual activity: Not on file   Other Topics Concern   Not on file  Social History Narrative   NO FAMILY HISTORY IN [place]   Social Drivers of Health   Financial Resource Strain: Not on file  Food Insecurity: Not on file  Transportation Needs: Not on file  Physical Activity: Not on file  Stress: Not on file  Social Connections: Not on file     Family History: The patient's family history includes Alzheimer's disease in his mother; Cancer in his father; Hyperlipidemia in his father and sister; Hypertension in his father, mother, and sister; Lymphoma in his father.  ROS:   Please see the history of present illness.     All other systems reviewed and are negative.  EKGs/Labs/Other Studies Reviewed:    The following studies were reviewed today:   EKG:   03/27/2024: Sinus bradycardia, rate 58, no ST abnormalities  Recent Labs: 06/07/2023: ALT 26; BUN 20; Creat 1.08; Hemoglobin  15.8; Magnesium 2.1; Platelets 154; Potassium 4.1; Sodium 141; TSH 1.62  Recent Lipid Panel    Component Value Date/Time   CHOL 145 06/07/2023 1100   TRIG 225 (H) 06/07/2023 1100   HDL 57 06/07/2023 1100   CHOLHDL 2.5 06/07/2023 1100   VLDL 22 12/26/2016 1134   LDLCALC 60 06/07/2023 1100    Physical Exam:    VS:  Pulse (!) 58   Ht 6' 3 (1.905 m)   Wt 248 lb 3.2 oz (112.6 kg)   SpO2 96%   BMI 31.02 kg/m     Wt Readings from Last 3 Encounters:  03/27/24 248 lb 3.2 oz (112.6 kg)  06/07/23 242 lb 3.2 oz (109.9 kg)  03/07/23 245 lb (111.1 kg)     GEN:  Well nourished, well developed in no acute distress HEENT: Normal NECK: No JVD; No carotid bruits LYMPHATICS: No lymphadenopathy CARDIAC: RRR, no murmurs, rubs, gallops RESPIRATORY:  Clear to auscultation without rales, wheezing or rhonchi  ABDOMEN: Soft, non-tender, non-distended MUSCULOSKELETAL:  No edema; No deformity  SKIN: Warm and dry NEUROLOGIC:  Alert and oriented x 3 PSYCHIATRIC:  Normal affect   ASSESSMENT:    1. Lightheadedness   2. Near syncope   3.  Hypertension, unspecified type   4. Hyperlipidemia, unspecified hyperlipidemia type   5. Medication management    PLAN:    Lightheadedness/near syncope: Description suggest orthostatic hypotension.  Orthostatics in clinic today did show about 20 point drop in SBP with standing.  Will discontinue HCTZ.  Check echocardiogram to rule out any structural heart disease.  Plan Zio patch x 2 weeks to rule out arrhythmia  Hypertension: On HCTZ 12.5 mg daily and enalapril 10 mg daily.  Plan to stop HCTZ as above.  Check BMET, magnesium.  Asked to check BP daily for next 2 weeks and let us  know results  Hyperlipidemia: On rosuvastatin  10 mg daily.  Check lipid panel  RTC in 3 months  Medication Adjustments/Labs and Tests Ordered: Current medicines are reviewed at length with the patient today.  Concerns regarding medicines are outlined above.  Orders Placed This Encounter  Procedures   Basic Metabolic Panel (BMET)   Magnesium   Lipid Profile   LONG TERM MONITOR (3-14 DAYS)   EKG 12-Lead   ECHOCARDIOGRAM COMPLETE   No orders of the defined types were placed in this encounter.   Patient Instructions  Medication Instructions:  Your physician has recommended you make the following change in your medication:  STOP Hydrochlorothiazide   *If you need a refill on your cardiac medications before your next appointment, please call your pharmacy*  Lab Work: Today: BMET, Magnesium level & lipid panel  If you have any lab test that is abnormal or we need to change your treatment, we will call you to review the results.  Testing/Procedures: Your physician has requested that you have an echocardiogram. Echocardiography is a painless test that uses sound waves to create images of your heart. It provides your doctor with information about the size and shape of your heart and how well your heart's chambers and valves are working. This procedure takes approximately one hour. There are no restrictions for  this procedure. Please do NOT wear cologne, perfume, aftershave, or lotions (deodorant is allowed). Please arrive 15 minutes prior to your appointment time.  Please note: We ask at that you not bring children with you during ultrasound (echo/ vascular) testing. Due to room size and safety concerns, children are not allowed in the ultrasound rooms  during exams. Our front office staff cannot provide observation of children in our lobby area while testing is being conducted. An adult accompanying a patient to their appointment will only be allowed in the ultrasound room at the discretion of the ultrasound technician under special circumstances. We apologize for any inconvenience.                             ZIO XT- Long Term Monitor Instructions  Your physician has requested you wear a ZIO patch monitor for 14 days.  This is a single patch monitor. [employer] supplies one patch monitor per enrollment. Additional stickers are not available. Please do not apply patch if you will be having a Nuclear Stress Test,  Echocardiogram, Cardiac CT, MRI, or Chest Xray during the period you would be wearing the  monitor. The patch cannot be worn during these tests. You cannot remove and re-apply the  ZIO XT patch monitor.  Your ZIO patch monitor will be mailed 3 day USPS to your address on file. It may take 3-5 days  to receive your monitor after you have been enrolled.  Once you have received your monitor, please review the enclosed instructions. Your monitor  has already been registered assigning a specific monitor serial # to you.  Billing and Patient Assistance Program Information  We have supplied [employer] with any of your insurance information on file for billing purposes. [employer] offers a sliding scale Patient Assistance Program for patients that do not have  insurance, or whose insurance does not completely cover the cost of the ZIO monitor.  You must apply for the Patient Assistance Program to  qualify for this discounted rate.  To apply, please call [employer] at [phone], select option 4, select option 2, ask to apply for  Patient Assistance Program. Meredeth will ask your household income, and how many people  are in your household. They will quote your out-of-pocket cost based on that information.  [employer] will also be able to set up a 73-month, interest-free payment plan if needed.  Applying the monitor   Shave hair from upper left chest.  Hold abrader disc by orange tab. Rub abrader in 40 strokes over the upper left chest as  indicated in your monitor instructions.  Clean area with 4 enclosed alcohol pads. Let dry.  Apply patch as indicated in monitor instructions. Patch will be placed under collarbone on left  side of chest with arrow pointing upward.  Rub patch adhesive wings for 2 minutes. Remove white label marked 1. Remove the white  label marked 2. Rub patch adhesive wings for 2 additional minutes.  While looking in a mirror, press and release button in center of patch. A small green light will  flash 3-4 times. This will be your only indicator that the monitor has been turned on.  Do not shower for the first 24 hours. You may shower after the first 24 hours.  Press the button if you feel a symptom. You will hear a small click. Record Date, Time and  Symptom in the Patient Logbook.  When you are ready to remove the patch, follow instructions on the last 2 pages of Patient  Logbook. Stick patch monitor onto the last page of Patient Logbook.  Place Patient Logbook in the blue and white box. Use locking tab on box and tape box closed  securely. The blue and white box has prepaid postage on it. Please place it in the  mailbox as  soon as possible. Your physician should have your test results approximately 7 days after the  monitor has been mailed back to [facility].  Call [facility] Customer Care at [phone] if you have questions regarding  your ZIO XT  patch monitor. Call them immediately if you see an orange light blinking on your  monitor.  If your monitor falls off in less than 4 days, contact our Monitor department at [phone].  If your monitor becomes loose or falls off after 4 days call [employer] at [phone] for  suggestions on securing your monitor    Follow-Up: At [facility], you and your health needs are our priority.  As part of our continuing mission to provide you with exceptional heart care, our providers are all part of one team.  This team includes your primary Cardiologist (physician) and Advanced Practice Providers or APPs (Physician Assistants and Nurse Practitioners) who all work together to provide you with the care you need, when you need it.  Your next appointment:   3 month(s)  Provider:   Dr. Kate     Thank you for choosing [facility]!!   [phone]   Check your blood pressure twice a day for the next week and let the office know your results.      Signed, Lonni LITTIE Kate, MD  03/27/2024 6:04 PM    [facility]

## 2024-03-27 ENCOUNTER — Ambulatory Visit

## 2024-03-27 ENCOUNTER — Encounter: Payer: Self-pay | Admitting: Cardiology

## 2024-03-27 ENCOUNTER — Ambulatory Visit: Attending: Cardiology | Admitting: Cardiology

## 2024-03-27 VITALS — HR 58 | Ht 75.0 in | Wt 248.2 lb

## 2024-03-27 DIAGNOSIS — E785 Hyperlipidemia, unspecified: Secondary | ICD-10-CM

## 2024-03-27 DIAGNOSIS — R42 Dizziness and giddiness: Secondary | ICD-10-CM

## 2024-03-27 DIAGNOSIS — R55 Syncope and collapse: Secondary | ICD-10-CM

## 2024-03-27 DIAGNOSIS — Z79899 Other long term (current) drug therapy: Secondary | ICD-10-CM

## 2024-03-27 DIAGNOSIS — I10 Essential (primary) hypertension: Secondary | ICD-10-CM

## 2024-03-27 NOTE — Progress Notes (Unsigned)
 Enrolled for [employer] to mail a ZIO XT long term holter monitor to the patients address on file.  ? ?Dr. Bjorn Pippin to read. ?

## 2024-03-27 NOTE — Patient Instructions (Addendum)
 Medication Instructions:  Your physician has recommended you make the following change in your medication:  STOP Hydrochlorothiazide   *If you need a refill on your cardiac medications before your next appointment, please call your pharmacy*  Lab Work: Today: BMET, Magnesium level & lipid panel  If you have any lab test that is abnormal or we need to change your treatment, we will call you to review the results.  Testing/Procedures: Your physician has requested that you have an echocardiogram. Echocardiography is a painless test that uses sound waves to create images of your heart. It provides your doctor with information about the size and shape of your heart and how well your heart's chambers and valves are working. This procedure takes approximately one hour. There are no restrictions for this procedure. Please do NOT wear cologne, perfume, aftershave, or lotions (deodorant is allowed). Please arrive 15 minutes prior to your appointment time.  Please note: We ask at that you not bring children with you during ultrasound (echo/ vascular) testing. Due to room size and safety concerns, children are not allowed in the ultrasound rooms during exams. Our front office staff cannot provide observation of children in our lobby area while testing is being conducted. An adult accompanying a patient to their appointment will only be allowed in the ultrasound room at the discretion of the ultrasound technician under special circumstances. We apologize for any inconvenience.                             ZIO XT- Long Term Monitor Instructions  Your physician has requested you wear a ZIO patch monitor for 14 days.  This is a single patch monitor. [employer] supplies one patch monitor per enrollment. Additional stickers are not available. Please do not apply patch if you will be having a Nuclear Stress Test,  Echocardiogram, Cardiac CT, MRI, or Chest Xray during the period you would be wearing the  monitor.  The patch cannot be worn during these tests. You cannot remove and re-apply the  ZIO XT patch monitor.  Your ZIO patch monitor will be mailed 3 day USPS to your address on file. It may take 3-5 days  to receive your monitor after you have been enrolled.  Once you have received your monitor, please review the enclosed instructions. Your monitor  has already been registered assigning a specific monitor serial # to you.  Billing and Patient Assistance Program Information  We have supplied [employer] with any of your insurance information on file for billing purposes. [employer] offers a sliding scale Patient Assistance Program for patients that do not have  insurance, or whose insurance does not completely cover the cost of the ZIO monitor.  You must apply for the Patient Assistance Program to qualify for this discounted rate.  To apply, please call [employer] at [phone], select option 4, select option 2, ask to apply for  Patient Assistance Program. Meredeth will ask your household income, and how many people  are in your household. They will quote your out-of-pocket cost based on that information.  [employer] will also be able to set up a 56-month, interest-free payment plan if needed.  Applying the monitor   Shave hair from upper left chest.  Hold abrader disc by orange tab. Rub abrader in 40 strokes over the upper left chest as  indicated in your monitor instructions.  Clean area with 4 enclosed alcohol pads. Let dry.  Apply patch as indicated in monitor instructions.  Patch will be placed under collarbone on left  side of chest with arrow pointing upward.  Rub patch adhesive wings for 2 minutes. Remove white label marked 1. Remove the white  label marked 2. Rub patch adhesive wings for 2 additional minutes.  While looking in a mirror, press and release button in center of patch. A small green light will  flash 3-4 times. This will be your only indicator that the monitor has been turned on.   Do not shower for the first 24 hours. You may shower after the first 24 hours.  Press the button if you feel a symptom. You will hear a small click. Record Date, Time and  Symptom in the Patient Logbook.  When you are ready to remove the patch, follow instructions on the last 2 pages of Patient  Logbook. Stick patch monitor onto the last page of Patient Logbook.  Place Patient Logbook in the blue and white box. Use locking tab on box and tape box closed  securely. The blue and white box has prepaid postage on it. Please place it in the mailbox as  soon as possible. Your physician should have your test results approximately 7 days after the  monitor has been mailed back to [facility].  Call [facility] Customer Care at [phone] if you have questions regarding  your ZIO XT patch monitor. Call them immediately if you see an orange light blinking on your  monitor.  If your monitor falls off in less than 4 days, contact our Monitor department at [phone].  If your monitor becomes loose or falls off after 4 days call [employer] at [phone] for  suggestions on securing your monitor    Follow-Up: At [facility], you and your health needs are our priority.  As part of our continuing mission to provide you with exceptional heart care, our providers are all part of one team.  This team includes your primary Cardiologist (physician) and Advanced Practice Providers or APPs (Physician Assistants and Nurse Practitioners) who all work together to provide you with the care you need, when you need it.  Your next appointment:   3 month(s)  Provider:   Dr. Kate     Thank you for choosing [facility]!!   [phone]   Check your blood pressure twice a day for the next week and let the office know your results.

## 2024-03-28 ENCOUNTER — Ambulatory Visit: Payer: Self-pay | Admitting: Cardiology

## 2024-03-28 DIAGNOSIS — I77819 Aortic ectasia, unspecified site: Secondary | ICD-10-CM

## 2024-03-28 DIAGNOSIS — I4430 Unspecified atrioventricular block: Secondary | ICD-10-CM

## 2024-03-28 LAB — BASIC METABOLIC PANEL WITH GFR
BUN/Creatinine Ratio: 18 (ref 10–24)
BUN: 19 mg/dL (ref 8–27)
CO2: 20 mmol/L (ref 20–29)
Calcium: 9.3 mg/dL (ref 8.6–10.2)
Chloride: 102 mmol/L (ref 96–106)
Creatinine, Ser: 1.04 mg/dL (ref 0.76–1.27)
Glucose: 90 mg/dL (ref 70–99)
Potassium: 4.2 mmol/L (ref 3.5–5.2)
Sodium: 140 mmol/L (ref 134–144)
eGFR: 72 mL/min/1.73 (ref >59)

## 2024-03-28 LAB — LIPID PANEL
Chol/HDL Ratio: 2.9 ratio (ref 0.0–5.0)
Cholesterol, Total: 138 mg/dL (ref 100–199)
HDL: 47 mg/dL (ref >39)
LDL Chol Calc (NIH): 42 mg/dL (ref 0–99)
Triglycerides: 333 mg/dL — ABNORMAL HIGH (ref 0–149)
VLDL Cholesterol Cal: 49 mg/dL — ABNORMAL HIGH (ref 5–40)

## 2024-03-28 LAB — MAGNESIUM: Magnesium: 2.1 mg/dL (ref 1.6–2.3)

## 2024-04-02 ENCOUNTER — Ambulatory Visit: Payer: Medicare Other | Admitting: Nurse Practitioner

## 2024-04-03 DIAGNOSIS — R42 Dizziness and giddiness: Secondary | ICD-10-CM | POA: Diagnosis not present

## 2024-04-03 DIAGNOSIS — R7303 Prediabetes: Secondary | ICD-10-CM | POA: Diagnosis not present

## 2024-04-03 DIAGNOSIS — I10 Essential (primary) hypertension: Secondary | ICD-10-CM | POA: Diagnosis not present

## 2024-04-18 DIAGNOSIS — R55 Syncope and collapse: Secondary | ICD-10-CM | POA: Diagnosis not present

## 2024-04-18 DIAGNOSIS — R42 Dizziness and giddiness: Secondary | ICD-10-CM | POA: Diagnosis not present

## 2024-05-02 ENCOUNTER — Ambulatory Visit (HOSPITAL_COMMUNITY)
Admission: RE | Admit: 2024-05-02 | Discharge: 2024-05-02 | Disposition: A | Discharge status: Home / Self Care | Source: Ambulatory Visit | Attending: Cardiology | Admitting: Cardiology

## 2024-05-02 DIAGNOSIS — R55 Syncope and collapse: Secondary | ICD-10-CM | POA: Diagnosis not present

## 2024-05-02 DIAGNOSIS — I10 Essential (primary) hypertension: Secondary | ICD-10-CM | POA: Insufficient documentation

## 2024-05-02 DIAGNOSIS — R42 Dizziness and giddiness: Secondary | ICD-10-CM | POA: Diagnosis not present

## 2024-05-02 LAB — ECHOCARDIOGRAM COMPLETE
Area-P 1/2: 2.62 cm2
S' Lateral: 3.3 cm

## 2024-05-19 DIAGNOSIS — R55 Syncope and collapse: Secondary | ICD-10-CM | POA: Diagnosis not present

## 2024-05-19 DIAGNOSIS — R42 Dizziness and giddiness: Secondary | ICD-10-CM | POA: Diagnosis not present

## 2024-06-16 ENCOUNTER — Encounter: Payer: Medicare Other | Admitting: Internal Medicine

## 2024-06-16 NOTE — Progress Notes (Unsigned)
 Cardiology Office Note:    Date:  06/18/2024   ID:  Cody Hamilton, DOB Dec 18, 1940, MRN [id number]  PCP:  Cody Lamarr RAMAN, MD  Cardiologist:  Cody LITTIE Nanas, MD  Electrophysiologist:  None   Referring MD: Cody Hamilton, *   Chief Complaint  Patient presents with   Dizziness    History of Present Illness:    Cody Hamilton is a 83 y.o. male with a hx of hypertension, hyperlipidemia, morbid obesity who presents for follow-up.  He was referred by Dr. Kip for evaluation of lightheadedness, initially seen 03/19/2024.  He reports lightheadedness initially occurred when he was waking up in the morning and getting out of bed, states that it felt like vertigo.  He was started on meclizine  but did not report improvement.  States that lightheadedness happens with standing.  Denies any syncope, but does report has had near syncopal episode.  He denies any chest pain, lower extremity edema.  States that he feels palpitations intermittently, last for short duration.  He smoked for over 40 years, up to nearly 2 packs/day, quit in 2004.  Family history includes father had CVA in 78s and his mother had CVA in 58s.  Echocardiogram 05/02/2024 showed EF 55 to 60%, normal RV function, ascending aortic aneurysm measuring 47 mm.  Zio patch x 14 days showed 2 pauses, longest lasting 3.3 seconds, 1 episode of NSVT lasting 4 beats and 21 episodes of SVT with longest lasting 15 seconds.  Since last clinic visit, he reports he is doing okay.  Reports he continues to have intermittent lightheadedness.  Denies any syncope.  States that about once per week will have episode of lightheadedness, reports occurs suddenly.  Denies any chest pain, dyspnea, lower extremity edema, or palpitations.   Past Medical History:  Diagnosis Date   Benign prostatic hyperplasia    BPH (benign prostatic hypertrophy)    Colon polyps    DDD (degenerative disc disease), lumbar    Decreased hearing 02/18/2016    Elevated hemoglobin A1c    Glaucoma    Hyperlipidemia 01/10/2011   Hypertension    Morbid obesity (HCC) 05/29/2022   Other abnormal glucose    Overweight (BMI 25.0-29.9) 06/08/2014   Vitamin D  deficiency     Past Surgical History:  Procedure Laterality Date   CARDIAC CATHETERIZATION  1999   INGUINAL HERNIA REPAIR     LAPAROSCOPIC APPENDECTOMY      Current Medications: Current Meds  Medication Sig   aspirin 81 MG EC tablet Take 81 mg by mouth daily. (Patient taking differently: Take 81 mg by mouth every other day.)   Cholecalciferol (VITAMIN D3) 10000 UNITS capsule Take 10,000 Units by mouth daily.   enalapril (VASOTEC) 20 MG tablet TAKE 1 TABLET BY MOUTH DAILY FOR BLOOD PRESSURE (Patient taking differently: Take 10 mg by mouth daily. TAKE 1 TABLET BY MOUTH DAILY FOR BLOOD PRESSURE)   latanoprost (XALATAN) 0.005 % ophthalmic solution Place 1 drop into both eyes at bedtime.   magnesium oxide (MAG-OX) 400 MG tablet Take 400 mg by mouth 2 (two) times daily.   Multiple Vitamin (MULTIVITAMIN) tablet Take 1 tablet by mouth daily.   Omega-3 Fatty Acids (FISH OIL) 1200 MG CAPS Take 1 capsule by mouth 2 (two) times daily.   rosuvastatin  (CRESTOR ) 10 MG tablet TAKE 1 TABLET BY MOUTH DAILY FOR CHOLESTEROL   tamsulosin  (FLOMAX ) 0.4 MG CAPS capsule Take  1 capsule at Bedtime for Prostate                                                                         /  TAKE                                         BY                                                 MOUTH   vitamin C (ASCORBIC ACID) 500 MG tablet Take 500 mg by mouth daily.     Allergies:   Horse-derived products and Viagra [sildenafil citrate]   Social History   Socioeconomic History   Marital status: Married    Spouse name: Not on file   Number of children: Not on file   Years of education: Not on file   Highest education level: Not on file  Occupational History    Not on file  Tobacco Use   Smoking status: Former    Current packs/day: 0.00    Types: Cigarettes    Quit date: 07/31/2001    Years since quitting: 22.8   Smokeless tobacco: Never  Substance and Sexual Activity   Alcohol use: No   Drug use: No   Sexual activity: Not on file  Other Topics Concern   Not on file  Social History Narrative   NO FAMILY HISTORY IN [place]   Social Drivers of Health   Financial Resource Strain: Not on file  Food Insecurity: Not on file  Transportation Needs: Not on file  Physical Activity: Not on file  Stress: Not on file  Social Connections: Not on file     Family History: The patient's family history includes Alzheimer's disease in his mother; Cancer in his father; Hyperlipidemia in his father and sister; Hypertension in his father, mother, and sister; Lymphoma in his father.  ROS:   Please see the history of present illness.     All other systems reviewed and are negative.  EKGs/Labs/Other Studies Reviewed:    The following studies were reviewed today:   EKG:   03/27/2024: Sinus bradycardia, rate 58, no ST abnormalities  Recent Labs: 03/27/2024: BUN 19; Creatinine, Ser 1.04; Magnesium 2.1; Potassium 4.2; Sodium 140  Recent Lipid Panel    Component Value Date/Time   CHOL 138 03/27/2024 1620   TRIG 333 (H) 03/27/2024 1620   HDL 47 03/27/2024 1620   CHOLHDL 2.9 03/27/2024 1620   CHOLHDL 2.5 06/07/2023 1100   VLDL 22 12/26/2016 1134   LDLCALC 42 03/27/2024 1620   LDLCALC 60 06/07/2023 1100    Physical Exam:    VS:  BP 126/60 (BP Location: Left Arm, Patient Position: Sitting, Cuff Size: Large)   Pulse 77   Ht 6' 3 (1.905 m)   Wt 237 lb 3.2 oz (107.6 kg)   BMI 29.65 kg/m     Wt Readings from Last 3 Encounters:  06/18/24 237 lb 3.2 oz (107.6 kg)  03/27/24 248 lb 3.2 oz (112.6 kg)  06/07/23 242 lb 3.2 oz (109.9 kg)     GEN:  Well nourished, well developed in no acute distress HEENT: Normal NECK: No JVD; No carotid  bruits LYMPHATICS: No lymphadenopathy CARDIAC: RRR, no murmurs, rubs, gallops RESPIRATORY:  Clear to auscultation without rales, wheezing or rhonchi  ABDOMEN: Soft, non-tender, non-distended MUSCULOSKELETAL:  No edema; No deformity  SKIN: Warm and dry NEUROLOGIC:  Alert and oriented x 3  PSYCHIATRIC:  Normal affect   ASSESSMENT:    1. Lightheadedness   2. Aneurysm of ascending aorta without rupture   3. Hypertension, unspecified type   4. Hyperlipidemia, unspecified hyperlipidemia type     PLAN:    Lightheadedness/near syncope: Orthostatics at prior clinic visit did show about 20 point drop in SBP with standing.  Discontinued HCTZ.  Suspect orthostasis contributing but he continues to report episodes of lightheadedness that occur suddenly.  Echocardiogram 05/02/2024 showed EF 55 to 60%, normal RV function, ascending aortic aneurysm measuring 47 mm.  Zio patch x 14 days showed 2 pauses, longest lasting 3.3 seconds, 1 episode of NSVT lasting 4 beats and 21 episodes of SVT with longest lasting 15 seconds. - There were 2 pauses on his cardiac monitor, 1 occurred during the day and appeared possible high degree AV block.  Given his symptoms of lightheadedness and with this finding on his heart monitor, recommend referral to EP.  Has upcoming appointment with Dr Nancey  Ascending aortic aneurysm: Measured 47 mm on echocardiogram 04/2024.  Plan CTA chest in 6 months to follow  Hypertension: On enalapril 20 mg daily.  Appears controlled  Hyperlipidemia: On rosuvastatin  10 mg daily.  LDL 42 on 02/2024  RTC in 6 months  Medication Adjustments/Labs and Tests Ordered: Current medicines are reviewed at length with the patient today.  Concerns regarding medicines are outlined above.  No orders of the defined types were placed in this encounter.  No orders of the defined types were placed in this encounter.   Patient Instructions  Medication Instructions:  No medication changes were made at  this visit. Continue current regimen.   *If you need a refill on your cardiac medications before your next appointment, please call your pharmacy*  Lab Work: None ordered today. If you have labs (blood work) drawn today and your tests are completely normal, you will receive your results only by: MyChart Message (if you have MyChart) OR A paper copy in the mail If you have any lab test that is abnormal or we need to change your treatment, we will call you to review the results.  Testing/Procedures: None ordered today.  Follow-Up: At [facility], you and your health needs are our priority.  As part of our continuing mission to provide you with exceptional heart care, our providers are all part of one team.  This team includes your primary Cardiologist (physician) and Advanced Practice Providers or APPs (Physician Assistants and Nurse Practitioners) who all work together to provide you with the care you need, when you need it.  Your next appointment:   6 month(s)  Provider:   [name], MD      Signed, Cody LITTIE Nanas, MD  06/18/2024 11:37 PM    [facility]

## 2024-06-18 ENCOUNTER — Ambulatory Visit: Attending: Cardiology | Admitting: Cardiology

## 2024-06-18 VITALS — BP 126/60 | HR 77 | Ht 75.0 in | Wt 237.2 lb

## 2024-06-18 DIAGNOSIS — I10 Essential (primary) hypertension: Secondary | ICD-10-CM | POA: Diagnosis not present

## 2024-06-18 DIAGNOSIS — I7121 Aneurysm of the ascending aorta, without rupture: Secondary | ICD-10-CM

## 2024-06-18 DIAGNOSIS — E785 Hyperlipidemia, unspecified: Secondary | ICD-10-CM

## 2024-06-18 DIAGNOSIS — R42 Dizziness and giddiness: Secondary | ICD-10-CM | POA: Diagnosis not present

## 2024-06-18 NOTE — Patient Instructions (Signed)
 Medication Instructions:  No medication changes were made at this visit. Continue current regimen.   *If you need a refill on your cardiac medications before your next appointment, please call your pharmacy*  Lab Work: None ordered today. If you have labs (blood work) drawn today and your tests are completely normal, you will receive your results only by: MyChart Message (if you have MyChart) OR A paper copy in the mail If you have any lab test that is abnormal or we need to change your treatment, we will call you to review the results.  Testing/Procedures: None ordered today.  Follow-Up: At [facility], you and your health needs are our priority.  As part of our continuing mission to provide you with exceptional heart care, our providers are all part of one team.  This team includes your primary Cardiologist (physician) and Advanced Practice Providers or APPs (Physician Assistants and Nurse Practitioners) who all work together to provide you with the care you need, when you need it.  Your next appointment:   6 month(s)  Provider:   [name], MD

## 2024-07-07 ENCOUNTER — Encounter: Payer: Medicare Other | Admitting: Internal Medicine

## 2024-07-10 ENCOUNTER — Encounter: Payer: Self-pay | Admitting: Cardiovascular Disease

## 2024-07-10 ENCOUNTER — Ambulatory Visit: Attending: Cardiovascular Disease | Admitting: Cardiovascular Disease

## 2024-07-10 VITALS — BP 146/73 | HR 72 | Resp 16 | Ht 75.0 in | Wt 234.8 lb

## 2024-07-10 DIAGNOSIS — I4430 Unspecified atrioventricular block: Secondary | ICD-10-CM

## 2024-07-10 DIAGNOSIS — R55 Syncope and collapse: Secondary | ICD-10-CM | POA: Diagnosis not present

## 2024-07-10 NOTE — Patient Instructions (Signed)
 Medication Instructions:  Your physician recommends that you continue on your current medications as directed. Please refer to the Current Medication list given to you today.  *If you need a refill on your cardiac medications before your next appointment, please call your pharmacy*  Lab Work: None ordered.  If you have labs (blood work) drawn today and your tests are completely normal, you will receive your results only by: MyChart Message (if you have MyChart) OR A paper copy in the mail If you have any lab test that is abnormal or we need to change your treatment, we will call you to review the results.  Testing/Procedures: Dr Nancey would like to implant an Internal Loop Recorder  Follow-Up: At [facility], you and your health needs are our priority.  As part of our continuing mission to provide you with exceptional heart care, our providers are all part of one team.  This team includes your primary Cardiologist (physician) and Advanced Practice Providers or APPs (Physician Assistants and Nurse Practitioners) who all work together to provide you with the care you need, when you need it.  Your next appointment:   12 month(s)  Provider:   You will see one of the following Advanced Practice Providers on your designated Care Team:   Charlies Arthur, [place] Ozell Jodie Passey, PA-C Suzann Riddle, NP Daphne Barrack, NP Artist Pouch, PA-C     We recommend signing up for the patient portal called MyChart.  Sign up information is provided on this After Visit Summary.  MyChart is used to connect with patients for Virtual Visits (Telemedicine).  Patients are able to view lab/test results, encounter notes, upcoming appointments, etc.  Non-urgent messages can be sent to your provider as well.   To learn more about what you can do with MyChart, go to [email or web address].   Other Instructions Please let us  know if you decide to move forward with Loop Recorder

## 2024-07-10 NOTE — Progress Notes (Signed)
°  Electrophysiology Office Note:    Date:  07/10/2024   ID:  Cody Hamilton, DOB 18-Jun-1941, MRN [id number]  PCP:  Cody Lamarr RAMAN, MD   [facility] Providers Cardiologist:  Cody Hamilton Nanas, MD     Referring MD: Hamilton Cody LITTIE, MD   History of Present Illness:    Cody Hamilton is a 83 y.o. male with a medical history significant for hypertension, hyperlipidemia, morbid obesity, referred for evaluation of pauses on monitor.      Discussed the use of AI scribe software for clinical note transcription with the patient, who gave verbal consent to proceed.  History of Present Illness Cody Hamilton is an 83 year old male who presents with episodes of lightheadedness.  He reports intermittent lightheadedness starting after cataract surgery in June of last year. Symptoms occur primarily when standing and cause unsteadiness. He has no symptoms while sitting.  He wore a monitor and triggered it once during an episode of shortness of breath while moving furniture. The recording showed brief pauses, up to 3.3 seconds, not associated with symptoms.  Years ago, he had dizziness and lightheadedness after a heavy meal with nausea and vomiting, was hospitalized, and had a catheterization that was normal. He has not had any episodes of passing out since that time and currently has only dizziness and lightheadedness with standing, without syncope.         Today, he reports he feels well and has no acute complaints  EKGs/Labs/Other Studies Reviewed Today:     Echocardiogram:  TTE May 02, 2024 LVEF 55 to 60%.  Grade 1 diastolic dysfunction.  Mildly dilated left and right atrium.  Normal aortic and mitral valve structure and function   Monitors:  14 day monitor September 2025-- my interpretation Sinus rhythm heart rate 35 to 113 bpm, average 59 beats minute There were occasional pauses, longest 3.3 seconds, some occurring during daytime 1  symptom episode of lightheadedness correlated with sinus rhythm    EKG:         Physical Exam:    VS:  BP (!) 146/73 (BP Location: Left Arm, Patient Position: Sitting, Cuff Size: Normal)   Pulse 72   Resp 16   Ht 6' 3 (1.905 m)   Wt 234 lb 12.8 oz (106.5 kg)   SpO2 96%   BMI 29.35 kg/m     Wt Readings from Last 3 Encounters:  07/10/24 234 lb 12.8 oz (106.5 kg)  06/18/24 237 lb 3.2 oz (107.6 kg)  03/27/24 248 lb 3.2 oz (112.6 kg)     GEN: Well nourished, well developed in no acute distress CARDIAC: RRR, no murmurs, rubs, gallops RESPIRATORY:  Normal work of breathing MUSCULOSKELETAL: no edema    ASSESSMENT & PLAN:     Lightheadedness, dizziness Brief pauses documented on wearable monitor -up to 3.3 seconds These appear to show both sinus node slowing and 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  I explained the risks of the loop recorder placement and the monitoring fee.  Hypertension BP slightly elevated today On enalapril 20      Signed, Cody Hamilton [place], MD  07/10/2024 10:56 AM    [facility]
